# Patient Record
Sex: Male | Born: 1935 | Race: White | Hispanic: No | Marital: Married | Smoking: Former smoker
Health system: Southern US, Community
[De-identification: ages and names within clinical notes are randomized; demographics above are authoritative.]

## PROBLEM LIST (undated history)

## (undated) DIAGNOSIS — I2510 Atherosclerotic heart disease of native coronary artery without angina pectoris: Secondary | ICD-10-CM

## (undated) DIAGNOSIS — I10 Essential (primary) hypertension: Secondary | ICD-10-CM

## (undated) DIAGNOSIS — I350 Nonrheumatic aortic (valve) stenosis: Secondary | ICD-10-CM

## (undated) DIAGNOSIS — D469 Myelodysplastic syndrome, unspecified: Secondary | ICD-10-CM

## (undated) DIAGNOSIS — E119 Type 2 diabetes mellitus without complications: Secondary | ICD-10-CM

## (undated) DIAGNOSIS — I255 Ischemic cardiomyopathy: Secondary | ICD-10-CM

## (undated) HISTORY — DX: Ischemic cardiomyopathy: I25.5

## (undated) HISTORY — DX: Atherosclerotic heart disease of native coronary artery without angina pectoris: I25.10

---

## 2014-05-27 ENCOUNTER — Emergency Department: Payer: Self-pay | Admitting: Emergency Medicine

## 2014-05-27 LAB — CBC
HCT: 39.8 % — ABNORMAL LOW (ref 40.0–52.0)
HGB: 13 g/dL (ref 13.0–18.0)
MCH: 31.7 pg (ref 26.0–34.0)
MCHC: 32.6 g/dL (ref 32.0–36.0)
MCV: 97 fL (ref 80–100)
Platelet: 269 10*3/uL (ref 150–440)
RBC: 4.09 10*6/uL — ABNORMAL LOW (ref 4.40–5.90)
RDW: 14.1 % (ref 11.5–14.5)
WBC: 6.6 10*3/uL (ref 3.8–10.6)

## 2014-05-27 LAB — URINALYSIS, COMPLETE
BILIRUBIN, UR: NEGATIVE
BLOOD: NEGATIVE
Bacteria: NONE SEEN
GLUCOSE, UR: NEGATIVE mg/dL (ref 0–75)
KETONE: NEGATIVE
LEUKOCYTE ESTERASE: NEGATIVE
NITRITE: NEGATIVE
PH: 6 (ref 4.5–8.0)
PROTEIN: NEGATIVE
SPECIFIC GRAVITY: 1.009 (ref 1.003–1.030)
SQUAMOUS EPITHELIAL: NONE SEEN
WBC UR: <1 /HPF (ref 0–5)

## 2014-05-27 LAB — COMPREHENSIVE METABOLIC PANEL
ALBUMIN: 3.5 g/dL (ref 3.4–5.0)
ANION GAP: 6 — AB (ref 7–16)
Alkaline Phosphatase: 113 U/L
BUN: 18 mg/dL (ref 7–18)
Bilirubin,Total: 0.7 mg/dL (ref 0.2–1.0)
CALCIUM: 9.1 mg/dL (ref 8.5–10.1)
CHLORIDE: 106 mmol/L (ref 98–107)
CO2: 28 mmol/L (ref 21–32)
CREATININE: 1.24 mg/dL (ref 0.60–1.30)
EGFR (African American): >60
GFR CALC NON AF AMER: 59 — AB
GLUCOSE: 166 mg/dL — AB (ref 65–99)
Osmolality: 285 (ref 275–301)
Potassium: 3.8 mmol/L (ref 3.5–5.1)
SGOT(AST): 25 U/L (ref 15–37)
SGPT (ALT): 23 U/L
Sodium: 140 mmol/L (ref 136–145)
Total Protein: 7.2 g/dL (ref 6.4–8.2)

## 2014-05-27 LAB — PROTIME-INR
INR: 1.1
PROTHROMBIN TIME: 14.3 s (ref 11.5–14.7)

## 2014-05-27 LAB — APTT: Activated PTT: 29.5 secs (ref 23.6–35.9)

## 2014-05-27 LAB — TROPONIN I

## 2014-10-30 ENCOUNTER — Emergency Department: Payer: Self-pay | Admitting: Emergency Medicine

## 2017-08-13 ENCOUNTER — Emergency Department: Payer: Medicare Other

## 2017-08-13 ENCOUNTER — Other Ambulatory Visit: Payer: Self-pay

## 2017-08-13 ENCOUNTER — Encounter: Payer: Self-pay | Admitting: Emergency Medicine

## 2017-08-13 ENCOUNTER — Emergency Department
Admission: EM | Admit: 2017-08-13 | Discharge: 2017-08-13 | Disposition: A | Discharge status: Home / Self Care | Payer: Medicare Other | Source: Home / Self Care | Attending: Emergency Medicine | Admitting: Emergency Medicine

## 2017-08-13 DIAGNOSIS — Z79899 Other long term (current) drug therapy: Secondary | ICD-10-CM

## 2017-08-13 DIAGNOSIS — I10 Essential (primary) hypertension: Secondary | ICD-10-CM

## 2017-08-13 DIAGNOSIS — J189 Pneumonia, unspecified organism: Secondary | ICD-10-CM | POA: Insufficient documentation

## 2017-08-13 DIAGNOSIS — E119 Type 2 diabetes mellitus without complications: Secondary | ICD-10-CM

## 2017-08-13 DIAGNOSIS — Z791 Long term (current) use of non-steroidal anti-inflammatories (NSAID): Secondary | ICD-10-CM

## 2017-08-13 DIAGNOSIS — Z87891 Personal history of nicotine dependence: Secondary | ICD-10-CM | POA: Insufficient documentation

## 2017-08-13 HISTORY — DX: Myelodysplastic syndrome, unspecified: D46.9

## 2017-08-13 LAB — COMPREHENSIVE METABOLIC PANEL
ALT: 12 U/L — ABNORMAL LOW (ref 17–63)
ANION GAP: 7 (ref 5–15)
AST: 29 U/L (ref 15–41)
Albumin: 3.8 g/dL (ref 3.5–5.0)
Alkaline Phosphatase: 98 U/L (ref 38–126)
BUN: 20 mg/dL (ref 6–20)
CHLORIDE: 103 mmol/L (ref 101–111)
CO2: 26 mmol/L (ref 22–32)
Calcium: 9.3 mg/dL (ref 8.9–10.3)
Creatinine, Ser: 1 mg/dL (ref 0.61–1.24)
Glucose, Bld: 120 mg/dL — ABNORMAL HIGH (ref 65–99)
POTASSIUM: 4.8 mmol/L (ref 3.5–5.1)
Sodium: 136 mmol/L (ref 135–145)
TOTAL PROTEIN: 7.3 g/dL (ref 6.5–8.1)
Total Bilirubin: 1 mg/dL (ref 0.3–1.2)

## 2017-08-13 LAB — CBC
HCT: 30.1 % — ABNORMAL LOW (ref 40.0–52.0)
Hemoglobin: 10 g/dL — ABNORMAL LOW (ref 13.0–18.0)
MCH: 36.1 pg — AB (ref 26.0–34.0)
MCHC: 33.1 g/dL (ref 32.0–36.0)
MCV: 109.1 fL — AB (ref 80.0–100.0)
PLATELETS: 119 10*3/uL — AB (ref 150–440)
RBC: 2.76 MIL/uL — AB (ref 4.40–5.90)
RDW: 18 % — AB (ref 11.5–14.5)
WBC: 1.9 10*3/uL — ABNORMAL LOW (ref 3.8–10.6)

## 2017-08-13 LAB — TROPONIN I

## 2017-08-13 LAB — INFLUENZA PANEL BY PCR (TYPE A & B)
INFLAPCR: NEGATIVE
INFLBPCR: NEGATIVE

## 2017-08-13 MED ORDER — AZITHROMYCIN 250 MG PO TABS: ORAL_TABLET | ORAL | 0 refills | Status: DC

## 2017-08-13 MED ORDER — HALOPERIDOL LACTATE 5 MG/ML IJ SOLN
2.0000 mg | Freq: Once | INTRAMUSCULAR | Status: AC
  Administered 2017-08-13: 2 mg via INTRAVENOUS
  Filled 2017-08-13: qty 1

## 2017-08-13 MED ORDER — IBUPROFEN 600 MG PO TABS
600.0000 mg | ORAL_TABLET | Freq: Once | ORAL | Status: AC
  Administered 2017-08-13: 600 mg via ORAL
  Filled 2017-08-13: qty 1

## 2017-08-13 MED ORDER — ALBUTEROL SULFATE (2.5 MG/3ML) 0.083% IN NEBU
5.0000 mg | INHALATION_SOLUTION | Freq: Once | RESPIRATORY_TRACT | Status: AC
  Administered 2017-08-13: 5 mg via RESPIRATORY_TRACT

## 2017-08-13 MED ORDER — ACETAMINOPHEN 500 MG PO TABS
1000.0000 mg | ORAL_TABLET | Freq: Once | ORAL | Status: AC
Start: 2017-08-13 — End: 2017-08-13
  Administered 2017-08-13: 1000 mg via ORAL
  Filled 2017-08-13: qty 2

## 2017-08-13 MED ORDER — IOPAMIDOL (ISOVUE-370) INJECTION 76%
75.0000 mL | Freq: Once | INTRAVENOUS | Status: AC | PRN
  Administered 2017-08-13: 75 mL via INTRAVENOUS

## 2017-08-13 MED ORDER — ALBUTEROL SULFATE (2.5 MG/3ML) 0.083% IN NEBU
INHALATION_SOLUTION | RESPIRATORY_TRACT | Status: AC
  Administered 2017-08-13: 5 mg via RESPIRATORY_TRACT
  Filled 2017-08-13: qty 6

## 2017-08-13 NOTE — ED Triage Notes (Signed)
Patient noted in triage to try to cough up phlegm, weak cough effort and struggled for minute to be able to clear airway. Resolved without suction.

## 2017-08-13 NOTE — ED Notes (Signed)
ED Provider at bedside. 

## 2017-08-13 NOTE — ED Notes (Signed)
Patient transported to CT 

## 2017-08-13 NOTE — Discharge Instructions (Signed)
Please take all of your antibiotics as prescribed and follow-up with your primary care physician as needed.  Please make sure you remain well-hydrated and return to the emergency department for any concerns such as fevers, chills, worsening shortness of breath if you cannot eat or drink, or for any other issues whatsoever.  It was a pleasure to take care of you today, and thank you for coming to our emergency department.  If you have any questions or concerns before leaving please ask the nurse to grab me and I'm more than happy to go through your aftercare instructions again.  If you were prescribed any opioid pain medication today such as Norco, Vicodin, Percocet, morphine, hydrocodone, or oxycodone please make sure you do not drive when you are taking this medication as it can alter your ability to drive safely.  If you have any concerns once you are home that you are not improving or are in fact getting worse before you can make it to your follow-up appointment, please do not hesitate to call 911 and come back for further evaluation.  Darel Hong, MD  Results for orders placed or performed during the hospital encounter of 08/13/17  CBC  Result Value Ref Range   WBC 1.9 (L) 3.8 - 10.6 K/uL   RBC 2.76 (L) 4.40 - 5.90 MIL/uL   Hemoglobin 10.0 (L) 13.0 - 18.0 g/dL   HCT 30.1 (L) 40.0 - 52.0 %   MCV 109.1 (H) 80.0 - 100.0 fL   MCH 36.1 (H) 26.0 - 34.0 pg   MCHC 33.1 32.0 - 36.0 g/dL   RDW 18.0 (H) 11.5 - 14.5 %   Platelets 119 (L) 150 - 440 K/uL  Troponin I  Result Value Ref Range   Troponin I <0.03 <0.03 ng/mL  Comprehensive metabolic panel  Result Value Ref Range   Sodium 136 135 - 145 mmol/L   Potassium 4.8 3.5 - 5.1 mmol/L   Chloride 103 101 - 111 mmol/L   CO2 26 22 - 32 mmol/L   Glucose, Bld 120 (H) 65 - 99 mg/dL   BUN 20 6 - 20 mg/dL   Creatinine, Ser 1.00 0.61 - 1.24 mg/dL   Calcium 9.3 8.9 - 10.3 mg/dL   Total Protein 7.3 6.5 - 8.1 g/dL   Albumin 3.8 3.5 - 5.0 g/dL   AST 29 15 - 41 U/L   ALT 12 (L) 17 - 63 U/L   Alkaline Phosphatase 98 38 - 126 U/L   Total Bilirubin 1.0 0.3 - 1.2 mg/dL   GFR calc non Af Amer >60 >60 mL/min   GFR calc Af Amer >60 >60 mL/min   Anion gap 7 5 - 15  Influenza panel by PCR (type A & B)  Result Value Ref Range   Influenza A By PCR NEGATIVE NEGATIVE   Influenza B By PCR NEGATIVE NEGATIVE   Dg Chest 2 View  Result Date: 08/13/2017 CLINICAL DATA:  84 year old male with a history of shortness of breath EXAM: CHEST  2 VIEW COMPARISON:  10/30/2014, 05/27/2014 FINDINGS: Cardiomediastinal silhouette unchanged in size and contour. No evidence of central vascular congestion. Compare to the prior there is increased interstitial opacities, particularly at the right lung base. No pneumothorax. No confluent airspace disease. No pleural effusion. Coronary calcifications. IMPRESSION: Coarse interstitial markings may reflect either early edema, chronic scarring, or potentially atypical infection. No evidence of lobar pneumonia. Electronically Signed   By: Corrie Mckusick D.O.   On: 08/13/2017 14:42   Ct Angio Chest  Pe W/cm &/or Wo Cm  Result Date: 08/13/2017 CLINICAL DATA:  Flu like symptoms for the past 3 days with nonproductive an week cough. Worsening shortness of breath overnight. Evaluate pulmonary embolism. EXAM: CT ANGIOGRAPHY CHEST WITH CONTRAST TECHNIQUE: Multidetector CT imaging of the chest was performed using the standard protocol during bolus administration of intravenous contrast. Multiplanar CT image reconstructions and MIPs were obtained to evaluate the vascular anatomy. CONTRAST:  20mL ISOVUE-370 IOPAMIDOL (ISOVUE-370) INJECTION 76% COMPARISON:  Chest x-ray from same day. FINDINGS: Cardiovascular: Satisfactory opacification of the pulmonary arteries to the segmental level. No evidence of pulmonary embolism. Normal heart size. No pericardial effusion. Normal caliber thoracic aorta. Coronary, aortic arch, and branch vessel atherosclerotic  vascular disease. Mediastinum/Nodes: Multiple subcentimeter mediastinal lymph nodes are likely reactive. No enlarged hilar, or axillary lymph nodes. Mildly patulous esophagus. Thyroid gland and trachea demonstrate no significant findings. Lungs/Pleura: Patchy increased peribronchovascular nodularity and interlobular septal thickening in the right greater than left lower lobes. No focal consolidation, pleural effusion, or pneumothorax. Upper Abdomen: No acute abnormality. Musculoskeletal: No chest wall abnormality. No acute or significant osseous findings. Degenerative changes of the thoracic spine. Review of the MIP images confirms the above findings. IMPRESSION: 1. No evidence of pulmonary embolism. 2. Patchy increased peribronchovascular nodularity in the right greater than left lower lobes, likely related to atypical infection or aspiration, especially in the setting of a somewhat patulous esophagus. 3.  Aortic atherosclerosis (ICD10-I70.0). Electronically Signed   By: Titus Dubin M.D.   On: 08/13/2017 16:36

## 2017-08-13 NOTE — ED Provider Notes (Signed)
[facility] Emergency Department Provider Note  ____________________________________________   First MD Initiated Contact with Patient 08/13/17 1459     (approximate)  I have reviewed the triage vital signs and the nursing notes.   HISTORY  Chief Complaint Cough and Chest Pain   HPI Terry Gonzales is a 84 y.o. male who comes to the emergency department with 3 days of upper respiratory tract symptoms.  He has had nasal congestion and dry cough.  Patient was concerned he might have influenza so he has been taking amoxicillin that he had left around the house without improvement in his symptoms.  He has had subjective fever but no chills.  He also reports sore throat.  He has a past medical history of myelodysplastic syndrome.  He has mild diffuse upper nonradiating chest pain worse with cough improved when not coughing.  He has no pulmonary issues at baseline.  Past Medical History:  Diagnosis Date  . Diabetes mellitus without complication ([place])   . Hypertension   . Myelodysplastic syndrome [facility])     Patient Active Problem List   Diagnosis Date Noted  . Pneumonia 08/14/2017    History reviewed. No pertinent surgical history.  Prior to Admission medications   Medication Sig Start Date End Date Taking? Authorizing Provider  azithromycin (ZITHROMAX Z-PAK) 250 MG tablet Take 2 tablets (500 mg) on  Day 1,  followed by 1 tablet (250 mg) once daily on Days 2 through 5. 08/13/17   Darel Hong, MD  lisinopril (PRINIVIL,ZESTRIL) 20 MG tablet Take 20 mg by mouth daily. 05/06/17 05/06/18  [provider]  Multiple Vitamin (MULTI-VITAMINS) TABS Take 1 tablet by mouth daily. 02/24/03   [provider]  naproxen sodium (ALEVE) 220 MG tablet Take 220 mg by mouth daily. 02/24/03   [provider]  Omega-3 1000 MG CAPS Take 1,000 mg by mouth daily. 09/18/11   [provider]  simvastatin (ZOCOR) 10 MG tablet Take 10 mg by mouth daily.  05/06/17   [provider]    Allergies Penicillins  No family history on file.  Social History Social History   Tobacco Use  . Smoking status: Former Smoker  Substance Use Topics  . Alcohol use: No    Frequency: Never  . Drug use: Not on file    Review of Systems Constitutional: Positive for subjective fever Eyes: No visual changes. ENT: Positive for sore throat. Cardiovascular: Positive for chest pain. Respiratory: Positive for shortness of breath. Gastrointestinal: No abdominal pain.  No nausea, no vomiting.  No diarrhea.  No constipation. Genitourinary: Negative for dysuria. Musculoskeletal: Negative for back pain. Skin: Negative for rash. Neurological: Negative for headaches, focal weakness or numbness.   ____________________________________________   PHYSICAL EXAM:  VITAL SIGNS: ED Triage Vitals  Enc Vitals Group     BP 08/13/17 1354 (!) 143/72     Pulse Rate 08/13/17 1354 76     Resp --      Temp 08/13/17 1354 98.3 F (36.8 C)     Temp Source 08/13/17 1354 Oral     SpO2 08/13/17 1354 96 %     Weight 08/13/17 1355 160 lb (72.6 kg)     Height 08/13/17 1355 6' (1.829 m)     Head Circumference --      Peak Flow --      Pain Score 08/13/17 1354 10     Pain Loc --      Pain Edu? --      Excl.  in [place]? --     Constitutional: Appears to be having a panic attack hyperventilating tearful speaking in short sentences Eyes: PERRL EOMI. Head: Atraumatic. Nose: No congestion/rhinnorhea. Mouth/Throat: No trismus Neck: No stridor.   Cardiovascular: Tachycardic rate, regular rhythm. Grossly normal heart sounds.  Good peripheral circulation. Respiratory: Normal respiratory effort.  No retractions. Lungs CTAB and moving good air lungs are completely clear Gastrointestinal: Soft nontender Musculoskeletal: No lower extremity edema   Neurologic:  Normal speech and language. No gross focal neurologic deficits are appreciated. Skin:  Skin is warm, dry and  intact. No rash noted. Psychiatric: Anxious appearing    ____________________________________________   DIFFERENTIAL includes but not limited to  Pneumonia, pneumothorax, pulmonary embolism, influenza, influenza-like illness ____________________________________________   LABS (all labs ordered are listed, but only abnormal results are displayed)  Labs Reviewed  CBC - Abnormal; Notable for the following components:      Result Value   WBC 1.9 (*)    RBC 2.76 (*)    Hemoglobin 10.0 (*)    HCT 30.1 (*)    MCV 109.1 (*)    MCH 36.1 (*)    RDW 18.0 (*)    Platelets 119 (*)    All other components within normal limits  COMPREHENSIVE METABOLIC PANEL - Abnormal; Notable for the following components:   Glucose, Bld 120 (*)    ALT 12 (*)    All other components within normal limits  TROPONIN I  INFLUENZA PANEL BY PCR (TYPE A & B)    Lab work reviewed by me with slightly low white count which is consistent with his known myelodysplastic syndrome  EKG __________________________________________  ED ECG REPORT I, Darel Hong, the attending physician, personally viewed and interpreted this ECG.  Date: 08/13/2017 EKG Time:  Rate: 76 Rhythm: normal sinus rhythm QRS Axis: normal Intervals: normal ST/T Wave abnormalities: normal Narrative Interpretation: Significant amount of artifact makes interpretation of the EKG difficult with the patient appears to be in normal sinus rhythm with no acute ischemia  ____________________________________________  RADIOLOGY  Chest x-ray reviewed by me is nonspecific but could be consistent with infection ____________ CT pulmonary angiogram consistent with atypical infection ________________________________   PROCEDURES  Procedure(s) performed: no  Procedures  Critical Care performed: no  Observation: no ____________________________________________   INITIAL IMPRESSION / ASSESSMENT AND PLAN / ED COURSE  Pertinent labs &  imaging results that were available during my care of the patient were reviewed by me and considered in my medical decision making (see chart for details).  On arrival the patient appears to be having a panic attack.  Given 2 mg of haloperidol intravenously with improvement in his symptoms.  I think his anxiety is exacerbated by the albuterol he was given in triage.  Chest x-ray is nonspecific so CT pulmonary angiogram is pending.  CT is most concerning for atypical infection.  As the patient has a low-grade fever think is reasonable to treat him for possible pneumonia.  He does not have influenza.  I had a lengthy discussion with the patient and family at bedside regarding the diagnostic uncertainty and that it should he not improve with antibiotics he is to return for IV antibiotics and further care.  The patient is family verbalized understanding agreement the plan      ____________________________________________   FINAL CLINICAL IMPRESSION(S) / ED DIAGNOSES  Final diagnoses:  Community acquired pneumonia, unspecified laterality      NEW MEDICATIONS STARTED DURING THIS VISIT:  This SmartLink is deprecated. Use  AVSMEDLIST instead to display the medication list for a patient.   Note:  This document was prepared using Dragon voice recognition software and may include unintentional dictation errors.     Darel Hong, MD 08/14/17 2205

## 2017-08-13 NOTE — ED Triage Notes (Signed)
Sore throat cough and chest pain began 2 days ago.

## 2017-08-13 NOTE — ED Notes (Signed)
Pt daughter out to desk, states patient is crying and cannot breathe. Pt appears anxious when entering room. Reassured that oxygen is 100% on RA. Placed on 2L [place] for comfort. EDP notified.

## 2017-08-13 NOTE — ED Notes (Signed)
Report to Ashley, RN

## 2017-08-13 NOTE — ED Triage Notes (Signed)
First nurse:  Sick with cough for several days.

## 2017-08-13 NOTE — ED Notes (Signed)
Flu like sx x 3 days, nonproductive and weak cough. SOB worsening overnight. Pt reporting lower center chest pain. Pt alert and oriented X4, active, cooperative, pt in NAD. Color WNL.

## 2017-08-14 ENCOUNTER — Emergency Department: Payer: Medicare Other

## 2017-08-14 ENCOUNTER — Inpatient Hospital Stay
Admission: EM | Admit: 2017-08-14 | Discharge: 2017-08-19 | DRG: 871 | Disposition: A | Discharge status: Home Health | Payer: Medicare Other | Attending: Internal Medicine | Admitting: Internal Medicine

## 2017-08-14 ENCOUNTER — Other Ambulatory Visit: Payer: Self-pay

## 2017-08-14 DIAGNOSIS — J9601 Acute respiratory failure with hypoxia: Secondary | ICD-10-CM | POA: Diagnosis present

## 2017-08-14 DIAGNOSIS — I252 Old myocardial infarction: Secondary | ICD-10-CM

## 2017-08-14 DIAGNOSIS — N179 Acute kidney failure, unspecified: Secondary | ICD-10-CM

## 2017-08-14 DIAGNOSIS — D638 Anemia in other chronic diseases classified elsewhere: Secondary | ICD-10-CM | POA: Diagnosis present

## 2017-08-14 DIAGNOSIS — Z79899 Other long term (current) drug therapy: Secondary | ICD-10-CM | POA: Diagnosis not present

## 2017-08-14 DIAGNOSIS — J181 Lobar pneumonia, unspecified organism: Secondary | ICD-10-CM

## 2017-08-14 DIAGNOSIS — D6959 Other secondary thrombocytopenia: Secondary | ICD-10-CM | POA: Diagnosis present

## 2017-08-14 DIAGNOSIS — E785 Hyperlipidemia, unspecified: Secondary | ICD-10-CM | POA: Diagnosis present

## 2017-08-14 DIAGNOSIS — D649 Anemia, unspecified: Secondary | ICD-10-CM | POA: Diagnosis not present

## 2017-08-14 DIAGNOSIS — I5041 Acute combined systolic (congestive) and diastolic (congestive) heart failure: Secondary | ICD-10-CM | POA: Diagnosis present

## 2017-08-14 DIAGNOSIS — E860 Dehydration: Secondary | ICD-10-CM | POA: Diagnosis present

## 2017-08-14 DIAGNOSIS — I10 Essential (primary) hypertension: Secondary | ICD-10-CM | POA: Diagnosis not present

## 2017-08-14 DIAGNOSIS — E119 Type 2 diabetes mellitus without complications: Secondary | ICD-10-CM | POA: Diagnosis present

## 2017-08-14 DIAGNOSIS — A419 Sepsis, unspecified organism: Principal | ICD-10-CM | POA: Diagnosis present

## 2017-08-14 DIAGNOSIS — I361 Nonrheumatic tricuspid (valve) insufficiency: Secondary | ICD-10-CM | POA: Diagnosis not present

## 2017-08-14 DIAGNOSIS — I214 Non-ST elevation (NSTEMI) myocardial infarction: Secondary | ICD-10-CM | POA: Diagnosis present

## 2017-08-14 DIAGNOSIS — I350 Nonrheumatic aortic (valve) stenosis: Secondary | ICD-10-CM | POA: Diagnosis present

## 2017-08-14 DIAGNOSIS — Z880 Allergy status to penicillin: Secondary | ICD-10-CM

## 2017-08-14 DIAGNOSIS — D469 Myelodysplastic syndrome, unspecified: Secondary | ICD-10-CM | POA: Diagnosis present

## 2017-08-14 DIAGNOSIS — Z87891 Personal history of nicotine dependence: Secondary | ICD-10-CM | POA: Diagnosis not present

## 2017-08-14 DIAGNOSIS — Z7982 Long term (current) use of aspirin: Secondary | ICD-10-CM | POA: Diagnosis not present

## 2017-08-14 DIAGNOSIS — R0603 Acute respiratory distress: Secondary | ICD-10-CM | POA: Diagnosis not present

## 2017-08-14 DIAGNOSIS — J9600 Acute respiratory failure, unspecified whether with hypoxia or hypercapnia: Secondary | ICD-10-CM

## 2017-08-14 DIAGNOSIS — J189 Pneumonia, unspecified organism: Secondary | ICD-10-CM | POA: Diagnosis present

## 2017-08-14 DIAGNOSIS — I110 Hypertensive heart disease with heart failure: Secondary | ICD-10-CM | POA: Diagnosis present

## 2017-08-14 DIAGNOSIS — I2510 Atherosclerotic heart disease of native coronary artery without angina pectoris: Secondary | ICD-10-CM | POA: Diagnosis present

## 2017-08-14 DIAGNOSIS — I2109 ST elevation (STEMI) myocardial infarction involving other coronary artery of anterior wall: Secondary | ICD-10-CM | POA: Diagnosis not present

## 2017-08-14 DIAGNOSIS — E7800 Pure hypercholesterolemia, unspecified: Secondary | ICD-10-CM | POA: Diagnosis not present

## 2017-08-14 DIAGNOSIS — I255 Ischemic cardiomyopathy: Secondary | ICD-10-CM | POA: Diagnosis not present

## 2017-08-14 DIAGNOSIS — R079 Chest pain, unspecified: Secondary | ICD-10-CM | POA: Diagnosis present

## 2017-08-14 HISTORY — DX: Type 2 diabetes mellitus without complications: E11.9

## 2017-08-14 HISTORY — DX: Essential (primary) hypertension: I10

## 2017-08-14 LAB — CBC
HEMATOCRIT: 29.1 % — AB (ref 40.0–52.0)
Hemoglobin: 9.6 g/dL — ABNORMAL LOW (ref 13.0–18.0)
MCH: 36.3 pg — AB (ref 26.0–34.0)
MCHC: 33.1 g/dL (ref 32.0–36.0)
MCV: 109.7 fL — ABNORMAL HIGH (ref 80.0–100.0)
Platelets: 87 10*3/uL — ABNORMAL LOW (ref 150–440)
RBC: 2.65 MIL/uL — ABNORMAL LOW (ref 4.40–5.90)
RDW: 17.5 % — ABNORMAL HIGH (ref 11.5–14.5)
WBC: 5.6 10*3/uL (ref 3.8–10.6)

## 2017-08-14 LAB — BASIC METABOLIC PANEL
Anion gap: 9 (ref 5–15)
BUN: 30 mg/dL — AB (ref 6–20)
CALCIUM: 8.9 mg/dL (ref 8.9–10.3)
CO2: 22 mmol/L (ref 22–32)
Chloride: 102 mmol/L (ref 101–111)
Creatinine, Ser: 1.28 mg/dL — ABNORMAL HIGH (ref 0.61–1.24)
GFR calc Af Amer: 55 mL/min — ABNORMAL LOW (ref >60)
GFR calc non Af Amer: 48 mL/min — ABNORMAL LOW (ref >60)
GLUCOSE: 136 mg/dL — AB (ref 65–99)
Potassium: 4.4 mmol/L (ref 3.5–5.1)
Sodium: 133 mmol/L — ABNORMAL LOW (ref 135–145)

## 2017-08-14 LAB — LACTIC ACID, PLASMA: LACTIC ACID, VENOUS: 1.5 mmol/L (ref 0.5–1.9)

## 2017-08-14 LAB — TROPONIN I: TROPONIN I: 3.22 ng/mL — AB (ref <0.03)

## 2017-08-14 MED ORDER — ACETAMINOPHEN 650 MG RE SUPP: 650.0000 mg | Freq: Four times a day (QID) | RECTAL | Status: DC | PRN

## 2017-08-14 MED ORDER — HYDROCODONE-ACETAMINOPHEN 5-325 MG PO TABS: 1.0000 | ORAL_TABLET | ORAL | Status: DC | PRN

## 2017-08-14 MED ORDER — GUAIFENESIN 100 MG/5ML PO SOLN
10.0000 mL | ORAL | Status: DC | PRN
  Administered 2017-08-14 – 2017-08-17 (×6): 200 mg via ORAL
  Filled 2017-08-14 (×9): qty 10

## 2017-08-14 MED ORDER — SODIUM CHLORIDE 0.9 % IV BOLUS (SEPSIS)
1000.0000 mL | Freq: Once | INTRAVENOUS | Status: AC
  Administered 2017-08-14: 1000 mL via INTRAVENOUS

## 2017-08-14 MED ORDER — SODIUM CHLORIDE 0.9 % IV SOLN
Freq: Once | INTRAVENOUS | Status: AC
  Administered 2017-08-14: 23:00:00 via INTRAVENOUS

## 2017-08-14 MED ORDER — SIMVASTATIN 20 MG PO TABS
10.0000 mg | ORAL_TABLET | Freq: Every day | ORAL | Status: DC
  Administered 2017-08-15 – 2017-08-19 (×4): 10 mg via ORAL
  Filled 2017-08-14 (×4): qty 1

## 2017-08-14 MED ORDER — NITROGLYCERIN 2 % TD OINT
TOPICAL_OINTMENT | TRANSDERMAL | Status: AC
  Filled 2017-08-14: qty 1

## 2017-08-14 MED ORDER — NAPROXEN 250 MG PO TABS
250.0000 mg | ORAL_TABLET | Freq: Every day | ORAL | Status: DC
  Administered 2017-08-15 – 2017-08-19 (×4): 250 mg via ORAL
  Filled 2017-08-14 (×5): qty 1

## 2017-08-14 MED ORDER — DOCUSATE SODIUM 100 MG PO CAPS
100.0000 mg | ORAL_CAPSULE | Freq: Two times a day (BID) | ORAL | Status: DC
  Administered 2017-08-14 – 2017-08-19 (×9): 100 mg via ORAL
  Filled 2017-08-14 (×9): qty 1

## 2017-08-14 MED ORDER — HEPARIN BOLUS VIA INFUSION
4000.0000 [IU] | Freq: Once | INTRAVENOUS | Status: AC
  Administered 2017-08-14: 4000 [IU] via INTRAVENOUS
  Filled 2017-08-14: qty 4000

## 2017-08-14 MED ORDER — LEVOFLOXACIN IN D5W 250 MG/50ML IV SOLN
250.0000 mg | INTRAVENOUS | Status: DC
  Administered 2017-08-15 – 2017-08-17 (×3): 250 mg via INTRAVENOUS
  Filled 2017-08-14 (×3): qty 50

## 2017-08-14 MED ORDER — BISACODYL 5 MG PO TBEC: 5.0000 mg | DELAYED_RELEASE_TABLET | Freq: Every day | ORAL | Status: DC | PRN

## 2017-08-14 MED ORDER — TRAZODONE HCL 50 MG PO TABS
25.0000 mg | ORAL_TABLET | Freq: Every evening | ORAL | Status: DC | PRN
  Administered 2017-08-14 – 2017-08-16 (×2): 25 mg via ORAL
  Filled 2017-08-14 (×3): qty 1

## 2017-08-14 MED ORDER — NITROGLYCERIN 2 % TD OINT
1.0000 [in_us] | TOPICAL_OINTMENT | Freq: Once | TRANSDERMAL | Status: AC
  Administered 2017-08-14: 1 [in_us] via TOPICAL

## 2017-08-14 MED ORDER — ONDANSETRON HCL 4 MG/2ML IJ SOLN: 4.0000 mg | Freq: Four times a day (QID) | INTRAMUSCULAR | Status: DC | PRN

## 2017-08-14 MED ORDER — LISINOPRIL 20 MG PO TABS
20.0000 mg | ORAL_TABLET | Freq: Every day | ORAL | Status: DC
  Administered 2017-08-14: 20 mg via ORAL
  Filled 2017-08-14: qty 1

## 2017-08-14 MED ORDER — ASPIRIN 81 MG PO CHEW
324.0000 mg | CHEWABLE_TABLET | Freq: Once | ORAL | Status: AC
  Administered 2017-08-14: 324 mg via ORAL
  Filled 2017-08-14: qty 4

## 2017-08-14 MED ORDER — LEVOFLOXACIN IN D5W 750 MG/150ML IV SOLN
750.0000 mg | Freq: Once | INTRAVENOUS | Status: AC
  Administered 2017-08-14: 750 mg via INTRAVENOUS
  Filled 2017-08-14: qty 150

## 2017-08-14 MED ORDER — ADULT MULTIVITAMIN W/MINERALS CH
1.0000 | ORAL_TABLET | Freq: Every day | ORAL | Status: DC
  Administered 2017-08-14 – 2017-08-19 (×5): 1 via ORAL
  Filled 2017-08-14 (×5): qty 1

## 2017-08-14 MED ORDER — OMEGA-3-ACID ETHYL ESTERS 1 G PO CAPS
1000.0000 mg | ORAL_CAPSULE | Freq: Every day | ORAL | Status: DC
  Administered 2017-08-15 – 2017-08-19 (×4): 1000 mg via ORAL
  Filled 2017-08-14 (×4): qty 1

## 2017-08-14 MED ORDER — HEPARIN (PORCINE) IN NACL 100-0.45 UNIT/ML-% IJ SOLN
1250.0000 [IU]/h | INTRAMUSCULAR | Status: DC
  Administered 2017-08-14: 850 [IU]/h via INTRAVENOUS
  Administered 2017-08-15: 1000 [IU]/h via INTRAVENOUS
  Filled 2017-08-14 (×2): qty 250

## 2017-08-14 MED ORDER — ONDANSETRON HCL 4 MG PO TABS: 4.0000 mg | ORAL_TABLET | Freq: Four times a day (QID) | ORAL | Status: DC | PRN

## 2017-08-14 MED ORDER — ASPIRIN EC 81 MG PO TBEC
81.0000 mg | DELAYED_RELEASE_TABLET | Freq: Every day | ORAL | Status: DC
  Administered 2017-08-15 – 2017-08-19 (×5): 81 mg via ORAL
  Filled 2017-08-14 (×4): qty 1

## 2017-08-14 MED ORDER — ACETAMINOPHEN 325 MG PO TABS
650.0000 mg | ORAL_TABLET | Freq: Four times a day (QID) | ORAL | Status: DC | PRN
  Administered 2017-08-16 – 2017-08-17 (×2): 650 mg via ORAL
  Filled 2017-08-14 (×2): qty 2

## 2017-08-14 NOTE — H&P (Signed)
[facility] NAME: Terry Gonzales    MR#:  [id number]  DATE OF BIRTH:  October 31, 1924  DATE OF ADMISSION:  08/14/2017  PRIMARY CARE PHYSICIAN: [facility]   REQUESTING/REFERRING PHYSICIAN:   CHIEF COMPLAINT:  COUGH AND CP Chief Complaint  Patient presents with  . Pneumonia    HISTORY OF PRESENT ILLNESS: Terry Gonzales  is a 84 y.o. male with a known history of diabetes hypertension and myelodysplastic syndrome.  He was brought to the hospital for cough and chest pain.  Patient describes the chest pain As retrosternal brought on by cough 6 out of 10 in severity without any radiation.  Per patient the pain is not worsening with physical activity.  He has been coughing with yellow sputum for the past 3 days.  Patient denies any fever or chills at home no sick contacts.  While in the emergency room he was found with elevated troponin level.  Patient is admitted for further evaluation and treatment.  PAST MEDICAL HISTORY:   Past Medical History:  Diagnosis Date  . Diabetes mellitus without complication ([place])   . Hypertension   . Myelodysplastic syndrome ([place])     PAST SURGICAL HISTORY: History reviewed. No pertinent surgical history.  SOCIAL HISTORY:  Social History   Tobacco Use  . Smoking status: Former Smoker  Substance Use Topics  . Alcohol use: No    Frequency: Never    FAMILY HISTORY: History reviewed. No pertinent family history.  DRUG ALLERGIES:  Allergies  Allergen Reactions  . Penicillins Swelling    Has patient had a PCN reaction causing immediate rash, facial/tongue/throat swelling, SOB or lightheadedness with hypotension: Yes Has patient had a PCN reaction causing severe rash involving mucus membranes or skin necrosis: No Has patient had a PCN reaction that required hospitalization: No Has patient had a PCN reaction occurring within the last 10 years: No If all of the above answers are "NO",  then may proceed with Cephalosporin use.    REVIEW OF SYSTEMS:   CONSTITUTIONAL: No fever, fatigue or weakness.  EYES: No blurred or double vision.  EARS, NOSE, AND THROAT: No tinnitus or ear pain.  RESPIRATORY: No cough, shortness of breath, wheezing or hemoptysis.  CARDIOVASCULAR: No chest pain, orthopnea, edema.  GASTROINTESTINAL: No nausea, vomiting, diarrhea or abdominal pain.  GENITOURINARY: No dysuria, hematuria.  ENDOCRINE: No polyuria, nocturia,  HEMATOLOGY: No anemia, easy bruising or bleeding SKIN: No rash or lesion. MUSCULOSKELETAL: No joint pain or arthritis.   NEUROLOGIC: No tingling, numbness, weakness.  PSYCHIATRY: No anxiety or depression.   MEDICATIONS AT HOME:  Prior to Admission medications   Medication Sig Start Date End Date Taking? Authorizing Provider  azithromycin (ZITHROMAX Z-PAK) 250 MG tablet Take 2 tablets (500 mg) on  Day 1,  followed by 1 tablet (250 mg) once daily on Days 2 through 5. 08/13/17  Yes Rifenbark, Milta Deiters, MD  lisinopril (PRINIVIL,ZESTRIL) 20 MG tablet Take 20 mg by mouth daily. 05/06/17 05/06/18 Yes [provider]  Multiple Vitamin (MULTI-VITAMINS) TABS Take 1 tablet by mouth daily. 02/24/03  Yes [provider]  naproxen sodium (ALEVE) 220 MG tablet Take 220 mg by mouth daily. 02/24/03  Yes [provider]  Omega-3 1000 MG CAPS Take 1,000 mg by mouth daily. 09/18/11  Yes [provider]  simvastatin (ZOCOR) 10 MG tablet Take 10 mg by mouth daily. 05/06/17  Yes [provider]      PHYSICAL EXAMINATION:  VITAL SIGNS: Blood pressure (!) 125/49, pulse 82, temperature 98.4 F (36.9 C), temperature source Oral, resp. rate (!) 27, height 6' (1.829 m), weight 72.6 kg (160 lb), SpO2 94 %.  GENERAL:  84 y.o.-year-old patient lying in the bed with no acute distress.  EYES: Pupils equal, round, reactive to light and accommodation. No scleral icterus. Extraocular muscles intact.  HEENT: Head atraumatic,  normocephalic. Oropharynx and nasopharynx clear.  NECK:  Supple, no jugular venous distention. No thyroid enlargement, no tenderness.  LUNGS: Normal breath sounds bilaterally, no wheezing, rales,rhonchi or crepitation. No use of accessory muscles of respiration.  CARDIOVASCULAR: S1, S2 normal. No murmurs, rubs, or gallops.  ABDOMEN: Soft, nontender, nondistended. Bowel sounds present. No organomegaly or mass.  EXTREMITIES: No pedal edema, cyanosis, or clubbing.  NEUROLOGIC: Cranial nerves II through XII are intact. Muscle strength 5/5 in all extremities. Sensation intact. Gait not checked.  PSYCHIATRIC: The patient is alert and oriented x 3.  SKIN: No obvious rash, lesion, or ulcer.   LABORATORY PANEL:   CBC Recent Labs  Lab 08/13/17 1358 08/14/17 1833  WBC 1.9* 5.6  HGB 10.0* 9.6*  HCT 30.1* 29.1*  PLT 119* 87*  MCV 109.1* 109.7*  MCH 36.1* 36.3*  MCHC 33.1 33.1  RDW 18.0* 17.5*   ------------------------------------------------------------------------------------------------------------------  Chemistries  Recent Labs  Lab 08/13/17 1358 08/14/17 1833  NA 136 133*  K 4.8 4.4  CL 103 102  CO2 26 22  GLUCOSE 120* 136*  BUN 20 30*  CREATININE 1.00 1.28*  CALCIUM 9.3 8.9  AST 29  --   ALT 12*  --   ALKPHOS 98  --   BILITOT 1.0  --    ------------------------------------------------------------------------------------------------------------------ estimated creatinine clearance is 39.4 mL/min (A) (by C-G formula based on SCr of 1.28 mg/dL (H)). ------------------------------------------------------------------------------------------------------------------ No results for input(s): TSH, T4TOTAL, T3FREE, THYROIDAB in the last 72 hours.  Invalid input(s): FREET3   Coagulation profile No results for input(s): INR, PROTIME in the last 168 hours. ------------------------------------------------------------------------------------------------------------------- No  results for input(s): DDIMER in the last 72 hours. -------------------------------------------------------------------------------------------------------------------  Cardiac Enzymes Recent Labs  Lab 08/13/17 1358 08/14/17 1833  TROPONINI <0.03 3.22*   ------------------------------------------------------------------------------------------------------------------ Invalid input(s): POCBNP  ---------------------------------------------------------------------------------------------------------------  Urinalysis    Component Value Date/Time   COLORURINE Yellow 05/27/2014 1708   APPEARANCEUR Clear 05/27/2014 1708   LABSPEC 1.009 05/27/2014 1708   PHURINE 6.0 05/27/2014 1708   GLUCOSEU Negative 05/27/2014 1708   HGBUR Negative 05/27/2014 1708   BILIRUBINUR Negative 05/27/2014 1708   KETONESUR Negative 05/27/2014 1708   PROTEINUR Negative 05/27/2014 1708   NITRITE Negative 05/27/2014 1708   LEUKOCYTESUR Negative 05/27/2014 1708     RADIOLOGY: Dg Chest 2 View  Result Date: 08/14/2017 CLINICAL DATA:  Shortness of breath and cough EXAM: CHEST  2 VIEW COMPARISON:  Chest radiograph and chest CT August 13, 2017 FINDINGS: There is stable patchy opacity in the lung bases, somewhat more on the right than on the left. No new opacity evident elsewhere. Heart is upper normal in size with pulmonary vascularity within normal limits. There is aortic atherosclerosis. There is an old healed fracture of the right clavicle. There is calcification in the left anterior descending and left circumflex coronary artery. IMPRESSION: Patchy bibasilar opacity, at least in part due to atelectasis but based on CT appearance, likely with associated pneumonia and/ or aspiration. Lungs elsewhere clear. Stable cardiac silhouette. There is aortic atherosclerosis. Foci of coronary artery calcification also noted. Aortic Atherosclerosis (ICD10-I70.0). Electronically Signed   By: Gwyndolyn Saxon  Jasmine December III M.D.   On:  08/14/2017 18:58   Dg Chest 2 View  Result Date: 08/13/2017 CLINICAL DATA:  84 year old male with a history of shortness of breath EXAM: CHEST  2 VIEW COMPARISON:  10/30/2014, 05/27/2014 FINDINGS: Cardiomediastinal silhouette unchanged in size and contour. No evidence of central vascular congestion. Compare to the prior there is increased interstitial opacities, particularly at the right lung base. No pneumothorax. No confluent airspace disease. No pleural effusion. Coronary calcifications. IMPRESSION: Coarse interstitial markings may reflect either early edema, chronic scarring, or potentially atypical infection. No evidence of lobar pneumonia. Electronically Signed   By: Corrie Mckusick D.O.   On: 08/13/2017 14:42   Ct Angio Chest Pe W/cm &/or Wo Cm  Result Date: 08/13/2017 CLINICAL DATA:  Flu like symptoms for the past 3 days with nonproductive an week cough. Worsening shortness of breath overnight. Evaluate pulmonary embolism. EXAM: CT ANGIOGRAPHY CHEST WITH CONTRAST TECHNIQUE: Multidetector CT imaging of the chest was performed using the standard protocol during bolus administration of intravenous contrast. Multiplanar CT image reconstructions and MIPs were obtained to evaluate the vascular anatomy. CONTRAST:  21mL ISOVUE-370 IOPAMIDOL (ISOVUE-370) INJECTION 76% COMPARISON:  Chest x-ray from same day. FINDINGS: Cardiovascular: Satisfactory opacification of the pulmonary arteries to the segmental level. No evidence of pulmonary embolism. Normal heart size. No pericardial effusion. Normal caliber thoracic aorta. Coronary, aortic arch, and branch vessel atherosclerotic vascular disease. Mediastinum/Nodes: Multiple subcentimeter mediastinal lymph nodes are likely reactive. No enlarged hilar, or axillary lymph nodes. Mildly patulous esophagus. Thyroid gland and trachea demonstrate no significant findings. Lungs/Pleura: Patchy increased peribronchovascular nodularity and interlobular septal thickening in the  right greater than left lower lobes. No focal consolidation, pleural effusion, or pneumothorax. Upper Abdomen: No acute abnormality. Musculoskeletal: No chest wall abnormality. No acute or significant osseous findings. Degenerative changes of the thoracic spine. Review of the MIP images confirms the above findings. IMPRESSION: 1. No evidence of pulmonary embolism. 2. Patchy increased peribronchovascular nodularity in the right greater than left lower lobes, likely related to atypical infection or aspiration, especially in the setting of a somewhat patulous esophagus. 3.  Aortic atherosclerosis (ICD10-I70.0). Electronically Signed   By: Titus Dubin M.D.   On: 08/13/2017 16:36    EKG: Orders placed or performed during the hospital encounter of 08/14/17  . ED EKG within 10 minutes  . ED EKG within 10 minutes  . EKG 12-Lead  . EKG 12-Lead  . EKG 12-Lead  . EKG 12-Lead    IMPRESSION AND PLAN: 1.  Bilateral pneumonia we will start antibiotics per protocol. 2.  Non-ST elevation MI we will start heparin drip, cardiology consulted. 3.  Acute renal failure likely prerenal secondary to dehydration, will start IV fluids and monitor kidney function daily. 4.  Diabetes type 2 stable continue low-carb diet. 5.  Hypertension well controlled, continue home medication. 6.  MDS stable hemoglobin level 9.6, continue to monitor.  All the records are reviewed and case discussed with ED provider. Management plans discussed with the patient, family and they are in agreement.  CODE STATUS: Code Status History    This patient does not have a recorded code status. Please follow your organizational policy for patients in this situation.    Advance Directive Documentation     Most Recent Value  Type of Advance Directive  Healthcare Power of Attorney, Living will  Pre-existing out of facility DNR order (yellow form or pink MOST form)  No data  "MOST" Form in Place?  No data  TOTAL TIME TAKING CARE OF  THIS PATIENT: 40 minutes.    Amelia Jo M.D on 08/14/2017 at 8:35 PM  Between 7am to 6pm - Pager - [phone]  After 6pm go to [email or web address] - password [email or web address] [facility]  [facility]  Office  [phone]  CC: Primary care physician; [facility] [place]

## 2017-08-14 NOTE — ED Triage Notes (Signed)
Pt arrives to ED via [facility] from home. Pt was seen and DC yesterday with pnumonia and a zpack (started them today). Pt c/o feeling worse. States cough, weakness, SOB.EMS reports temp of 100.4, CBG 170, 95-96% RA. Arrives with 18 L AC. Pt has weak cough upon assessment.

## 2017-08-14 NOTE — ED Notes (Signed)
Assisted pt to toilet. Used urinal in case of need for urine sample. Unsteady on feet.

## 2017-08-14 NOTE — ED Notes (Signed)
Pt. And family Verbalizes understanding of admission. VS stable and pain controlled per pt.  Pt. In NAD and Stable at the time of departure from the unit, departing unit by the safest and most appropriate manner per that pt condition and limitations with all belongings accounted for.

## 2017-08-14 NOTE — ED Provider Notes (Signed)
[facility] Emergency Department Provider Note ____________________________________________   I have reviewed the triage vital signs and the triage nursing note.  HISTORY  Chief Complaint Pneumonia   Historian Patient and wife and daughter and grandson  HPI Terry Gonzales is a 84 y.o. male with myelodysplastic syndrome, baseline hemoglobin around 9, with hypertension and diabetes presented to the emergency department yesterday and was treated and sent home with azithromycin for likely community acquired pneumonia.  Patient gets around by cane at home.  He has had less activity today and increased cough.  He had a fever yesterday to 101, he was on Tylenol today without any additional documented fevers.  He has continued to cough, nonproductive.  He does take aspirin baby daily, he is not sure if he took his medications this morning.  Family is concerned that he has been having worsening fatigue, cough, and difficulty getting up walking around despite diagnosis and antibiotic treatment since yesterday.   Past Medical History:  Diagnosis Date  . Diabetes mellitus without complication ([place])   . Hypertension   . Myelodysplastic syndrome ([place])     There are no active problems to display for this patient.   History reviewed. No pertinent surgical history.  Prior to Admission medications   Medication Sig Start Date End Date Taking? Authorizing Provider  azithromycin (ZITHROMAX Z-PAK) 250 MG tablet Take 2 tablets (500 mg) on  Day 1,  followed by 1 tablet (250 mg) once daily on Days 2 through 5. 08/13/17  Yes Rifenbark, Milta Deiters, MD  lisinopril (PRINIVIL,ZESTRIL) 20 MG tablet Take 20 mg by mouth daily. 05/06/17 05/06/18 Yes [provider]  Multiple Vitamin (MULTI-VITAMINS) TABS Take 1 tablet by mouth daily. 02/24/03  Yes [provider]  naproxen sodium (ALEVE) 220 MG tablet Take 220 mg by mouth daily. 02/24/03  Yes [provider]  Omega-3  1000 MG CAPS Take 1,000 mg by mouth daily. 09/18/11  Yes [provider]  simvastatin (ZOCOR) 10 MG tablet Take 10 mg by mouth daily. 05/06/17  Yes [provider]    Allergies  Allergen Reactions  . Penicillins Swelling    Has patient had a PCN reaction causing immediate rash, facial/tongue/throat swelling, SOB or lightheadedness with hypotension: Yes Has patient had a PCN reaction causing severe rash involving mucus membranes or skin necrosis: No Has patient had a PCN reaction that required hospitalization: No Has patient had a PCN reaction occurring within the last 10 years: No If all of the above answers are "NO", then may proceed with Cephalosporin use.    History reviewed. No pertinent family history.  Social History Social History   Tobacco Use  . Smoking status: Former Smoker  Substance Use Topics  . Alcohol use: No    Frequency: Never  . Drug use: Not on file    Review of Systems  Constitutional: Positive for fever. Eyes: Negative for visual changes. ENT: Negative for sore throat. Cardiovascular: He has been having chest pain all day today, but it seems to be much worse when he is coughing. Respiratory: Positive for shortness of breath. Gastrointestinal: Negative for abdominal pain, vomiting and diarrhea. Genitourinary: Negative for dysuria. Musculoskeletal: Negative for back pain. Skin: Negative for rash. Neurological: Negative for headache.  ____________________________________________   PHYSICAL EXAM:  VITAL SIGNS: ED Triage Vitals  Enc Vitals Group     BP 08/14/17 1831 (!) 131/52     Pulse Rate 08/14/17 1831 83     Resp 08/14/17 1831 (!) 27  Temp 08/14/17 1831 100 F (37.8 C)     Temp Source 08/14/17 1922 Oral     SpO2 08/14/17 1831 94 %     Weight 08/14/17 1831 160 lb (72.6 kg)     Height 08/14/17 1831 6' (1.829 m)     Head Circumference --      Peak Flow --      Pain Score 08/14/17 1831 5     Pain Loc --      Pain Edu? --       Excl. in [place]? --      Constitutional: Alert and oriented. Well appearing and in no distress. HEENT   Head: Normocephalic and atraumatic.      Eyes: Conjunctivae are normal. Pupils equal and round.       Ears:         Nose: No congestion/rhinnorhea.   Mouth/Throat: Mucous membranes are moist.   Neck: No stridor. Cardiovascular/Chest: Normal rate, regular rhythm.  No murmurs, rubs, or gallops. Respiratory: Normal respiratory effort without tachypnea nor retractions.  Mild decreased breath sounds throughout with rhonchi especially posterior bases both sides.  Intermittent dry and hacking cough. Gastrointestinal: Soft. No distention, no guarding, no rebound. Nontender.    Genitourinary/rectal:Deferred Musculoskeletal: Nontender with normal range of motion in all extremities. No joint effusions.  No lower extremity tenderness.  No edema. Neurologic:  Normal speech and language. No gross or focal neurologic deficits are appreciated. Skin:  Skin is warm, dry and intact. No rash noted. Psychiatric: Mood and affect are normal. Speech and behavior are normal. Patient exhibits appropriate insight and judgment.   ____________________________________________  LABS (pertinent positives/negatives) I, Lisa Roca, MD the attending physician have reviewed the labs noted below.  Labs Reviewed  BASIC METABOLIC PANEL - Abnormal; Notable for the following components:      Result Value   Sodium 133 (*)    Glucose, Bld 136 (*)    BUN 30 (*)    Creatinine, Ser 1.28 (*)    GFR calc non Af Amer 48 (*)    GFR calc Af Amer 55 (*)    All other components within normal limits  CBC - Abnormal; Notable for the following components:   RBC 2.65 (*)    Hemoglobin 9.6 (*)    HCT 29.1 (*)    MCV 109.7 (*)    MCH 36.3 (*)    RDW 17.5 (*)    Platelets 87 (*)    All other components within normal limits  TROPONIN I - Abnormal; Notable for the following components:   Troponin I 3.22 (*)    All  other components within normal limits  CULTURE, BLOOD (ROUTINE X 2)  CULTURE, BLOOD (ROUTINE X 2)  LACTIC ACID, PLASMA  LACTIC ACID, PLASMA  APTT  PROTIME-INR  HEPARIN LEVEL (UNFRACTIONATED)    ____________________________________________    EKG I, Lisa Roca, MD, the attending physician have personally viewed and interpreted all ECGs.  83 bpm,  normal sinus rhythm.  Nonspecific intraventricular conduction delay.  Left axis deviation.  Nonspecific T wave with some ST segment depression laterally ____________________________________________  RADIOLOGY All Xrays were viewed by me.  Imaging interpreted by Radiologist, and I, Lisa Roca, MD the attending physician have reviewed the radiologist interpretation noted below.  Chest x-ray two-view:  IMPRESSION: Patchy bibasilar opacity, at least in part due to atelectasis but based on CT appearance, likely with associated pneumonia and/ or aspiration. Lungs elsewhere clear. Stable cardiac silhouette. There is aortic atherosclerosis. Foci of coronary  artery calcification also noted. __________________________________________  PROCEDURES  Procedure(s) performed: None  Critical Care performed: CRITICAL CARE Performed by: Lisa Roca   Total critical care time: 30 minutes  Critical care time was exclusive of separately billable procedures and treating other patients.  Critical care was necessary to treat or prevent imminent or life-threatening deterioration.  Critical care was time spent personally by me on the following activities: development of treatment plan with patient and/or surrogate as well as nursing, discussions with consultants, evaluation of patient's response to treatment, examination of patient, obtaining history from patient or surrogate, ordering and performing treatments and interventions, ordering and review of laboratory studies, ordering and review of radiographic studies, pulse oximetry and re-evaluation of  patient's condition.    ____________________________________________  ED COURSE / ASSESSMENT AND PLAN  Pertinent labs & imaging results that were available during my care of the patient were reviewed by me and considered in my medical decision making (see chart for details).   Family reports worsening generalized weakness and cough despite diagnosed with [place] acquired pneumonia started on azithromycin yesterday.  Patient has stable vital signs with no fever, no tachycardia, no hypotension, and O2 sat around 97% here.  They report that he is extremely fatigued and hardly able to get up right now.  He does have a cough here and he complains of pain when he coughs.  Troponin did come back significantly elevated consistent with an STEMI, EKG is nonspecific with some T wave inversions and ST segment depressions laterally.  Patient states that he actually has had some chest pain all day.  Patient given aspirin as well as heparin and drip for N STEMI.  Hemoglobin is consistent with prior anemia around 9.  DIFFERENTIAL DIAGNOSIS: Differential diagnosis includes, but is not limited to, ACS, aortic dissection, pulmonary embolism, cardiac tamponade, pneumothorax, pneumonia, pericarditis, myocarditis, GI-related causes including esophagitis/gastritis, and musculoskeletal chest wall pain.    CONSULTATIONS:   Hospitalist for admission   Patient / Family / Caregiver informed of clinical course, medical decision-making process, and agree with plan.     ___________________________________________   FINAL CLINICAL IMPRESSION(S) / ED DIAGNOSES   Final diagnoses:  Acute renal failure, unspecified acute renal failure type ([place])  NSTEMI (non-ST elevated myocardial infarction) ([place])  Pneumonia of both lower lobes due to infectious organism [facility])      ___________________________________________        Note: This dictation was prepared with Dragon dictation. Any transcriptional  errors that result from this process are unintentional    Lisa Roca, MD 08/14/17 2009

## 2017-08-14 NOTE — ED Notes (Signed)
Test: trop Critical Value: 3.22  Name of Provider Notified: DR Reita Cliche

## 2017-08-14 NOTE — Progress Notes (Signed)
ANTICOAGULATION CONSULT NOTE - Initial Consult  Pharmacy Consult for heparin gtt Indication: chest pain/ACS  Allergies  Allergen Reactions  . Penicillins Swelling    Patient Measurements: Height: 6' (182.9 cm) Weight: 160 lb (72.6 kg) IBW/kg (Calculated) : 77.6 Heparin Dosing Weight: 72.6kg  Vital Signs: Temp: 98.4 F (36.9 C) (01/02 1922) Temp Source: Oral (01/02 1922) BP: 125/49 (01/02 1900) Pulse Rate: 82 (01/02 1900)  Labs: Recent Labs    08/13/17 1358 08/14/17 1833  HGB 10.0* 9.6*  HCT 30.1* 29.1*  PLT 119* 87*  CREATININE 1.00 1.28*  TROPONINI <0.03 3.22*    Estimated Creatinine Clearance: 39.4 mL/min (A) (by C-G formula based on SCr of 1.28 mg/dL (H)).   Medical History: Past Medical History:  Diagnosis Date  . Diabetes mellitus without complication ([place])   . Hypertension   . Myelodysplastic syndrome ([place])     Medications:   (Not in a hospital admission) Scheduled:  . nitroGLYCERIN      . aspirin  324 mg Oral Once  . heparin  4,000 Units Intravenous Once  . nitroGLYCERIN  1 inch Topical Once   Infusions:  . heparin    . levofloxacin (LEVAQUIN) IV    . sodium chloride     PRN:  Anti-infectives (From admission, onward)   Start     Dose/Rate Route Frequency Ordered Stop   08/14/17 1945  levofloxacin (LEVAQUIN) IVPB 750 mg     750 mg 100 mL/hr over 90 Minutes Intravenous  Once 08/14/17 1944        Assessment: 84  Yo male with acs requiring heparin gtt per pharmacy. Spoke with Dr Reita Cliche regarding thrombocytopenia, with troponin of 3.22 and may be baseline platelets would like to proceed.    Goal of Therapy:  Heparin level 0.3-0.7 units/ml Monitor platelets by anticoagulation protocol: Yes   Plan:  Give 4000 units bolus x 1 Start heparin infusion at 850 units/hr Check anti-Xa level in 8 hours and daily while on heparin Continue to monitor H&H and platelets  Donna Christen Waldemar Siegel 08/14/2017,7:46 PM

## 2017-08-14 NOTE — Progress Notes (Signed)
PHARMACY NOTE:  ANTIMICROBIAL RENAL DOSAGE ADJUSTMENT  Current antimicrobial regimen includes a mismatch between antimicrobial dosage and estimated renal function.  As per policy approved by the Pharmacy & Therapeutics and Medical Executive Committees, the antimicrobial dosage will be adjusted accordingly.  Current antimicrobial dosage:  Levaquin 500 mg IV Q24H   Indication: PNA   Renal Function:  Estimated Creatinine Clearance: 39.4 mL/min (A) (by C-G formula based on SCr of 1.28 mg/dL (H)). []      On intermittent HD, scheduled: []      On CRRT    Antimicrobial dosage has been changed to:  Levaquin 750 mg IV X 1 given on 1/2 ,   Levaquin 250 mg IV Q24H to start on 1/3 @ 1000.   Additional comments:   Thank you for allowing pharmacy to be a part of this patient's care.  Jasiel Belisle D, [facility] 08/14/2017 10:19 PM

## 2017-08-14 NOTE — ED Notes (Signed)
ED Provider at bedside. 

## 2017-08-15 ENCOUNTER — Inpatient Hospital Stay (HOSPITAL_COMMUNITY)
Admit: 2017-08-15 | Discharge: 2017-08-15 | Disposition: A | Discharge status: Home / Self Care | Payer: Medicare Other | Attending: Physician Assistant | Admitting: Physician Assistant

## 2017-08-15 DIAGNOSIS — I214 Non-ST elevation (NSTEMI) myocardial infarction: Secondary | ICD-10-CM

## 2017-08-15 DIAGNOSIS — N179 Acute kidney failure, unspecified: Secondary | ICD-10-CM

## 2017-08-15 DIAGNOSIS — I255 Ischemic cardiomyopathy: Secondary | ICD-10-CM

## 2017-08-15 DIAGNOSIS — J189 Pneumonia, unspecified organism: Secondary | ICD-10-CM

## 2017-08-15 DIAGNOSIS — I350 Nonrheumatic aortic (valve) stenosis: Secondary | ICD-10-CM

## 2017-08-15 DIAGNOSIS — I2109 ST elevation (STEMI) myocardial infarction involving other coronary artery of anterior wall: Secondary | ICD-10-CM

## 2017-08-15 DIAGNOSIS — I361 Nonrheumatic tricuspid (valve) insufficiency: Secondary | ICD-10-CM

## 2017-08-15 DIAGNOSIS — R0603 Acute respiratory distress: Secondary | ICD-10-CM

## 2017-08-15 LAB — PROTIME-INR
INR: 1.77
Prothrombin Time: 20.5 seconds — ABNORMAL HIGH (ref 11.4–15.2)

## 2017-08-15 LAB — GLUCOSE, CAPILLARY
GLUCOSE-CAPILLARY: 130 mg/dL — AB (ref 65–99)
GLUCOSE-CAPILLARY: 165 mg/dL — AB (ref 65–99)

## 2017-08-15 LAB — BASIC METABOLIC PANEL
ANION GAP: 9 (ref 5–15)
BUN: 31 mg/dL — ABNORMAL HIGH (ref 6–20)
CHLORIDE: 103 mmol/L (ref 101–111)
CO2: 21 mmol/L — ABNORMAL LOW (ref 22–32)
Calcium: 8.5 mg/dL — ABNORMAL LOW (ref 8.9–10.3)
Creatinine, Ser: 1.33 mg/dL — ABNORMAL HIGH (ref 0.61–1.24)
GFR calc non Af Amer: 45 mL/min — ABNORMAL LOW (ref >60)
GFR, EST AFRICAN AMERICAN: 53 mL/min — AB (ref >60)
Glucose, Bld: 130 mg/dL — ABNORMAL HIGH (ref 65–99)
POTASSIUM: 3.9 mmol/L (ref 3.5–5.1)
Sodium: 133 mmol/L — ABNORMAL LOW (ref 135–145)

## 2017-08-15 LAB — CBC
HEMATOCRIT: 26.3 % — AB (ref 40.0–52.0)
HEMOGLOBIN: 8.6 g/dL — AB (ref 13.0–18.0)
MCH: 35.8 pg — AB (ref 26.0–34.0)
MCHC: 32.5 g/dL (ref 32.0–36.0)
MCV: 110.1 fL — AB (ref 80.0–100.0)
PLATELETS: 86 10*3/uL — AB (ref 150–440)
RBC: 2.39 MIL/uL — AB (ref 4.40–5.90)
RDW: 17.4 % — ABNORMAL HIGH (ref 11.5–14.5)
WBC: 9 10*3/uL (ref 3.8–10.6)

## 2017-08-15 LAB — TROPONIN I
TROPONIN I: 24.15 ng/mL — AB (ref <0.03)
Troponin I: 2.47 ng/mL (ref <0.03)
Troponin I: 25.82 ng/mL (ref <0.03)
Troponin I: 9.24 ng/mL (ref <0.03)

## 2017-08-15 LAB — LACTIC ACID, PLASMA
LACTIC ACID, VENOUS: 2 mmol/L — AB (ref 0.5–1.9)
Lactic Acid, Venous: 2.7 mmol/L (ref 0.5–1.9)

## 2017-08-15 LAB — HEPARIN LEVEL (UNFRACTIONATED)
HEPARIN UNFRACTIONATED: 0.27 [IU]/mL — AB (ref 0.30–0.70)
Heparin Unfractionated: 0.14 IU/mL — ABNORMAL LOW (ref 0.30–0.70)

## 2017-08-15 LAB — ECHOCARDIOGRAM COMPLETE
Height: 72 in
WEIGHTICAEL: 2470.4 [oz_av]

## 2017-08-15 LAB — CREATININE, SERUM
Creatinine, Ser: 1.38 mg/dL — ABNORMAL HIGH (ref 0.61–1.24)
GFR calc Af Amer: 50 mL/min — ABNORMAL LOW (ref >60)
GFR, EST NON AFRICAN AMERICAN: 43 mL/min — AB (ref >60)

## 2017-08-15 LAB — PREPARE RBC (CROSSMATCH)

## 2017-08-15 LAB — PROCALCITONIN: Procalcitonin: 6.45 ng/mL

## 2017-08-15 LAB — APTT: APTT: 132 s — AB (ref 24–36)

## 2017-08-15 LAB — ABO/RH: ABO/RH(D): A NEG

## 2017-08-15 LAB — MRSA PCR SCREENING: MRSA by PCR: POSITIVE — AB

## 2017-08-15 MED ORDER — SODIUM CHLORIDE 0.9 % IV SOLN
Freq: Once | INTRAVENOUS | Status: AC
  Administered 2017-08-15: 08:00:00 via INTRAVENOUS

## 2017-08-15 MED ORDER — BUDESONIDE 0.5 MG/2ML IN SUSP
0.5000 mg | Freq: Two times a day (BID) | RESPIRATORY_TRACT | Status: DC
  Administered 2017-08-15 – 2017-08-19 (×7): 0.5 mg via RESPIRATORY_TRACT
  Filled 2017-08-15 (×8): qty 2

## 2017-08-15 MED ORDER — HEPARIN BOLUS VIA INFUSION
1400.0000 [IU] | Freq: Once | INTRAVENOUS | Status: AC
  Administered 2017-08-15: 1400 [IU] via INTRAVENOUS
  Filled 2017-08-15: qty 1400

## 2017-08-15 MED ORDER — HEPARIN BOLUS VIA INFUSION
1000.0000 [IU] | Freq: Once | INTRAVENOUS | Status: AC
  Administered 2017-08-15: 1000 [IU] via INTRAVENOUS
  Filled 2017-08-15: qty 1000

## 2017-08-15 MED ORDER — IPRATROPIUM-ALBUTEROL 0.5-2.5 (3) MG/3ML IN SOLN
3.0000 mL | RESPIRATORY_TRACT | Status: DC
  Administered 2017-08-15 – 2017-08-18 (×16): 3 mL via RESPIRATORY_TRACT
  Filled 2017-08-15 (×17): qty 3

## 2017-08-15 MED ORDER — ORAL CARE MOUTH RINSE
15.0000 mL | Freq: Two times a day (BID) | OROMUCOSAL | Status: DC
  Administered 2017-08-15 – 2017-08-19 (×6): 15 mL via OROMUCOSAL

## 2017-08-15 MED ORDER — PREDNISONE 20 MG PO TABS
20.0000 mg | ORAL_TABLET | Freq: Every day | ORAL | Status: DC
  Administered 2017-08-17 – 2017-08-19 (×3): 20 mg via ORAL
  Filled 2017-08-15 (×3): qty 1

## 2017-08-15 MED ORDER — SODIUM CHLORIDE 0.9 % IV SOLN
Freq: Once | INTRAVENOUS | Status: AC
  Administered 2017-08-15: 13:00:00 via INTRAVENOUS

## 2017-08-15 MED ORDER — SODIUM CHLORIDE 0.9 % IV SOLN: Freq: Once | INTRAVENOUS | Status: DC

## 2017-08-15 NOTE — Care Management Note (Signed)
Case Management Note  Patient Details  Name: Terry Gonzales MRN: [id number] Date of Birth: [birth year]  Subjective/Objective:                 Patient presented to ED 1/1 and discharged with treatment for pneumonia.  Presented back 1/2 and found to have elevated troponin- nstemi- bilateral pneumonia and acute renal failure. Currently concern for septic/cardiogenic shock. At present is on room air with sat of 91%.   Action/Plan:   Expected Discharge Date:                  Expected Discharge Plan:     In-House Referral:     Discharge planning Services     Post Acute Care Choice:    Choice offered to:     DME Arranged:    DME Agency:     HH Arranged:    HH Agency:     Status of Service:     If discussed at [employer] of Stay Meetings, dates discussed:    Additional Comments:  Katrina Stack, RN 08/15/2017, 10:02 AM

## 2017-08-15 NOTE — Progress Notes (Signed)
Patient admitted for sepsis from pneumonia, NSTEMI Transferred to [place] for low BP  Assessment- BP improved, no resp distress  Plan- Transfuse 2 units PRBC's Follow up cardiology recs Continue IV abx Consider Starting Steroids and ABX BD therapy  Will follow along.    Corrin Parker, M.D.  [facility]  Medical Director [place] Director [facility] Cardio-Pulmonary Department

## 2017-08-15 NOTE — Consult Note (Signed)
Cardiology Consultation:   Patient ID: Terry Gonzales; [id number]; 02/26/1925   Admit date: 08/14/2017 Date of Consult: 08/15/2017  Primary Care Provider: [facility] [place] [facility] Primary Cardiologist: New to [facility] - consult by Rockey Situ   Patient Profile:   Terry Gonzales is a 84 y.o. male with a hx of MDS, DM, HTN, and HLD who is being seen today for the evaluation of elevated troponin at the request of Dr. Duane Boston.  History of Present Illness:   Terry Gonzales has no previously known cardiac history. He was seen in the ED on 08/13/17 with a several day history of cough, congestion, fever, chills, and myalgias. He had been taking amoxicillin at home without improvement in symptoms. In the ED troponin was checked x 1 and noted to be < 0.03, not trended. EKG was poor quality and showed anterior st elevation, not trended. CXR showed coarse interstitial markings. CTA chest was negative for PE and showed likely atypical infection. Influenza swab was negative, WBC 1.9, HGB 10.0, PLT 119, SCr 1.00, K+ 4.8. He was given a Z-pack and discharged home. He continued to mount a fever at home with a T-max of 101. This was followed by the development of worsening chest pain with associated diaphoresis, nausea, vomiting, and SOB prompting his return on 1/2. Upon his arrival to [facility] he was noted to have a troponin of 3.22-->2.47-->9.24, EKG showed nonspecific anterior, inferior, and lateral st/t changes as below. CXR with concern for PNA. Lactic acid 1.5-->2.7, blood culture without growth x 2 to date, SCr 1.28-->1.33, K+ 4.4-->3.9, WBC 5.6-->9.0, HGB 9.6-->8.6, PLT 87-->86. He was given full dose ASA, nitro paste was applied, and he was started on IV Levaquin. He was started on heparin gtt. Cardiology was asked to evaluate. This morning, he continues to note a 5/10 substernal chest pain with associated SOB and lethargy.   Past Medical History:  Diagnosis Date  . Diabetes mellitus without complication ([place])   .  Hypertension   . Myelodysplastic syndrome ([place])     History reviewed. No pertinent surgical history.   Home Meds: Prior to Admission medications   Medication Sig Start Date End Date Taking? Authorizing Provider  azithromycin (ZITHROMAX Z-PAK) 250 MG tablet Take 2 tablets (500 mg) on  Day 1,  followed by 1 tablet (250 mg) once daily on Days 2 through 5. 08/13/17  Yes Rifenbark, Milta Deiters, MD  lisinopril (PRINIVIL,ZESTRIL) 20 MG tablet Take 20 mg by mouth daily. 05/06/17 05/06/18 Yes [provider]  Multiple Vitamin (MULTI-VITAMINS) TABS Take 1 tablet by mouth daily. 02/24/03  Yes [provider]  naproxen sodium (ALEVE) 220 MG tablet Take 220 mg by mouth daily. 02/24/03  Yes [provider]  Omega-3 1000 MG CAPS Take 1,000 mg by mouth daily. 09/18/11  Yes [provider]  simvastatin (ZOCOR) 10 MG tablet Take 10 mg by mouth daily. 05/06/17  Yes [provider]    Inpatient Medications: Scheduled Meds: . aspirin EC  81 mg Oral Daily  . docusate sodium  100 mg Oral BID  . multivitamin with minerals  1 tablet Oral Daily  . naproxen  250 mg Oral Daily  . omega-3 acid ethyl esters  1,000 mg Oral Daily  . simvastatin  10 mg Oral Daily   Continuous Infusions: . heparin 1,000 Units/hr (08/15/17 2956)  . levofloxacin (LEVAQUIN) IV     PRN Meds: acetaminophen **OR** acetaminophen, bisacodyl, guaiFENesin, HYDROcodone-acetaminophen, ondansetron **OR** ondansetron (ZOFRAN) IV, traZODone  Allergies:   Allergies  Allergen Reactions  .  Penicillins Swelling    Has patient had a PCN reaction causing immediate rash, facial/tongue/throat swelling, SOB or lightheadedness with hypotension: Yes Has patient had a PCN reaction causing severe rash involving mucus membranes or skin necrosis: No Has patient had a PCN reaction that required hospitalization: No Has patient had a PCN reaction occurring within the last 10 years: No If all of the above answers are "NO", then  may proceed with Cephalosporin use.    Social History:   Social History   Socioeconomic History  . Marital status: Married    Spouse name: Not on file  . Number of children: Not on file  . Years of education: Not on file  . Highest education level: Not on file  Social Needs  . Financial resource strain: Not on file  . Food insecurity - worry: Not on file  . Food insecurity - inability: Not on file  . Transportation needs - medical: Not on file  . Transportation needs - non-medical: Not on file  Occupational History  . Not on file  Tobacco Use  . Smoking status: Former Smoker    Packs/day: 1.00    Types: Cigarettes    Last attempt to quit: 08/13/1968    Years since quitting: 49.0  . Smokeless tobacco: Never Used  Substance and Sexual Activity  . Alcohol use: No    Frequency: Never  . Drug use: Not on file  . Sexual activity: Not on file  Other Topics Concern  . Not on file  Social History Narrative  . Not on file     Family History:  Family History  Problem Relation Age of Onset  . Hypertension Father     ROS:  Review of Systems  Constitutional: Positive for chills, diaphoresis, fever and malaise/fatigue. Negative for weight loss.  HENT: Positive for congestion.   Eyes: Negative for discharge and redness.  Respiratory: Positive for cough, sputum production and shortness of breath. Negative for hemoptysis and wheezing.        Clear sputum  Cardiovascular: Positive for chest pain. Negative for palpitations, orthopnea, claudication, leg swelling and PND.  Gastrointestinal: Positive for nausea. Negative for abdominal pain, blood in stool, heartburn, melena and vomiting.  Genitourinary: Negative for hematuria.  Musculoskeletal: Positive for myalgias. Negative for falls.  Skin: Negative for rash.  Neurological: Positive for weakness. Negative for dizziness, tingling, tremors, sensory change, speech change, focal weakness and loss of consciousness.  Endo/Heme/Allergies:  Does not bruise/bleed easily.  Psychiatric/Behavioral: Negative for substance abuse. The patient is not nervous/anxious.   All other systems reviewed and are negative.     Physical Exam/Data:   Vitals:   08/14/17 2154 08/14/17 2200 08/15/17 0412 08/15/17 0650  BP: (!) 122/52  (!) 99/54   Pulse: 92  (!) 108   Resp: 18  18   Temp: 99.3 F (37.4 C)   99.4 F (37.4 C)  TempSrc: Oral   Oral  SpO2: 95%  90%   Weight:  156 lb 11.2 oz (71.1 kg) 154 lb 6.4 oz (70 kg)   Height:  6' (1.829 m)      Intake/Output Summary (Last 24 hours) at 08/15/2017 0803 Last data filed at 08/15/2017 0600 Gross per 24 hour  Intake 1084.15 ml  Output 250 ml  Net 834.15 ml   Filed Weights   08/14/17 1831 08/14/17 2200 08/15/17 0412  Weight: 160 lb (72.6 kg) 156 lb 11.2 oz (71.1 kg) 154 lb 6.4 oz (70 kg)   Body mass index is  20.94 kg/m.   Physical Exam: General: Frail appearing, in no acute distress. Head: Normocephalic, atraumatic, sclera non-icteric, no xanthomas, nares without discharge.  Neck: Negative for carotid bruits. JVD not elevated. Lungs: Diminished breath sounds with diffuse rhonchi and crackles. Breathing is unlabored. Heart: RRR with S1 S2. No murmurs, rubs, or gallops appreciated. Abdomen: Soft, non-tender, non-distended with normoactive bowel sounds. No hepatomegaly. No rebound/guarding. No obvious abdominal masses. Msk:  Strength and tone appear normal for age. Extremities: No clubbing or cyanosis. No edema. Distal pedal pulses are 2+ and equal bilaterally. Neuro: Alert and oriented X 3. No facial asymmetry. No focal deficit. Moves all extremities spontaneously. Psych:  Responds to questions appropriately with a normal affect.   EKG:  The EKG was personally reviewed and demonstrates: NSR, 83 bpm, left anterior fascicular block, nonspecific anterior st elevation not meeting stemi criteria with nonspecific inferior st depression and TWI, lateral TWI Telemetry:  Telemetry was personally  reviewed and demonstrates: sinus rhythm to sinus tachycardia   Weights: Filed Weights   08/14/17 1831 08/14/17 2200 08/15/17 0412  Weight: 160 lb (72.6 kg) 156 lb 11.2 oz (71.1 kg) 154 lb 6.4 oz (70 kg)    Relevant CV Studies: none  Laboratory Data:  Chemistry Recent Labs  Lab 08/13/17 1358 08/14/17 1833 08/15/17 0530  NA 136 133* 133*  K 4.8 4.4 3.9  CL 103 102 103  CO2 26 22 21*  GLUCOSE 120* 136* 130*  BUN 20 30* 31*  CREATININE 1.00 1.28* 1.33*  CALCIUM 9.3 8.9 8.5*  GFRNONAA >60 48* 45*  GFRAA >60 55* 53*  ANIONGAP 7 9 9     Recent Labs  Lab 08/13/17 1358  PROT 7.3  ALBUMIN 3.8  AST 29  ALT 12*  ALKPHOS 98  BILITOT 1.0   Hematology Recent Labs  Lab 08/13/17 1358 08/14/17 1833 08/15/17 0530  WBC 1.9* 5.6 9.0  RBC 2.76* 2.65* 2.39*  HGB 10.0* 9.6* 8.6*  HCT 30.1* 29.1* 26.3*  MCV 109.1* 109.7* 110.1*  MCH 36.1* 36.3* 35.8*  MCHC 33.1 33.1 32.5  RDW 18.0* 17.5* 17.4*  PLT 119* 87* 86*   Cardiac Enzymes Recent Labs  Lab 08/13/17 1358 08/14/17 1833 08/14/17 2347 08/15/17 0530  TROPONINI <0.03 3.22* 2.47* 9.24*   No results for input(s): TROPIPOC in the last 168 hours.  BNPNo results for input(s): BNP, PROBNP in the last 168 hours.  DDimer No results for input(s): DDIMER in the last 168 hours.  Radiology/Studies:  Dg Chest 2 View  Result Date: 08/14/2017 IMPRESSION: Patchy bibasilar opacity, at least in part due to atelectasis but based on CT appearance, likely with associated pneumonia and/ or aspiration. Lungs elsewhere clear. Stable cardiac silhouette. There is aortic atherosclerosis. Foci of coronary artery calcification also noted. Aortic Atherosclerosis (ICD10-I70.0). Electronically Signed   By: Lowella Grip III M.D.   On: 08/14/2017 18:58   Dg Chest 2 View  Result Date: 08/13/2017 IMPRESSION: Coarse interstitial markings may reflect either early edema, chronic scarring, or potentially atypical infection. No evidence of lobar  pneumonia. Electronically Signed   By: Corrie Mckusick D.O.   On: 08/13/2017 14:42   Ct Angio Chest Pe W/cm &/or Wo Cm  Result Date: 08/13/2017 IMPRESSION: 1. No evidence of pulmonary embolism. 2. Patchy increased peribronchovascular nodularity in the right greater than left lower lobes, likely related to atypical infection or aspiration, especially in the setting of a somewhat patulous esophagus. 3.  Aortic atherosclerosis (ICD10-I70.0). Electronically Signed   By: Orville Govern.D.  On: 08/13/2017 16:36    Assessment and Plan:   1. NSTEMI: -Uncertain as to the exact timing of his event given his initial EKG changes noted in the ED on 1/1 that were not trended with a negative troponin x 1 that was not trended -Troponin now trending up to 9.24 -Check stat EKG and echo -Currently on heparin gtt, will need to monitor closely given his MDS with pancytopenia  -Unlikely to be a good invasive candidate given his comorbid conditions, acute illness, advanced age, and frail state -Family would like to see echo prior to making decisions  -ASA -Not on beta blocker given hypotension -If indicated, check A1c and lipid panel for further risk stratification  2. Acute respiratory distress: -Likely multifactorial including his CAP, NSTEMI, and septic/cardiogenic shock -Supplemental oxygen per IM -Supportive care per IM  4. CAP with septic vs cardiogenic shock: -ABX per IM -Discontinue nitro paste -Stop lisinopril  -May need IV fluid bolus -Consider transfer to ICU for pressor support, defer to IM  5. MDS/pancytopenia: -Per IM -His low HGB and PLT count likely preclude any invasive cardiac testing at this time  6. AKI: -  7. Dispo: -Overall, poor prognosis  -Consider Palliative Care consult for goals of care discussion   For questions or updates, please contact [place] Please consult [email or web address] for contact info under Cardiology/STEMI.   Signed, Christell Faith, PA-C [place] Pager: [phone] 08/15/2017, 8:03 AM

## 2017-08-15 NOTE — Progress Notes (Signed)
   Troponin has trended to 25.82. Chest pain noted only with coughing. Currently resting comfortably on supplemental oxygen at 3 L via nasal cannula. Check stat EKG. Continue to cycle troponin until peak. Plan for diagnostic LHC with Dr. Rockey Situ on 1/4. Risks and benefits of cardiac catheterization have been discussed with the patient including risks of bleeding, bruising, infection, kidney damage, stroke, heart attack, and death. The patient understands these risks and is willing to proceed with the procedure. All questions have been answered and concerns listened to.

## 2017-08-15 NOTE — Progress Notes (Signed)
[facility] [place] NAME: Terry Gonzales    MR#:  [id number]  DATE OF BIRTH:  Nov 13, 1924  SUBJECTIVE:  CHIEF COMPLAINT:   Chief Complaint  Patient presents with  . Pneumonia     Came with worsening respiratory status with cough and chest pain. Found to have pneumonia and sepsis. Running hypotensive. Also have troponin trending upwards. Complaining of chest pain which is worse with coughing.  REVIEW OF SYSTEMS:  CONSTITUTIONAL: No fever, fatigue or weakness.  EYES: No blurred or double vision.  EARS, NOSE, AND THROAT: No tinnitus or ear pain.  RESPIRATORY: Positive for cough, shortness of breath, no wheezing or hemoptysis.  CARDIOVASCULAR: Have chest pain, no orthopnea, edema.  GASTROINTESTINAL: No nausea, vomiting, diarrhea or abdominal pain.  GENITOURINARY: No dysuria, hematuria.  ENDOCRINE: No polyuria, nocturia,  HEMATOLOGY: No anemia, easy bruising or bleeding SKIN: No rash or lesion. MUSCULOSKELETAL: No joint pain or arthritis.   NEUROLOGIC: No tingling, numbness, weakness.  PSYCHIATRY: No anxiety or depression.   ROS  DRUG ALLERGIES:   Allergies  Allergen Reactions  . Penicillins Swelling    Has patient had a PCN reaction causing immediate rash, facial/tongue/throat swelling, SOB or lightheadedness with hypotension: Yes Has patient had a PCN reaction causing severe rash involving mucus membranes or skin necrosis: No Has patient had a PCN reaction that required hospitalization: No Has patient had a PCN reaction occurring within the last 10 years: No If all of the above answers are "NO", then may proceed with Cephalosporin use.    VITALS:  Blood pressure (!) 88/49, pulse 67, temperature 97.8 F (36.6 C), temperature source Oral, resp. rate 12, height 6' (1.829 m), weight 70 kg (154 lb 6.4 oz), SpO2 92 %.  PHYSICAL EXAMINATION:  GENERAL:  84 y.o.-year-old patient lying in the bed with no acute distress.  EYES: Pupils  equal, round, reactive to light and accommodation. No scleral icterus. Extraocular muscles intact.  HEENT: Head atraumatic, normocephalic. Oropharynx and nasopharynx clear.  NECK:  Supple, no jugular venous distention. No thyroid enlargement, no tenderness.  LUNGS: Normal breath sounds bilaterally, no wheezing, have crepitation. No use of accessory muscles of respiration.  CARDIOVASCULAR: S1, S2 normal. No murmurs, rubs, or gallops.  ABDOMEN: Soft, nontender, nondistended. Bowel sounds present. No organomegaly or mass.  EXTREMITIES: No pedal edema, cyanosis, or clubbing.  NEUROLOGIC: Cranial nerves II through XII are intact. Muscle strength 4/5 in all extremities. Sensation intact. Gait not checked.  PSYCHIATRIC: The patient is alert and oriented x 3.  SKIN: No obvious rash, lesion, or ulcer.   Physical Exam LABORATORY PANEL:   CBC Recent Labs  Lab 08/15/17 0530  WBC 9.0  HGB 8.6*  HCT 26.3*  PLT 86*   ------------------------------------------------------------------------------------------------------------------  Chemistries  Recent Labs  Lab 08/13/17 1358  08/15/17 0530  NA 136   < > 133*  K 4.8   < > 3.9  CL 103   < > 103  CO2 26   < > 21*  GLUCOSE 120*   < > 130*  BUN 20   < > 31*  CREATININE 1.00   < > 1.33*  CALCIUM 9.3   < > 8.5*  AST 29  --   --   ALT 12*  --   --   ALKPHOS 98  --   --   BILITOT 1.0  --   --    < > = values in this interval not displayed.   ------------------------------------------------------------------------------------------------------------------  Cardiac Enzymes Recent Labs  Lab 08/15/17 0530 08/15/17 1146  TROPONINI 9.24* 25.82*   ------------------------------------------------------------------------------------------------------------------  RADIOLOGY:  Dg Chest 2 View  Result Date: 08/14/2017 CLINICAL DATA:  Shortness of breath and cough EXAM: CHEST  2 VIEW COMPARISON:  Chest radiograph and chest CT August 13, 2017  FINDINGS: There is stable patchy opacity in the lung bases, somewhat more on the right than on the left. No new opacity evident elsewhere. Heart is upper normal in size with pulmonary vascularity within normal limits. There is aortic atherosclerosis. There is an old healed fracture of the right clavicle. There is calcification in the left anterior descending and left circumflex coronary artery. IMPRESSION: Patchy bibasilar opacity, at least in part due to atelectasis but based on CT appearance, likely with associated pneumonia and/ or aspiration. Lungs elsewhere clear. Stable cardiac silhouette. There is aortic atherosclerosis. Foci of coronary artery calcification also noted. Aortic Atherosclerosis (ICD10-I70.0). Electronically Signed   By: Lowella Grip III M.D.   On: 08/14/2017 18:58   Dg Chest 2 View  Result Date: 08/13/2017 CLINICAL DATA:  84 year old male with a history of shortness of breath EXAM: CHEST  2 VIEW COMPARISON:  10/30/2014, 05/27/2014 FINDINGS: Cardiomediastinal silhouette unchanged in size and contour. No evidence of central vascular congestion. Compare to the prior there is increased interstitial opacities, particularly at the right lung base. No pneumothorax. No confluent airspace disease. No pleural effusion. Coronary calcifications. IMPRESSION: Coarse interstitial markings may reflect either early edema, chronic scarring, or potentially atypical infection. No evidence of lobar pneumonia. Electronically Signed   By: Corrie Mckusick D.O.   On: 08/13/2017 14:42   Ct Angio Chest Pe W/cm &/or Wo Cm  Result Date: 08/13/2017 CLINICAL DATA:  Flu like symptoms for the past 3 days with nonproductive an week cough. Worsening shortness of breath overnight. Evaluate pulmonary embolism. EXAM: CT ANGIOGRAPHY CHEST WITH CONTRAST TECHNIQUE: Multidetector CT imaging of the chest was performed using the standard protocol during bolus administration of intravenous contrast. Multiplanar CT image  reconstructions and MIPs were obtained to evaluate the vascular anatomy. CONTRAST:  33mL ISOVUE-370 IOPAMIDOL (ISOVUE-370) INJECTION 76% COMPARISON:  Chest x-ray from same day. FINDINGS: Cardiovascular: Satisfactory opacification of the pulmonary arteries to the segmental level. No evidence of pulmonary embolism. Normal heart size. No pericardial effusion. Normal caliber thoracic aorta. Coronary, aortic arch, and branch vessel atherosclerotic vascular disease. Mediastinum/Nodes: Multiple subcentimeter mediastinal lymph nodes are likely reactive. No enlarged hilar, or axillary lymph nodes. Mildly patulous esophagus. Thyroid gland and trachea demonstrate no significant findings. Lungs/Pleura: Patchy increased peribronchovascular nodularity and interlobular septal thickening in the right greater than left lower lobes. No focal consolidation, pleural effusion, or pneumothorax. Upper Abdomen: No acute abnormality. Musculoskeletal: No chest wall abnormality. No acute or significant osseous findings. Degenerative changes of the thoracic spine. Review of the MIP images confirms the above findings. IMPRESSION: 1. No evidence of pulmonary embolism. 2. Patchy increased peribronchovascular nodularity in the right greater than left lower lobes, likely related to atypical infection or aspiration, especially in the setting of a somewhat patulous esophagus. 3.  Aortic atherosclerosis (ICD10-I70.0). Electronically Signed   By: Titus Dubin M.D.   On: 08/13/2017 16:36    ASSESSMENT AND PLAN:   Active Problems:   Pneumonia   1.  sepsis,Bilateral pneumonia     Abx, IV fluids, still stays hypotensive, so transferred to stepdown. 2.  Non-ST elevation MI   heparin drip, cardiology consulted. Monitor for now and once stable , may do cath or if patient  continued to have worsening chest pain and then may proceed for cath at that time. 3.  Acute renal failure likely prerenal secondary to dehydration, will start IV fluids and  monitor kidney function daily. 4.  Diabetes type 2 stable continue low-carb diet. 5.  Hypertension history- but hypotensive here with sepsis, hold meds. 6.  MDS stable hemoglobin level 8.6, continue to monitor.   He follows at [facility] and this is some injections every 3 weeks as per family.   Because of acute CAD we will give 2 units of blood transfusion to decrease cardiac stress.  Patient is critical when I saw because of sepsis, hypotension, and acute MI- also discussed with cardiologist and the intensivist and transferred to ICU. His wife and daughter were present in the room during my visit and I discussed the plan and CODE STATUS with them.  All the records are reviewed and case discussed with Care Management/Social Workerr. Management plans discussed with the patient, family and they are in agreement.  CODE STATUS: Full.  TOTAL TIME TAKING CARE OF THIS PATIENT: 45 critical care  minutes.     POSSIBLE D/C IN 2-3 DAYS, DEPENDING ON CLINICAL CONDITION.   Vaughan Basta M.D on 08/15/2017   Between 7am to 6pm - Pager - [phone]  After 6pm go to [email or web address] - password [email or web address] [facility]  Office  [phone]  CC: Primary care physician; Physicians, [facility]  Note: This dictation was prepared with Dragon dictation along with smaller phrase technology. Any transcriptional errors that result from this process are unintentional.

## 2017-08-15 NOTE — Progress Notes (Signed)
md gollan at the bedside. md vachhani was notified of bp 80/42. md gollan talking to md vachhani. No further orders at this time.

## 2017-08-15 NOTE — Progress Notes (Signed)
ANTICOAGULATION CONSULT NOTE - Initial Consult  Pharmacy Consult for heparin gtt Indication: chest pain/ACS  Allergies  Allergen Reactions  . Penicillins Swelling    Has patient had a PCN reaction causing immediate rash, facial/tongue/throat swelling, SOB or lightheadedness with hypotension: Yes Has patient had a PCN reaction causing severe rash involving mucus membranes or skin necrosis: No Has patient had a PCN reaction that required hospitalization: No Has patient had a PCN reaction occurring within the last 10 years: No If all of the above answers are "NO", then may proceed with Cephalosporin use.    Patient Measurements: Height: 6' (182.9 cm) Weight: 154 lb 6.4 oz (70 kg) IBW/kg (Calculated) : 77.6 Heparin Dosing Weight: 72.6kg  Vital Signs: Temp: 99.3 F (37.4 C) (01/02 2154) Temp Source: Oral (01/02 2154) BP: 99/54 (01/03 0412) Pulse Rate: 108 (01/03 0412)  Labs: Recent Labs    08/13/17 1358 08/14/17 1833 08/14/17 2347 08/15/17 0530  HGB 10.0* 9.6*  --  8.6*  HCT 30.1* 29.1*  --  26.3*  PLT 119* 87*  --  86*  APTT  --   --  132*  --   LABPROT  --   --  20.5*  --   INR  --   --  1.77  --   HEPARINUNFRC  --   --   --  0.14*  CREATININE 1.00 1.28*  --  1.33*  TROPONINI <0.03 3.22* 2.47*  --     Estimated Creatinine Clearance: 36.5 mL/min (A) (by C-G formula based on SCr of 1.33 mg/dL (H)).   Medical History: Past Medical History:  Diagnosis Date  . Diabetes mellitus without complication ([place])   . Hypertension   . Myelodysplastic syndrome ([place])     Medications:  Medications Prior to Admission  Medication Sig Dispense Refill Last Dose  . azithromycin (ZITHROMAX Z-PAK) 250 MG tablet Take 2 tablets (500 mg) on  Day 1,  followed by 1 tablet (250 mg) once daily on Days 2 through 5. 6 each 0 08/14/2017 at Unknown time  . lisinopril (PRINIVIL,ZESTRIL) 20 MG tablet Take 20 mg by mouth daily.   08/13/2017 at Unknown time  . Multiple Vitamin (MULTI-VITAMINS) TABS  Take 1 tablet by mouth daily.   08/13/2017 at Unknown time  . naproxen sodium (ALEVE) 220 MG tablet Take 220 mg by mouth daily.   prn at prn  . Omega-3 1000 MG CAPS Take 1,000 mg by mouth daily.   prn at prn  . simvastatin (ZOCOR) 10 MG tablet Take 10 mg by mouth daily.   prn at prn   Scheduled:  . aspirin EC  81 mg Oral Daily  . docusate sodium  100 mg Oral BID  . heparin  1,400 Units Intravenous Once  . lisinopril  20 mg Oral Daily  . multivitamin with minerals  1 tablet Oral Daily  . naproxen  250 mg Oral Daily  . omega-3 acid ethyl esters  1,000 mg Oral Daily  . simvastatin  10 mg Oral Daily   Infusions:  . heparin 850 Units/hr (08/14/17 2006)  . levofloxacin (LEVAQUIN) IV     PRN:  Anti-infectives (From admission, onward)   Start     Dose/Rate Route Frequency Ordered Stop   08/15/17 1000  Levofloxacin (LEVAQUIN) IVPB 250 mg     250 mg 50 mL/hr over 60 Minutes Intravenous Every 24 hours 08/14/17 2212     08/14/17 1945  levofloxacin (LEVAQUIN) IVPB 750 mg     750 mg 100 mL/hr over  90 Minutes Intravenous  Once 08/14/17 1944 08/14/17 2211      Assessment: 84  Yo male with acs requiring heparin gtt per pharmacy. Spoke with Dr Reita Cliche regarding thrombocytopenia, with troponin of 3.22 and may be baseline platelets would like to proceed.    Goal of Therapy:  Heparin level 0.3-0.7 units/ml Monitor platelets by anticoagulation protocol: Yes   Plan:  Give 4000 units bolus x 1 Start heparin infusion at 850 units/hr Check anti-Xa level in 8 hours and daily while on heparin Continue to monitor H&H and platelets  01/03 @ 0530 HL 0.14 subtherapeutic. Will rebolus w/ heparin 1400 units IV x 1 and increase rate to 1000 units/hr and will recheck Hl @ 1500. hgb trending down.  Tobie Lords, PharmD, BCPS Clinical Pharmacist 08/15/2017

## 2017-08-15 NOTE — Progress Notes (Signed)
MD Pyreddy made aware of new troponin level, no new orders received. PT denies chest pain, will continue to monitor.

## 2017-08-15 NOTE — Progress Notes (Signed)
md vachhani was made aware of bp 88/44. Lactic acid 2.7. md order to increase ivf to 100. md will come assess pt. Pt has no further concerns at this time.

## 2017-08-15 NOTE — Progress Notes (Signed)
*  PRELIMINARY RESULTS* Echocardiogram 2D Echocardiogram has been performed.  Sherrie Sport 08/15/2017, 10:50 AM

## 2017-08-15 NOTE — Progress Notes (Signed)
MD Duane Boston made aware of new lactic acid level of 2.7, I will add a lactic acid lab. For 0800 as instructed by MD.

## 2017-08-15 NOTE — Progress Notes (Signed)
ANTICOAGULATION CONSULT NOTE - Initial Consult  Pharmacy Consult for heparin drip management  Indication: chest pain/ACS  Allergies  Allergen Reactions  . Penicillins Swelling    Has patient had a PCN reaction causing immediate rash, facial/tongue/throat swelling, SOB or lightheadedness with hypotension: Yes Has patient had a PCN reaction causing severe rash involving mucus membranes or skin necrosis: No Has patient had a PCN reaction that required hospitalization: No Has patient had a PCN reaction occurring within the last 10 years: No If all of the above answers are "NO", then may proceed with Cephalosporin use.    Patient Measurements: Height: 6' (182.9 cm) Weight: 154 lb 6.4 oz (70 kg) IBW/kg (Calculated) : 77.6 Heparin Dosing Weight: 72.6kg  Vital Signs: Temp: 98.1 F (36.7 C) (01/03 1625) Temp Source: Oral (01/03 1625) BP: 102/57 (01/03 1625) Pulse Rate: 67 (01/03 1345)  Labs: Recent Labs    08/13/17 1358 08/14/17 1833 08/14/17 2347 08/15/17 0530 08/15/17 1146 08/15/17 1448  HGB 10.0* 9.6*  --  8.6*  --   --   HCT 30.1* 29.1*  --  26.3*  --   --   PLT 119* 87*  --  86*  --   --   APTT  --   --  132*  --   --   --   LABPROT  --   --  20.5*  --   --   --   INR  --   --  1.77  --   --   --   HEPARINUNFRC  --   --   --  0.14*  --  0.27*  CREATININE 1.00 1.28*  --  1.33*  --   --   TROPONINI <0.03 3.22* 2.47* 9.24* 25.82*  --     Estimated Creatinine Clearance: 36.5 mL/min (A) (by C-G formula based on SCr of 1.33 mg/dL (H)).   Medical History: Past Medical History:  Diagnosis Date  . Diabetes mellitus without complication ([place])   . Hypertension   . Myelodysplastic syndrome ([place])     Medications:  Medications Prior to Admission  Medication Sig Dispense Refill Last Dose  . azithromycin (ZITHROMAX Z-PAK) 250 MG tablet Take 2 tablets (500 mg) on  Day 1,  followed by 1 tablet (250 mg) once daily on Days 2 through 5. 6 each 0 08/14/2017 at Unknown time  .  lisinopril (PRINIVIL,ZESTRIL) 20 MG tablet Take 20 mg by mouth daily.   08/13/2017 at Unknown time  . Multiple Vitamin (MULTI-VITAMINS) TABS Take 1 tablet by mouth daily.   08/13/2017 at Unknown time  . naproxen sodium (ALEVE) 220 MG tablet Take 220 mg by mouth daily.   prn at prn  . Omega-3 1000 MG CAPS Take 1,000 mg by mouth daily.   prn at prn  . simvastatin (ZOCOR) 10 MG tablet Take 10 mg by mouth daily.   prn at prn   Scheduled:  . aspirin EC  81 mg Oral Daily  . budesonide (PULMICORT) nebulizer solution  0.5 mg Nebulization BID  . docusate sodium  100 mg Oral BID  . heparin  1,000 Units Intravenous Once  . ipratropium-albuterol  3 mL Nebulization Q4H  . mouth rinse  15 mL Mouth Rinse BID  . multivitamin with minerals  1 tablet Oral Daily  . naproxen  250 mg Oral Daily  . omega-3 acid ethyl esters  1,000 mg Oral Daily  . [START ON 08/16/2017] predniSONE  20 mg Oral Q breakfast  . simvastatin  10 mg  Oral Daily   Infusions:  . sodium chloride    . heparin 1,000 Units/hr (08/15/17 1434)  . levofloxacin (LEVAQUIN) IV Stopped (08/15/17 1102)   PRN:  Anti-infectives (From admission, onward)   Start     Dose/Rate Route Frequency Ordered Stop   08/15/17 1000  Levofloxacin (LEVAQUIN) IVPB 250 mg     250 mg 50 mL/hr over 60 Minutes Intravenous Every 24 hours 08/14/17 2212     08/14/17 1945  levofloxacin (LEVAQUIN) IVPB 750 mg     750 mg 100 mL/hr over 90 Minutes Intravenous  Once 08/14/17 1944 08/14/17 2211      Assessment: Pharmacy consulted for heparin drip management for 84 yo male admitted with chest pain/ACS. Patient's last troponin 25.82. Patient currently receiving heparin at 1000 units/hr   Goal of Therapy:  Heparin level 0.3-0.7 units/ml Monitor platelets by anticoagulation protocol: Yes   Plan:  Will bolus heparin 1000 units and increase rate 1,150 units/hr. Will recheck anti-xa level at 0100.   Pharmacy will continue to monitor and adjust per consult.    MLS 08/15/2017

## 2017-08-15 NOTE — Progress Notes (Signed)
Dr. Rockey Situ and Dr. Mortimer Fries notified of critical troponin level. Dr. Mortimer Fries to bedside to speak with patient and family regarding blood transfusion. Patient and family agreeable to plan to transfuse 1 unit of PRBCs, consent signed by daughter (HPOA).

## 2017-08-15 NOTE — Progress Notes (Signed)
Report called to sarah rn in icu 10. Family made aware of transfer. No further orders at this time.

## 2017-08-16 ENCOUNTER — Encounter: Payer: Self-pay | Admitting: *Deleted

## 2017-08-16 ENCOUNTER — Inpatient Hospital Stay: Payer: Medicare Other

## 2017-08-16 ENCOUNTER — Encounter
Admission: EM | Disposition: A | Discharge status: Home Health | Payer: Self-pay | Source: Home / Self Care | Attending: Internal Medicine

## 2017-08-16 DIAGNOSIS — I2510 Atherosclerotic heart disease of native coronary artery without angina pectoris: Secondary | ICD-10-CM

## 2017-08-16 DIAGNOSIS — J181 Lobar pneumonia, unspecified organism: Secondary | ICD-10-CM

## 2017-08-16 DIAGNOSIS — I350 Nonrheumatic aortic (valve) stenosis: Secondary | ICD-10-CM

## 2017-08-16 HISTORY — PX: LEFT HEART CATH AND CORONARY ANGIOGRAPHY: CATH118249

## 2017-08-16 LAB — BASIC METABOLIC PANEL
ANION GAP: 10 (ref 5–15)
BUN: 36 mg/dL — ABNORMAL HIGH (ref 6–20)
CHLORIDE: 103 mmol/L (ref 101–111)
CO2: 21 mmol/L — AB (ref 22–32)
Calcium: 8.4 mg/dL — ABNORMAL LOW (ref 8.9–10.3)
Creatinine, Ser: 1.1 mg/dL (ref 0.61–1.24)
GFR calc non Af Amer: 57 mL/min — ABNORMAL LOW (ref >60)
GLUCOSE: 137 mg/dL — AB (ref 65–99)
Potassium: 3.8 mmol/L (ref 3.5–5.1)
Sodium: 134 mmol/L — ABNORMAL LOW (ref 135–145)

## 2017-08-16 LAB — CBC
HEMATOCRIT: 27.8 % — AB (ref 40.0–52.0)
HEMOGLOBIN: 8.9 g/dL — AB (ref 13.0–18.0)
MCH: 34 pg (ref 26.0–34.0)
MCHC: 32.1 g/dL (ref 32.0–36.0)
MCV: 105.9 fL — AB (ref 80.0–100.0)
Platelets: 76 10*3/uL — ABNORMAL LOW (ref 150–440)
RBC: 2.63 MIL/uL — ABNORMAL LOW (ref 4.40–5.90)
RDW: 20.6 % — ABNORMAL HIGH (ref 11.5–14.5)
WBC: 16.4 10*3/uL — AB (ref 3.8–10.6)

## 2017-08-16 LAB — TYPE AND SCREEN
ABO/RH(D): A NEG
Antibody Screen: NEGATIVE
UNIT DIVISION: 0

## 2017-08-16 LAB — BPAM RBC
Blood Product Expiration Date: 201901122359
ISSUE DATE / TIME: 201901031312
Unit Type and Rh: 600

## 2017-08-16 LAB — GLUCOSE, CAPILLARY
GLUCOSE-CAPILLARY: 126 mg/dL — AB (ref 65–99)
Glucose-Capillary: 134 mg/dL — ABNORMAL HIGH (ref 65–99)

## 2017-08-16 LAB — PROCALCITONIN: Procalcitonin: 5.34 ng/mL

## 2017-08-16 LAB — HEPARIN LEVEL (UNFRACTIONATED)
HEPARIN UNFRACTIONATED: 0.33 [IU]/mL (ref 0.30–0.70)
Heparin Unfractionated: 0.28 IU/mL — ABNORMAL LOW (ref 0.30–0.70)

## 2017-08-16 LAB — TROPONIN I: TROPONIN I: 20.26 ng/mL — AB (ref <0.03)

## 2017-08-16 SURGERY — LEFT HEART CATH AND CORONARY ANGIOGRAPHY
Anesthesia: Moderate Sedation

## 2017-08-16 MED ORDER — SODIUM CHLORIDE 0.9% FLUSH: 3.0000 mL | INTRAVENOUS | Status: DC | PRN

## 2017-08-16 MED ORDER — SODIUM CHLORIDE 0.9 % IV SOLN: INTRAVENOUS | Status: DC

## 2017-08-16 MED ORDER — FENTANYL CITRATE (PF) 100 MCG/2ML IJ SOLN
INTRAMUSCULAR | Status: AC
  Filled 2017-08-16: qty 2

## 2017-08-16 MED ORDER — HEPARIN (PORCINE) IN NACL 100-0.45 UNIT/ML-% IJ SOLN
1400.0000 [IU]/h | INTRAMUSCULAR | Status: DC
  Administered 2017-08-16: 1250 [IU]/h via INTRAVENOUS
  Filled 2017-08-16 (×3): qty 250

## 2017-08-16 MED ORDER — MIDAZOLAM HCL 2 MG/2ML IJ SOLN
INTRAMUSCULAR | Status: AC
  Filled 2017-08-16: qty 2

## 2017-08-16 MED ORDER — SODIUM CHLORIDE 0.9% FLUSH: 3.0000 mL | Freq: Two times a day (BID) | INTRAVENOUS | Status: DC

## 2017-08-16 MED ORDER — MUPIROCIN 2 % EX OINT
1.0000 "application " | TOPICAL_OINTMENT | Freq: Two times a day (BID) | CUTANEOUS | Status: DC
  Administered 2017-08-16 – 2017-08-19 (×7): 1 via NASAL
  Filled 2017-08-16: qty 22

## 2017-08-16 MED ORDER — IOPAMIDOL (ISOVUE-300) INJECTION 61%
INTRAVENOUS | Status: DC | PRN
  Administered 2017-08-16: 80 mL via INTRA_ARTERIAL

## 2017-08-16 MED ORDER — SODIUM CHLORIDE 0.9 % IV SOLN: 250.0000 mL | INTRAVENOUS | Status: DC | PRN

## 2017-08-16 MED ORDER — FENTANYL CITRATE (PF) 100 MCG/2ML IJ SOLN
INTRAMUSCULAR | Status: DC | PRN
  Administered 2017-08-16: 12.5 ug via INTRAVENOUS

## 2017-08-16 MED ORDER — MIDAZOLAM HCL 2 MG/2ML IJ SOLN
INTRAMUSCULAR | Status: DC | PRN
  Administered 2017-08-16: 0.5 mg via INTRAVENOUS

## 2017-08-16 MED ORDER — HEPARIN BOLUS VIA INFUSION
1050.0000 [IU] | Freq: Once | INTRAVENOUS | Status: AC
  Administered 2017-08-16: 1050 [IU] via INTRAVENOUS
  Filled 2017-08-16: qty 1050

## 2017-08-16 MED ORDER — CHLORHEXIDINE GLUCONATE CLOTH 2 % EX PADS
6.0000 | MEDICATED_PAD | Freq: Every day | CUTANEOUS | Status: DC
  Administered 2017-08-17 – 2017-08-18 (×2): 6 via TOPICAL

## 2017-08-16 SURGICAL SUPPLY — 11 items
CATH 5FR PIGTAIL DIAGNOSTIC (CATHETERS) ×3 IMPLANT
CATH INFINITI 5FR ANG PIGTAIL (CATHETERS) ×3 IMPLANT
CATH INFINITI 5FR JL4 (CATHETERS) ×3 IMPLANT
CATH INFINITI JR4 5F (CATHETERS) ×3 IMPLANT
DEVICE CLOSURE MYNXGRIP 5F (Vascular Products) ×3 IMPLANT
KIT MANI 3VAL PERCEP (MISCELLANEOUS) ×3 IMPLANT
NEEDLE PERC 18GX7CM (NEEDLE) ×3 IMPLANT
PACK CARDIAC CATH (CUSTOM PROCEDURE TRAY) ×3 IMPLANT
SHEATH AVANTI 5FR X 11CM (SHEATH) ×6 IMPLANT
WIRE EMERALD 3MM-J .035X150CM (WIRE) ×3 IMPLANT
WIRE EMERALD ST .035X150CM (WIRE) ×3 IMPLANT

## 2017-08-16 NOTE — Progress Notes (Signed)
Family Meeting Note  Advance Directive:yes  Today a meeting took place with the Patient and spouse and daughter.  The following clinical team members were present during this meeting:MD  The following were discussed:Patient's diagnosis: sepsis, respi failure, pneumonia, NSTEMI , Patient's progosis: Unable to determine and Goals for treatment: Full Code  Additional follow-up to be provided: Cardio consult and ICU care.  Time spent during discussion:20 minutes  Vaughan Basta, MD

## 2017-08-16 NOTE — Progress Notes (Signed)
[place] for heparin drip management  Indication: chest pain/ACS  Allergies  Allergen Reactions  . Penicillins Swelling    Has patient had a PCN reaction causing immediate rash, facial/tongue/throat swelling, SOB or lightheadedness with hypotension: Yes Has patient had a PCN reaction causing severe rash involving mucus membranes or skin necrosis: No Has patient had a PCN reaction that required hospitalization: No Has patient had a PCN reaction occurring within the last 10 years: No If all of the above answers are "NO", then may proceed with Cephalosporin use.    Patient Measurements: Height: 6' (182.9 cm) Weight: 157 lb (71.2 kg) IBW/kg (Calculated) : 77.6 Heparin Dosing Weight: 72.6kg  Vital Signs: Temp: 97.8 F (36.6 C) (01/04 1002) Temp Source: Oral (01/04 1002) BP: 109/68 (01/04 1230) Pulse Rate: 84 (01/04 1230)  Labs: Recent Labs    08/14/17 1833 08/14/17 2347  08/15/17 0530 08/15/17 1146 08/15/17 1448 08/15/17 1746 08/16/17 0058 08/16/17 0513 08/16/17 0841  HGB 9.6*  --   --  8.6*  --   --   --   --  8.9*  --   HCT 29.1*  --   --  26.3*  --   --   --   --  27.8*  --   PLT 87*  --   --  86*  --   --   --   --  76*  --   APTT  --  132*  --   --   --   --   --   --   --   --   LABPROT  --  20.5*  --   --   --   --   --   --   --   --   INR  --  1.77  --   --   --   --   --   --   --   --   HEPARINUNFRC  --   --    < > 0.14*  --  0.27*  --  0.28*  --  0.33  CREATININE 1.28*  --   --  1.33*  --   --  1.38*  --  1.10  --   TROPONINI 3.22* 2.47*  --  9.24* 25.82*  --  24.15*  --  20.26*  --    < > = values in this interval not displayed.    Estimated Creatinine Clearance: 44.9 mL/min (by C-G formula based on SCr of 1.1 mg/dL).   Medical History: Past Medical History:  Diagnosis Date  . Diabetes mellitus without complication ([place])   . Hypertension   . Myelodysplastic syndrome ([place])     Medications:  Medications Prior  to Admission  Medication Sig Dispense Refill Last Dose  . azithromycin (ZITHROMAX Z-PAK) 250 MG tablet Take 2 tablets (500 mg) on  Day 1,  followed by 1 tablet (250 mg) once daily on Days 2 through 5. 6 each 0 08/14/2017 at Unknown time  . lisinopril (PRINIVIL,ZESTRIL) 20 MG tablet Take 20 mg by mouth daily.   08/13/2017 at Unknown time  . Multiple Vitamin (MULTI-VITAMINS) TABS Take 1 tablet by mouth daily.   08/13/2017 at Unknown time  . naproxen sodium (ALEVE) 220 MG tablet Take 220 mg by mouth daily.   prn at prn  . Omega-3 1000 MG CAPS Take 1,000 mg by mouth daily.   prn at prn  . simvastatin (ZOCOR) 10 MG tablet Take 10 mg  by mouth daily.   prn at prn   Scheduled:  . aspirin EC  81 mg Oral Daily  . budesonide (PULMICORT) nebulizer solution  0.5 mg Nebulization BID  . docusate sodium  100 mg Oral BID  . ipratropium-albuterol  3 mL Nebulization Q4H  . mouth rinse  15 mL Mouth Rinse BID  . multivitamin with minerals  1 tablet Oral Daily  . naproxen  250 mg Oral Daily  . omega-3 acid ethyl esters  1,000 mg Oral Daily  . predniSONE  20 mg Oral Q breakfast  . simvastatin  10 mg Oral Daily   Infusions:  . sodium chloride    . heparin    . levofloxacin (LEVAQUIN) IV Stopped (08/16/17 1025)   PRN:  Anti-infectives (From admission, onward)   Start     Dose/Rate Route Frequency Ordered Stop   08/15/17 1000  Levofloxacin (LEVAQUIN) IVPB 250 mg     250 mg 50 mL/hr over 60 Minutes Intravenous Every 24 hours 08/14/17 2212     08/14/17 1945  levofloxacin (LEVAQUIN) IVPB 750 mg     750 mg 100 mL/hr over 90 Minutes Intravenous  Once 08/14/17 1944 08/14/17 2211      Assessment: Pharmacy consulted for heparin drip management for 84 yo male admitted with chest pain/ACS. Patient's last troponin 25.82. Patient receiving heparin at 1150 units/hr prior to cath.   Goal of Therapy:  Heparin level 0.3-0.7 units/ml Monitor platelets by anticoagulation protocol: Yes   Plan:  Will resume drip at 2000  on 1/4 at 1150 units/hr. Per conversation with cardiology, will not bolus over next 24 hours but can increase infusion rate. Will obtain anti-Xa level at 0400 on 1/5.   Pharmacy will continue to monitor and adjust per consult.   MLS 08/16/2017

## 2017-08-16 NOTE — Progress Notes (Signed)
[facility] NAME: Terry Gonzales    MR#:  [id number]  DATE OF BIRTH:  1925-05-12  SUBJECTIVE:  CHIEF COMPLAINT:   Chief Complaint  Patient presents with  . Pneumonia     Came with worsening respiratory status with cough and chest pain. Found to have pneumonia and sepsis. Running hypotensive. Also have troponin trending upwards.  Seen after cardiac cath in recovery area.  REVIEW OF SYSTEMS:  CONSTITUTIONAL: No fever, fatigue or weakness.  EYES: No blurred or double vision.  EARS, NOSE, AND THROAT: No tinnitus or ear pain.  RESPIRATORY: Positive for cough, shortness of breath, no wheezing or hemoptysis.  CARDIOVASCULAR: Have chest pain, no orthopnea, edema.  GASTROINTESTINAL: No nausea, vomiting, diarrhea or abdominal pain.  GENITOURINARY: No dysuria, hematuria.  ENDOCRINE: No polyuria, nocturia,  HEMATOLOGY: No anemia, easy bruising or bleeding SKIN: No rash or lesion. MUSCULOSKELETAL: No joint pain or arthritis.   NEUROLOGIC: No tingling, numbness, weakness.  PSYCHIATRY: No anxiety or depression.   ROS  DRUG ALLERGIES:   Allergies  Allergen Reactions  . Penicillins Swelling    Has patient had a PCN reaction causing immediate rash, facial/tongue/throat swelling, SOB or lightheadedness with hypotension: Yes Has patient had a PCN reaction causing severe rash involving mucus membranes or skin necrosis: No Has patient had a PCN reaction that required hospitalization: No Has patient had a PCN reaction occurring within the last 10 years: No If all of the above answers are "NO", then may proceed with Cephalosporin use.    VITALS:  Blood pressure 109/68, pulse 84, temperature 97.8 F (36.6 C), temperature source Oral, resp. rate 16, height 6' (1.829 m), weight 71.2 kg (157 lb), SpO2 96 %.  PHYSICAL EXAMINATION:  GENERAL:  84 y.o.-year-old patient lying in the bed with no acute distress.  EYES: Pupils equal, round, reactive to  light and accommodation. No scleral icterus. Extraocular muscles intact.  HEENT: Head atraumatic, normocephalic. Oropharynx and nasopharynx clear.  NECK:  Supple, no jugular venous distention. No thyroid enlargement, no tenderness.  LUNGS: Normal breath sounds bilaterally, no wheezing, have crepitation. No use of accessory muscles of respiration.  CARDIOVASCULAR: S1, S2 normal. No murmurs, rubs, or gallops.  ABDOMEN: Soft, nontender, nondistended. Bowel sounds present. No organomegaly or mass.  EXTREMITIES: No pedal edema, cyanosis, or clubbing.  NEUROLOGIC: Cranial nerves II through XII are intact. Muscle strength 4/5 in all extremities. Sensation intact. Gait not checked.  PSYCHIATRIC: The patient is alert and oriented x 3.  SKIN: No obvious rash, lesion, or ulcer.   Physical Exam LABORATORY PANEL:   CBC Recent Labs  Lab 08/16/17 0513  WBC 16.4*  HGB 8.9*  HCT 27.8*  PLT 76*   ------------------------------------------------------------------------------------------------------------------  Chemistries  Recent Labs  Lab 08/13/17 1358  08/16/17 0513  NA 136   < > 134*  K 4.8   < > 3.8  CL 103   < > 103  CO2 26   < > 21*  GLUCOSE 120*   < > 137*  BUN 20   < > 36*  CREATININE 1.00   < > 1.10  CALCIUM 9.3   < > 8.4*  AST 29  --   --   ALT 12*  --   --   ALKPHOS 98  --   --   BILITOT 1.0  --   --    < > = values in this interval not displayed.   ------------------------------------------------------------------------------------------------------------------  Cardiac Enzymes Recent Labs  Lab 08/15/17 1746 08/16/17 0513  TROPONINI 24.15* 20.26*   ------------------------------------------------------------------------------------------------------------------  RADIOLOGY:  Dg Chest 2 View  Result Date: 08/14/2017 CLINICAL DATA:  Shortness of breath and cough EXAM: CHEST  2 VIEW COMPARISON:  Chest radiograph and chest CT August 13, 2017 FINDINGS: There is stable  patchy opacity in the lung bases, somewhat more on the right than on the left. No new opacity evident elsewhere. Heart is upper normal in size with pulmonary vascularity within normal limits. There is aortic atherosclerosis. There is an old healed fracture of the right clavicle. There is calcification in the left anterior descending and left circumflex coronary artery. IMPRESSION: Patchy bibasilar opacity, at least in part due to atelectasis but based on CT appearance, likely with associated pneumonia and/ or aspiration. Lungs elsewhere clear. Stable cardiac silhouette. There is aortic atherosclerosis. Foci of coronary artery calcification also noted. Aortic Atherosclerosis (ICD10-I70.0). Electronically Signed   By: Lowella Grip III M.D.   On: 08/14/2017 18:58   Dg Chest Port 1 View  Result Date: 08/16/2017 CLINICAL DATA:  Acute respiratory failure EXAM: PORTABLE CHEST 1 VIEW COMPARISON:  08/14/2017 FINDINGS: Shallow inspiration. Cardiac enlargement. Developing pulmonary vascular congestion and perihilar edema since previous study. Asymmetric increased opacity on the right may indicate superimposed pneumonia or asymmetrical edema. No pneumothorax. No pleural effusions. Calcification of the aorta. IMPRESSION: Cardiac enlargement with developing pulmonary vascular congestion and perihilar edema since previous study. Increased opacity on the right lung may indicate superimposed pneumonia or asymmetrical edema. Electronically Signed   By: Lucienne Capers M.D.   On: 08/16/2017 03:17    ASSESSMENT AND PLAN:   Active Problems:   Pneumonia   1.  sepsis,Bilateral pneumonia     Abx, IV fluids, still stays hypotensive, so transferred to stepdown.    BP stable now. 2.  Non-ST elevation MI   heparin drip, cardiology consulted. Monitor for now and once stable ,   Cath done- Have 3 vessel severe disease- and severe Aortic stenosis.   Dr. Rockey Situ spoke to family- plan is for conservative medical  management. 3.  Acute renal failure likely prerenal secondary to dehydration, will start IV fluids and monitor kidney function daily. Better now, 4.  Diabetes type 2 stable continue low-carb diet. 5.  Hypertension history- but hypotensive here with sepsis, hold meds. 6.  MDS stable hemoglobin level 8.6, continue to monitor.   He follows at [facility] and this is some injections every 3 weeks as per family.   Blood transfusion to keep Hb > 9  Patient is critical when I saw because of sepsis, hypotension, and acute MI- also discussed with cardiologist and the intensivist and transferred to ICU. His wife and daughter were present in the room during my visit and I discussed the plan and CODE STATUS with them.  All the records are reviewed and case discussed with Care Management/Social Workerr. Management plans discussed with the patient, family and they are in agreement.  CODE STATUS: Full.  TOTAL TIME TAKING CARE OF THIS PATIENT: 45   minutes.   POSSIBLE D/C IN 2-3 DAYS, DEPENDING ON CLINICAL CONDITION.   Vaughan Basta M.D on 08/16/2017   Between 7am to 6pm - Pager - [phone]  After 6pm go to [email or web address] - password [email or web address] [facility]  Office  [phone]  CC: Primary care physician; Physicians, [facility]  Note: This dictation was prepared with Dragon dictation along with smaller phrase technology. Any transcriptional errors that result from this process are unintentional.

## 2017-08-16 NOTE — Progress Notes (Signed)
Patient admitted for sepsis from pneumonia, NSTEMI Transferred to [place] for low BP-VS stable now   Assessment- BP improved, no resp distress  Plan- Transfuse 1 units PRBC's Follow up cardiology recs-plan for cath today Continue IV abx Consider Starting Steroids and ABX BD therapy  Will follow along.    Corrin Parker, M.D.  [facility]  Medical Director [place] Director [facility] Cardio-Pulmonary Department

## 2017-08-16 NOTE — Progress Notes (Signed)
Progress Note  Patient Name: Terry Gonzales Date of Encounter: 08/16/2017  Primary Cardiologist: new to [facility]  Subjective   Cath this Am Three vessel disease Aortic valve stenosis Still coughing, Hypoxia even on several liters nasal canula, into the 80s  Inpatient Medications    Scheduled Meds: . [MAR Hold] aspirin EC  81 mg Oral Daily  . [MAR Hold] budesonide (PULMICORT) nebulizer solution  0.5 mg Nebulization BID  . [MAR Hold] docusate sodium  100 mg Oral BID  . [MAR Hold] ipratropium-albuterol  3 mL Nebulization Q4H  . [MAR Hold] mouth rinse  15 mL Mouth Rinse BID  . [MAR Hold] multivitamin with minerals  1 tablet Oral Daily  . [MAR Hold] naproxen  250 mg Oral Daily  . [MAR Hold] omega-3 acid ethyl esters  1,000 mg Oral Daily  . [MAR Hold] predniSONE  20 mg Oral Q breakfast  . [MAR Hold] simvastatin  10 mg Oral Daily  . sodium chloride flush  3 mL Intravenous Q12H   Continuous Infusions: . [MAR Hold] sodium chloride    . sodium chloride    . [START ON 08/17/2017] sodium chloride    . heparin 1,250 Units/hr (08/16/17 0246)  . [MAR Hold] levofloxacin (LEVAQUIN) IV 250 mg (08/16/17 0923)   PRN Meds: sodium chloride, [MAR Hold] acetaminophen **OR** [MAR Hold] acetaminophen, [MAR Hold] bisacodyl, [MAR Hold] guaiFENesin, [MAR Hold] HYDROcodone-acetaminophen, [MAR Hold] ondansetron **OR** [MAR Hold] ondansetron (ZOFRAN) IV, sodium chloride flush, [MAR Hold] traZODone   Vital Signs    Vitals:   08/16/17 1002 08/16/17 1200 08/16/17 1205 08/16/17 1215  BP: (!) 110/59 104/65 104/65 107/63  Pulse: 85 75 76 86  Resp: 17 19 18 15   Temp: 97.8 F (36.6 C)     TempSrc: Oral     SpO2: 94% 95% 95% 96%  Weight: 157 lb (71.2 kg)     Height: 6' (1.829 m)       Intake/Output Summary (Last 24 hours) at 08/16/2017 1221 Last data filed at 08/16/2017 0600 Gross per 24 hour  Intake 630.41 ml  Output 325 ml  Net 305.41 ml   Filed Weights   08/15/17 0412 08/16/17 0249 08/16/17 1002    Weight: 154 lb 6.4 oz (70 kg) 157 lb 13.6 oz (71.6 kg) 157 lb (71.2 kg)    Telemetry    NSR - Personally Reviewed  ECG      Physical Exam   GEN: No acute distress. Thin, weak  Neck: No JVD Cardiac: RRR, no murmurs, rubs, or gallops.  Respiratory: Rales , coarse bs GI: Soft, nontender, non-distended , thin MS: No edema; No deformity. Neuro:  Nonfocal  Psych: Normal affect   Labs    Chemistry Recent Labs  Lab 08/13/17 1358 08/14/17 1833 08/15/17 0530 08/15/17 1746 08/16/17 0513  NA 136 133* 133*  --  134*  K 4.8 4.4 3.9  --  3.8  CL 103 102 103  --  103  CO2 26 22 21*  --  21*  GLUCOSE 120* 136* 130*  --  137*  BUN 20 30* 31*  --  36*  CREATININE 1.00 1.28* 1.33* 1.38* 1.10  CALCIUM 9.3 8.9 8.5*  --  8.4*  PROT 7.3  --   --   --   --   ALBUMIN 3.8  --   --   --   --   AST 29  --   --   --   --   ALT 12*  --   --   --   --  ALKPHOS 98  --   --   --   --   BILITOT 1.0  --   --   --   --   GFRNONAA >60 48* 45* 43* 57*  GFRAA >60 55* 53* 50* >60  ANIONGAP 7 9 9   --  10     Hematology Recent Labs  Lab 08/14/17 1833 08/15/17 0530 08/16/17 0513  WBC 5.6 9.0 16.4*  RBC 2.65* 2.39* 2.63*  HGB 9.6* 8.6* 8.9*  HCT 29.1* 26.3* 27.8*  MCV 109.7* 110.1* 105.9*  MCH 36.3* 35.8* 34.0  MCHC 33.1 32.5 32.1  RDW 17.5* 17.4* 20.6*  PLT 87* 86* 76*    Cardiac Enzymes Recent Labs  Lab 08/15/17 0530 08/15/17 1146 08/15/17 1746 08/16/17 0513  TROPONINI 9.24* 25.82* 24.15* 20.26*   No results for input(s): TROPIPOC in the last 168 hours.   BNPNo results for input(s): BNP, PROBNP in the last 168 hours.   DDimer No results for input(s): DDIMER in the last 168 hours.   Radiology    Dg Chest 2 View  Result Date: 08/14/2017 CLINICAL DATA:  Shortness of breath and cough EXAM: CHEST  2 VIEW COMPARISON:  Chest radiograph and chest CT August 13, 2017 FINDINGS: There is stable patchy opacity in the lung bases, somewhat more on the right than on the left. No new  opacity evident elsewhere. Heart is upper normal in size with pulmonary vascularity within normal limits. There is aortic atherosclerosis. There is an old healed fracture of the right clavicle. There is calcification in the left anterior descending and left circumflex coronary artery. IMPRESSION: Patchy bibasilar opacity, at least in part due to atelectasis but based on CT appearance, likely with associated pneumonia and/ or aspiration. Lungs elsewhere clear. Stable cardiac silhouette. There is aortic atherosclerosis. Foci of coronary artery calcification also noted. Aortic Atherosclerosis (ICD10-I70.0). Electronically Signed   By: Lowella Grip III M.D.   On: 08/14/2017 18:58   Dg Chest Port 1 View  Result Date: 08/16/2017 CLINICAL DATA:  Acute respiratory failure EXAM: PORTABLE CHEST 1 VIEW COMPARISON:  08/14/2017 FINDINGS: Shallow inspiration. Cardiac enlargement. Developing pulmonary vascular congestion and perihilar edema since previous study. Asymmetric increased opacity on the right may indicate superimposed pneumonia or asymmetrical edema. No pneumothorax. No pleural effusions. Calcification of the aorta. IMPRESSION: Cardiac enlargement with developing pulmonary vascular congestion and perihilar edema since previous study. Increased opacity on the right lung may indicate superimposed pneumonia or asymmetrical edema. Electronically Signed   By: Lucienne Capers M.D.   On: 08/16/2017 03:17    Cardiac Studies   Echocardiogram with ejection fraction 30-35% anterior wall hypokinesis  Patient Profile     84 y.o. male with MDS chronic anemia/thrombocytopenia, no prior cardiac disease presenting with coughing shortness of breath upper respiratory tract infection concerning for pneumonia, non-STEMI Echocardiogram concerning for anterior wall MI, possibly old given mild scarring  Assessment & Plan    NSTEMI Cardiac catheterization today Severe three-vessel coronary disease Significant aortic  valve stenosis on echo Long discussion with family, do not think he would be a CABG candidate given 84 years old and underlying mds Recommend we continue with supportive care for underlying pneumonia or respiratory distress We will restart heparin infusion tonight Further discussions with family this weekend We will not transfer patient to tertiary care facility at this time  2) MDS Anemia, thrombocytopenia Managed at [facility] interested in hematology follow-up at [facility].  As outpatient  3) hypoxia/respiratory distress Secondary to pneumonia, sepsis Would continue antibiotics,  aggressive nebulizer respiratory support Saturations into the 80s even on nasal cannula oxygen  4) Sepsis On antibiotics, stable  Not on pressors  5) acute renal failure Improved renal function With monitor after cardiac catheterization  6) aortic valve stenosis At least moderate if not severe Unable to cross the aortic valve on catheterization If intervention is elected by family, would need TEE for further estimation of valve stenosis  Long discussion with family concerning goals of care for his coronary disease and aortic valve stenosis They understand he would be high risk for sudden death if he underwent CABG They are leaning towards medical management  Total encounter time more than 35 minutes  Greater than 50% was spent in counseling and coordination of care with the patient   For questions or updates, please contact [facility] Please consult [email or web address] for contact info under Cardiology/STEMI.      Signed, Ida Rogue, MD  08/16/2017, 12:21 PM

## 2017-08-16 NOTE — Progress Notes (Signed)
Patient returned to ICU from specials recovery. Pt has no complaints of pain at this time. Vital signs stable and WNL. Pt asking to sit up, explained to pt that he will be able to sit up at 1300, pt verbalized understanding. Right groin access site has no signs of bruising or bleeding, bilateral pedal pulses equal and +1. Will continue to monitor.

## 2017-08-16 NOTE — Progress Notes (Signed)
Patient taken to cath lab

## 2017-08-16 NOTE — Progress Notes (Signed)
Progress Note  Patient Name: Terry Gonzales Date of Encounter: 08/16/2017  Primary Cardiologist: new to [place]   Slept well overnight Scant chest pain, nothing significant No complaints this morning On 3-4 L nasal cannula, saturations in the 90s Coughing moderately improved Blood pressure stable 902 systolic on no pressors Family at the bedside  Inpatient Medications    Scheduled Meds: . aspirin EC  81 mg Oral Daily  . budesonide (PULMICORT) nebulizer solution  0.5 mg Nebulization BID  . docusate sodium  100 mg Oral BID  . ipratropium-albuterol  3 mL Nebulization Q4H  . mouth rinse  15 mL Mouth Rinse BID  . multivitamin with minerals  1 tablet Oral Daily  . naproxen  250 mg Oral Daily  . omega-3 acid ethyl esters  1,000 mg Oral Daily  . predniSONE  20 mg Oral Q breakfast  . simvastatin  10 mg Oral Daily   Continuous Infusions: . sodium chloride    . heparin 1,250 Units/hr (08/16/17 0246)  . levofloxacin (LEVAQUIN) IV Stopped (08/15/17 1102)   PRN Meds: acetaminophen **OR** acetaminophen, bisacodyl, guaiFENesin, HYDROcodone-acetaminophen, ondansetron **OR** ondansetron (ZOFRAN) IV, traZODone   Vital Signs    Vitals:   08/16/17 0432 08/16/17 0500 08/16/17 0600 08/16/17 0700  BP:  104/60 (!) 109/57 (!) 98/55  Pulse:  68 68 61  Resp:  19 18 17   Temp:      TempSrc:      SpO2: 96% 95% 95% 92%  Weight:      Height:        Intake/Output Summary (Last 24 hours) at 08/16/2017 0753 Last data filed at 08/16/2017 0600 Gross per 24 hour  Intake 630.41 ml  Output 325 ml  Net 305.41 ml   Filed Weights   08/14/17 2200 08/15/17 0412 08/16/17 0249  Weight: 156 lb 11.2 oz (71.1 kg) 154 lb 6.4 oz (70 kg) 157 lb 13.6 oz (71.6 kg)    Telemetry    NSR - Personally Reviewed  ECG      Physical Exam   GEN: No acute distress. Thin, weak  Neck: No JVD Cardiac: RRR, no murmurs, rubs, or gallops.  Respiratory: Rales at the bases, otherwise clear GI: Soft,  nontender, non-distended , thin MS: No edema; No deformity. Neuro:  Nonfocal  Psych: Normal affect   Labs    Chemistry Recent Labs  Lab 08/13/17 1358 08/14/17 1833 08/15/17 0530 08/15/17 1746 08/16/17 0513  NA 136 133* 133*  --  134*  K 4.8 4.4 3.9  --  3.8  CL 103 102 103  --  103  CO2 26 22 21*  --  21*  GLUCOSE 120* 136* 130*  --  137*  BUN 20 30* 31*  --  36*  CREATININE 1.00 1.28* 1.33* 1.38* 1.10  CALCIUM 9.3 8.9 8.5*  --  8.4*  PROT 7.3  --   --   --   --   ALBUMIN 3.8  --   --   --   --   AST 29  --   --   --   --   ALT 12*  --   --   --   --   ALKPHOS 98  --   --   --   --   BILITOT 1.0  --   --   --   --   GFRNONAA >60 48* 45* 43* 57*  GFRAA >60 55* 53* 50* >60  ANIONGAP 7 9 9   --  10     Hematology Recent Labs  Lab 08/14/17 1833 08/15/17 0530 08/16/17 0513  WBC 5.6 9.0 16.4*  RBC 2.65* 2.39* 2.63*  HGB 9.6* 8.6* 8.9*  HCT 29.1* 26.3* 27.8*  MCV 109.7* 110.1* 105.9*  MCH 36.3* 35.8* 34.0  MCHC 33.1 32.5 32.1  RDW 17.5* 17.4* 20.6*  PLT 87* 86* 76*    Cardiac Enzymes Recent Labs  Lab 08/14/17 2347 08/15/17 0530 08/15/17 1146 08/15/17 1746  TROPONINI 2.47* 9.24* 25.82* 24.15*   No results for input(s): TROPIPOC in the last 168 hours.   BNPNo results for input(s): BNP, PROBNP in the last 168 hours.   DDimer No results for input(s): DDIMER in the last 168 hours.   Radiology    Dg Chest 2 View  Result Date: 08/14/2017 CLINICAL DATA:  Shortness of breath and cough EXAM: CHEST  2 VIEW COMPARISON:  Chest radiograph and chest CT August 13, 2017 FINDINGS: There is stable patchy opacity in the lung bases, somewhat more on the right than on the left. No new opacity evident elsewhere. Heart is upper normal in size with pulmonary vascularity within normal limits. There is aortic atherosclerosis. There is an old healed fracture of the right clavicle. There is calcification in the left anterior descending and left circumflex coronary artery. IMPRESSION:  Patchy bibasilar opacity, at least in part due to atelectasis but based on CT appearance, likely with associated pneumonia and/ or aspiration. Lungs elsewhere clear. Stable cardiac silhouette. There is aortic atherosclerosis. Foci of coronary artery calcification also noted. Aortic Atherosclerosis (ICD10-I70.0). Electronically Signed   By: Lowella Grip III M.D.   On: 08/14/2017 18:58   Dg Chest Port 1 View  Result Date: 08/16/2017 CLINICAL DATA:  Acute respiratory failure EXAM: PORTABLE CHEST 1 VIEW COMPARISON:  08/14/2017 FINDINGS: Shallow inspiration. Cardiac enlargement. Developing pulmonary vascular congestion and perihilar edema since previous study. Asymmetric increased opacity on the right may indicate superimposed pneumonia or asymmetrical edema. No pneumothorax. No pleural effusions. Calcification of the aorta. IMPRESSION: Cardiac enlargement with developing pulmonary vascular congestion and perihilar edema since previous study. Increased opacity on the right lung may indicate superimposed pneumonia or asymmetrical edema. Electronically Signed   By: Lucienne Capers M.D.   On: 08/16/2017 03:17    Cardiac Studies   Echocardiogram with ejection fraction 30-35% anterior wall hypokinesis  Patient Profile     84 y.o. male with MDS chronic anemia/thrombocytopenia, no prior cardiac disease presenting with coughing shortness of breath upper respiratory tract infection concerning for pneumonia, non-STEMI Echocardiogram concerning for anterior wall MI, possibly old given mild scarring  Assessment & Plan    NSTEMI Troponin peaked around 25 He is on heparin infusion  Based on echocardiogram pictures, suspect he has LAD disease, unable to exclude old anterior MI Currently with no chest pain symptoms Plan is for cardiac catheterization this morning I have reviewed the risks, indications, and alternatives to cardiac catheterization, possible angioplasty, and stenting with the patient. Risks  include but are not limited to bleeding, infection, vascular injury, stroke, myocardial infection, arrhythmia, kidney injury, radiation-related injury in the case of prolonged fluoroscopy use, emergency cardiac surgery, and death. The patient understands the risks of serious complication is 1-2 in 8527 with diagnostic cardiac cath and 1-2% or less with angioplasty/stenting.   2) MDS Anemia, thrombocytopenia Managed at [facility]  3) hypoxia/respiratory distress Secondary to pneumonia, sepsis 70% on room air by report yesterday , up to low 90s% on several liters nasal cannula He may benefit from nebulizer treatment,  antibiotics  4) Sepsis On antibiotics, stable overnight  5) acute renal failure Creatinine mildly elevated 1.33 withBUN 46 Yesterday Down to baseline today   Total encounter time more than 35 minutes  Greater than 50% was spent in counseling and coordination of care with the patient   For questions or updates, please contact [place] Please consult [email or web address] for contact info under Cardiology/STEMI.      Signed, Ida Rogue, MD  08/16/2017, 7:53 AM

## 2017-08-16 NOTE — Progress Notes (Signed)
ANTICOAGULATION CONSULT NOTE - Initial Consult  Pharmacy Consult for heparin drip management  Indication: chest pain/ACS  Allergies  Allergen Reactions  . Penicillins Swelling    Has patient had a PCN reaction causing immediate rash, facial/tongue/throat swelling, SOB or lightheadedness with hypotension: Yes Has patient had a PCN reaction causing severe rash involving mucus membranes or skin necrosis: No Has patient had a PCN reaction that required hospitalization: No Has patient had a PCN reaction occurring within the last 10 years: No If all of the above answers are "NO", then may proceed with Cephalosporin use.    Patient Measurements: Height: 6' (182.9 cm) Weight: 154 lb 6.4 oz (70 kg) IBW/kg (Calculated) : 77.6 Heparin Dosing Weight: 72.6kg  Vital Signs: Temp: 98.7 F (37.1 C) (01/04 0154) Temp Source: Axillary (01/04 0154) BP: 103/57 (01/04 0100) Pulse Rate: 69 (01/04 0154)  Labs: Recent Labs    08/13/17 1358 08/14/17 1833 08/14/17 2347 08/15/17 0530 08/15/17 1146 08/15/17 1448 08/15/17 1746 08/16/17 0058  HGB 10.0* 9.6*  --  8.6*  --   --   --   --   HCT 30.1* 29.1*  --  26.3*  --   --   --   --   PLT 119* 87*  --  86*  --   --   --   --   APTT  --   --  132*  --   --   --   --   --   LABPROT  --   --  20.5*  --   --   --   --   --   INR  --   --  1.77  --   --   --   --   --   HEPARINUNFRC  --   --   --  0.14*  --  0.27*  --  0.28*  CREATININE 1.00 1.28*  --  1.33*  --   --  1.38*  --   TROPONINI <0.03 3.22* 2.47* 9.24* 25.82*  --  24.15*  --     Estimated Creatinine Clearance: 35.2 mL/min (A) (by C-G formula based on SCr of 1.38 mg/dL (H)).   Medical History: Past Medical History:  Diagnosis Date  . Diabetes mellitus without complication ([place])   . Hypertension   . Myelodysplastic syndrome ([place])     Medications:  Medications Prior to Admission  Medication Sig Dispense Refill Last Dose  . azithromycin (ZITHROMAX Z-PAK) 250 MG tablet Take 2  tablets (500 mg) on  Day 1,  followed by 1 tablet (250 mg) once daily on Days 2 through 5. 6 each 0 08/14/2017 at Unknown time  . lisinopril (PRINIVIL,ZESTRIL) 20 MG tablet Take 20 mg by mouth daily.   08/13/2017 at Unknown time  . Multiple Vitamin (MULTI-VITAMINS) TABS Take 1 tablet by mouth daily.   08/13/2017 at Unknown time  . naproxen sodium (ALEVE) 220 MG tablet Take 220 mg by mouth daily.   prn at prn  . Omega-3 1000 MG CAPS Take 1,000 mg by mouth daily.   prn at prn  . simvastatin (ZOCOR) 10 MG tablet Take 10 mg by mouth daily.   prn at prn   Scheduled:  . aspirin EC  81 mg Oral Daily  . budesonide (PULMICORT) nebulizer solution  0.5 mg Nebulization BID  . docusate sodium  100 mg Oral BID  . heparin  1,050 Units Intravenous Once  . ipratropium-albuterol  3 mL Nebulization Q4H  . mouth rinse  15 mL Mouth Rinse BID  . multivitamin with minerals  1 tablet Oral Daily  . naproxen  250 mg Oral Daily  . omega-3 acid ethyl esters  1,000 mg Oral Daily  . predniSONE  20 mg Oral Q breakfast  . simvastatin  10 mg Oral Daily   Infusions:  . sodium chloride    . heparin 1,150 Units/hr (08/15/17 2000)  . levofloxacin (LEVAQUIN) IV Stopped (08/15/17 1102)   PRN:  Anti-infectives (From admission, onward)   Start     Dose/Rate Route Frequency Ordered Stop   08/15/17 1000  Levofloxacin (LEVAQUIN) IVPB 250 mg     250 mg 50 mL/hr over 60 Minutes Intravenous Every 24 hours 08/14/17 2212     08/14/17 1945  levofloxacin (LEVAQUIN) IVPB 750 mg     750 mg 100 mL/hr over 90 Minutes Intravenous  Once 08/14/17 1944 08/14/17 2211      Assessment: Pharmacy consulted for heparin drip management for 84 yo male admitted with chest pain/ACS. Patient's last troponin 25.82. Patient currently receiving heparin at 1000 units/hr   Goal of Therapy:  Heparin level 0.3-0.7 units/ml Monitor platelets by anticoagulation protocol: Yes   Plan:  Will bolus heparin 1000 units and increase rate 1,150 units/hr. Will  recheck anti-xa level at 0100.   01/04 @ 0100 HL 0.28 subtherapeutic. Will rebolus w/ heparin 1050 units IV x 1 and will increase rate to 1250 units/hr and will recheck HL @ 0900 will monitor CBC w/ am labs, hgb dropped one unit  Tobie Lords, PharmD, BCPS Clinical Pharmacist 08/16/2017

## 2017-08-17 DIAGNOSIS — I350 Nonrheumatic aortic (valve) stenosis: Secondary | ICD-10-CM

## 2017-08-17 DIAGNOSIS — I5041 Acute combined systolic (congestive) and diastolic (congestive) heart failure: Secondary | ICD-10-CM

## 2017-08-17 DIAGNOSIS — I214 Non-ST elevation (NSTEMI) myocardial infarction: Secondary | ICD-10-CM

## 2017-08-17 LAB — BASIC METABOLIC PANEL
Anion gap: 7 (ref 5–15)
BUN: 31 mg/dL — ABNORMAL HIGH (ref 6–20)
CALCIUM: 8.2 mg/dL — AB (ref 8.9–10.3)
CO2: 24 mmol/L (ref 22–32)
CREATININE: 0.98 mg/dL (ref 0.61–1.24)
Chloride: 104 mmol/L (ref 101–111)
GFR calc Af Amer: >60 mL/min (ref >60)
GLUCOSE: 107 mg/dL — AB (ref 65–99)
Potassium: 3.4 mmol/L — ABNORMAL LOW (ref 3.5–5.1)
SODIUM: 135 mmol/L (ref 135–145)

## 2017-08-17 LAB — HEPARIN LEVEL (UNFRACTIONATED): Heparin Unfractionated: 0.18 IU/mL — ABNORMAL LOW (ref 0.30–0.70)

## 2017-08-17 LAB — CBC
HCT: 26.9 % — ABNORMAL LOW (ref 40.0–52.0)
Hemoglobin: 9 g/dL — ABNORMAL LOW (ref 13.0–18.0)
MCH: 35.6 pg — AB (ref 26.0–34.0)
MCHC: 33.5 g/dL (ref 32.0–36.0)
MCV: 106.2 fL — ABNORMAL HIGH (ref 80.0–100.0)
PLATELETS: 84 10*3/uL — AB (ref 150–440)
RBC: 2.53 MIL/uL — AB (ref 4.40–5.90)
RDW: 20 % — AB (ref 11.5–14.5)
WBC: 9.7 10*3/uL (ref 3.8–10.6)

## 2017-08-17 LAB — PROCALCITONIN: PROCALCITONIN: 3.5 ng/mL

## 2017-08-17 LAB — GLUCOSE, CAPILLARY
Glucose-Capillary: 123 mg/dL — ABNORMAL HIGH (ref 65–99)
Glucose-Capillary: 88 mg/dL (ref 65–99)

## 2017-08-17 LAB — PHOSPHORUS: Phosphorus: 2.3 mg/dL — ABNORMAL LOW (ref 2.5–4.6)

## 2017-08-17 LAB — MAGNESIUM: MAGNESIUM: 2.1 mg/dL (ref 1.7–2.4)

## 2017-08-17 MED ORDER — POTASSIUM CHLORIDE 20 MEQ PO PACK
60.0000 meq | PACK | Freq: Once | ORAL | Status: AC
  Administered 2017-08-17: 60 meq via ORAL
  Filled 2017-08-17: qty 3

## 2017-08-17 MED ORDER — PROMETHAZINE-CODEINE 6.25-10 MG/5ML PO SYRP
5.0000 mL | ORAL_SOLUTION | Freq: Every evening | ORAL | Status: DC | PRN
  Filled 2017-08-17: qty 5

## 2017-08-17 MED ORDER — LEVOFLOXACIN IN D5W 250 MG/50ML IV SOLN
250.0000 mg | Freq: Once | INTRAVENOUS | Status: AC
  Administered 2017-08-17: 250 mg via INTRAVENOUS
  Filled 2017-08-17: qty 50

## 2017-08-17 MED ORDER — METOPROLOL SUCCINATE ER 25 MG PO TB24
12.5000 mg | ORAL_TABLET | Freq: Every day | ORAL | Status: DC
  Administered 2017-08-17 – 2017-08-19 (×3): 12.5 mg via ORAL
  Filled 2017-08-17: qty 1
  Filled 2017-08-17 (×2): qty 0.5

## 2017-08-17 MED ORDER — K PHOS MONO-SOD PHOS DI & MONO 155-852-130 MG PO TABS
500.0000 mg | ORAL_TABLET | Freq: Once | ORAL | Status: AC
  Administered 2017-08-17: 500 mg via ORAL
  Filled 2017-08-17: qty 2

## 2017-08-17 MED ORDER — LEVOFLOXACIN IN D5W 500 MG/100ML IV SOLN
500.0000 mg | INTRAVENOUS | Status: DC
  Administered 2017-08-18 – 2017-08-19 (×2): 500 mg via INTRAVENOUS
  Filled 2017-08-17 (×3): qty 100

## 2017-08-17 NOTE — Progress Notes (Signed)
[facility] for heparin drip management  Indication: chest pain/ACS  Allergies  Allergen Reactions  . Penicillins Swelling    Has patient had a PCN reaction causing immediate rash, facial/tongue/throat swelling, SOB or lightheadedness with hypotension: Yes Has patient had a PCN reaction causing severe rash involving mucus membranes or skin necrosis: No Has patient had a PCN reaction that required hospitalization: No Has patient had a PCN reaction occurring within the last 10 years: No If all of the above answers are "NO", then may proceed with Cephalosporin use.    Patient Measurements: Height: 6' (182.9 cm) Weight: 157 lb (71.2 kg) IBW/kg (Calculated) : 77.6 Heparin Dosing Weight: 71.2 kg  Vital Signs: Temp: 98.2 F (36.8 C) (01/05 1400) Temp Source: Oral (01/05 1400) BP: 108/55 (01/05 1506) Pulse Rate: 87 (01/05 1506)  Labs: Recent Labs    08/14/17 2347 08/15/17 0530 08/15/17 1146  08/15/17 1746  08/16/17 0513 08/16/17 0841 08/17/17 0438 08/17/17 1440  HGB  --  8.6*  --   --   --   --  8.9*  --  9.0*  --   HCT  --  26.3*  --   --   --   --  27.8*  --  26.9*  --   PLT  --  86*  --   --   --   --  76*  --  84*  --   APTT 132*  --   --   --   --   --   --   --   --   --   LABPROT 20.5*  --   --   --   --   --   --   --   --   --   INR 1.77  --   --   --   --   --   --   --   --   --   HEPARINUNFRC  --  0.14*  --    < >  --    < >  --  0.33 <0.10* 0.18*  CREATININE  --  1.33*  --   --  1.38*  --  1.10  --  0.98  --   TROPONINI 2.47* 9.24* 25.82*  --  24.15*  --  20.26*  --   --   --    < > = values in this interval not displayed.    Estimated Creatinine Clearance: 50.5 mL/min (by C-G formula based on SCr of 0.98 mg/dL).   Assessment: Pharmacy consulted for heparin drip management for 84 yo male admitted with chest pain/ACS. Patient's last troponin 25.82. Patient receiving heparin at 1150 units/hr prior to cath.   Goal of  Therapy:  Heparin level 0.3-0.7 units/ml Monitor platelets by anticoagulation protocol: Yes   Plan:  Will resume drip at 2000 on 1/4 at 1150 units/hr. Per conversation with cardiology, will not bolus over next 24 hours but can increase infusion rate. Will obtain anti-Xa level at 0400 on 1/5.   1/5 at 1440 HL 0.18, subtherapeutic. Hgb and plt count stable. Cardiology has stopped heparin drip as it has been >48 hours    Rayna Sexton, PharmD, BCPS Clinical Pharmacist 08/17/2017 3:39 PM

## 2017-08-17 NOTE — Plan of Care (Signed)
Patient alert and able to make needs known.  Continues on heparin drip at this time. PRN pain medication given for headache x 2 with good relief. PRN med given for cough x 1.  Patient awake most of shift. Urine output 360 this shift.   No acute distress noted.  Will continue to monitor.

## 2017-08-17 NOTE — Progress Notes (Signed)
PHARMACY NOTE:  ANTIMICROBIAL RENAL DOSAGE ADJUSTMENT  Current antimicrobial regimen includes a mismatch between antimicrobial dosage and estimated renal function.  As per policy approved by the Pharmacy & Therapeutics and Medical Executive Committees, the antimicrobial dosage will be adjusted accordingly.  Current antimicrobial dosage:  Levofloxacin 250 mg IV q24h  Indication: PNA  Renal Function:  Estimated Creatinine Clearance: 50.5 mL/min (by C-G formula based on SCr of 0.98 mg/dL).     Antimicrobial dosage has been changed to:  Levaquin 250 mg IV x1 today to complete 500 mg dose today, then 500 mg IV q24h starting tomorrow    Thank you for allowing pharmacy to be a part of this patient's care.  Rocky Morel, [facility] 08/17/2017 2:22 PM

## 2017-08-17 NOTE — Progress Notes (Signed)
Patient admitted for sepsis from pneumonia, NSTEMI S/p cath-3VD-medical management Transferred to SD for low BP-VS stable now   Assessment- BP improved, no resp distress  Plan- Follow up cardiology recs- Continue IV abx Oral Steroids and ABX BD therapy  Transfer to gen med floor.    Corrin Parker, M.D.  [facility]  Medical Director [place] Director [facility] Cardio-Pulmonary Department

## 2017-08-17 NOTE — Progress Notes (Signed)
[place] [facility] [place] NAME: Terry Gonzales    MR#:  [id number]  DATE OF BIRTH:  [birth year]  SUBJECTIVE:  CHIEF COMPLAINT:   Chief Complaint  Patient presents with  . Pneumonia     Came with worsening respiratory status with cough and chest pain. Found to have pneumonia and sepsis. Running hypotensive. Also have troponin trending upwards.  Status post cardiac catheterization, no much pain but have generalized weakness complaints today. Hemoglobin, blood pressure, oxygenation are all stable.   REVIEW OF SYSTEMS:  CONSTITUTIONAL: No fever, fatigue or weakness.  EYES: No blurred or double vision.  EARS, NOSE, AND THROAT: No tinnitus or ear pain.  RESPIRATORY: Positive for cough, shortness of breath, no wheezing or hemoptysis.  CARDIOVASCULAR: Have chest pain, no orthopnea, edema.  GASTROINTESTINAL: No nausea, vomiting, diarrhea or abdominal pain.  GENITOURINARY: No dysuria, hematuria.  ENDOCRINE: No polyuria, nocturia,  HEMATOLOGY: No anemia, easy bruising or bleeding SKIN: No rash or lesion. MUSCULOSKELETAL: No joint pain or arthritis.   NEUROLOGIC: No tingling, numbness, weakness.  PSYCHIATRY: No anxiety or depression.   ROS  DRUG ALLERGIES:   Allergies  Allergen Reactions  . Penicillins Swelling    Has patient had a PCN reaction causing immediate rash, facial/tongue/throat swelling, SOB or lightheadedness with hypotension: Yes Has patient had a PCN reaction causing severe rash involving mucus membranes or skin necrosis: No Has patient had a PCN reaction that required hospitalization: No Has patient had a PCN reaction occurring within the last 10 years: No If all of the above answers are "NO", then may proceed with Cephalosporin use.    VITALS:  Blood pressure (!) 108/55, pulse 87, temperature 98.2 F (36.8 C), temperature source Oral, resp. rate (!) 21, height 6' (1.829 m), weight 71.2 kg (157 lb), SpO2 96 %.  PHYSICAL  EXAMINATION:  GENERAL:  84 y.o.-year-old patient lying in the bed with no acute distress.  EYES: Pupils equal, round, reactive to light and accommodation. No scleral icterus. Extraocular muscles intact.  HEENT: Head atraumatic, normocephalic. Oropharynx and nasopharynx clear.  NECK:  Supple, no jugular venous distention. No thyroid enlargement, no tenderness.  LUNGS: Normal breath sounds bilaterally, no wheezing, have crepitation. No use of accessory muscles of respiration.  CARDIOVASCULAR: S1, S2 normal. No murmurs, rubs, or gallops.  ABDOMEN: Soft, nontender, nondistended. Bowel sounds present. No organomegaly or mass.  EXTREMITIES: No pedal edema, cyanosis, or clubbing.  NEUROLOGIC: Cranial nerves II through XII are intact. Muscle strength 4/5 in all extremities. Sensation intact. Gait not checked.  PSYCHIATRIC: The patient is alert and oriented x 3.  SKIN: No obvious rash, lesion, or ulcer.   Physical Exam LABORATORY PANEL:   CBC Recent Labs  Lab 08/17/17 0438  WBC 9.7  HGB 9.0*  HCT 26.9*  PLT 84*   ------------------------------------------------------------------------------------------------------------------  Chemistries  Recent Labs  Lab 08/13/17 1358  08/17/17 0438  NA 136   < > 135  K 4.8   < > 3.4*  CL 103   < > 104  CO2 26   < > 24  GLUCOSE 120*   < > 107*  BUN 20   < > 31*  CREATININE 1.00   < > 0.98  CALCIUM 9.3   < > 8.2*  MG  --   --  2.1  AST 29  --   --   ALT 12*  --   --   ALKPHOS 98  --   --   BILITOT 1.0  --   --    < > =  values in this interval not displayed.   ------------------------------------------------------------------------------------------------------------------  Cardiac Enzymes Recent Labs  Lab 08/15/17 1746 08/16/17 0513  TROPONINI 24.15* 20.26*   ------------------------------------------------------------------------------------------------------------------  RADIOLOGY:  Dg Chest Port 1 View  Result Date:  08/16/2017 CLINICAL DATA:  Acute respiratory failure EXAM: PORTABLE CHEST 1 VIEW COMPARISON:  08/14/2017 FINDINGS: Shallow inspiration. Cardiac enlargement. Developing pulmonary vascular congestion and perihilar edema since previous study. Asymmetric increased opacity on the right may indicate superimposed pneumonia or asymmetrical edema. No pneumothorax. No pleural effusions. Calcification of the aorta. IMPRESSION: Cardiac enlargement with developing pulmonary vascular congestion and perihilar edema since previous study. Increased opacity on the right lung may indicate superimposed pneumonia or asymmetrical edema. Electronically Signed   By: Lucienne Capers M.D.   On: 08/16/2017 03:17    ASSESSMENT AND PLAN:   Active Problems:   Pneumonia   NSTEMI (non-ST elevated myocardial infarction) ([place])   Acute combined systolic and diastolic heart failure (HCC)   Severe aortic stenosis   1.  sepsis,Bilateral pneumonia     Abx, IV fluids, hypotensive, so transferred to stepdown.    BP stable now. Improved, move back to medical floor 08/17/17. 2.  Non-ST elevation MI   heparin drip, cardiology consulted.   Cath done- Have 3 vessel severe disease- and severe Aortic stenosis.   Dr. Rockey Situ spoke to family- plan is for conservative medical management. 3.  Acute renal failure likely prerenal secondary to dehydration, will start IV fluids and monitor kidney function daily. Better now, 4.  Diabetes type 2 stable continue low-carb diet. 5.  Hypertension history- but hypotensive here with sepsis, hold meds. 6.  MDS stable hemoglobin level 8.6, continue to monitor.   He follows at [facility] and this is some injections every 3 weeks as per family.   Blood transfusion to keep Hb > 9  Improving now, Cardio planning to stop heparin drip today and move to floor. Due to generalized weakness, need PT eval and likely rehab on d/c.  All the records are reviewed and case discussed with Care Management/Social  Workerr. Management plans discussed with the patient, family and they are in agreement.  CODE STATUS: Full.  TOTAL TIME TAKING CARE OF THIS PATIENT: 45   minutes.   POSSIBLE D/C IN 2-3 DAYS, DEPENDING ON CLINICAL CONDITION.   Vaughan Basta M.D on 08/17/2017   Between 7am to 6pm - Pager - [phone]  After 6pm go to [email or web address] - password [email or web address] [facility]  Office  [phone]  CC: Primary care physician; Physicians, [facility]  Note: This dictation was prepared with Dragon dictation along with smaller phrase technology. Any transcriptional errors that result from this process are unintentional.

## 2017-08-17 NOTE — Progress Notes (Signed)
.   Progress Note  Patient Name: Terry Gonzales Date of Encounter: 08/17/2017  Primary Cardiologist: No primary care provider on file.   Subjective   Continues to have shortness of breath and cough.  No chest pain but he has abdominal pain with cough.   Inpatient Medications    Scheduled Meds: . aspirin EC  81 mg Oral Daily  . budesonide (PULMICORT) nebulizer solution  0.5 mg Nebulization BID  . Chlorhexidine Gluconate Cloth  6 each Topical Q0600  . docusate sodium  100 mg Oral BID  . ipratropium-albuterol  3 mL Nebulization Q4H  . mouth rinse  15 mL Mouth Rinse BID  . multivitamin with minerals  1 tablet Oral Daily  . mupirocin ointment  1 application Nasal BID  . naproxen  250 mg Oral Daily  . omega-3 acid ethyl esters  1,000 mg Oral Daily  . phosphorus  500 mg Oral Once  . predniSONE  20 mg Oral Q breakfast  . simvastatin  10 mg Oral Daily   Continuous Infusions: . sodium chloride    . heparin 1,400 Units/hr (08/17/17 0700)  . levofloxacin (LEVAQUIN) IV Stopped (08/17/17 1132)   PRN Meds: acetaminophen **OR** acetaminophen, bisacodyl, guaiFENesin, HYDROcodone-acetaminophen, ondansetron **OR** ondansetron (ZOFRAN) IV, traZODone   Vital Signs    Vitals:   08/17/17 0600 08/17/17 0700 08/17/17 0800 08/17/17 0900  BP: (!) 110/58 (!) 107/54 110/70 (!) 110/55  Pulse: 71 64 74 97  Resp: 18 15 (!) 21 17  Temp:   98 F (36.7 C)   TempSrc:   Axillary   SpO2: 95% 95% 99% 96%  Weight:      Height:        Intake/Output Summary (Last 24 hours) at 08/17/2017 1204 Last data filed at 08/17/2017 0920 Gross per 24 hour  Intake 557.31 ml  Output 905 ml  Net -347.69 ml   Filed Weights   08/15/17 0412 08/16/17 0249 08/16/17 1002  Weight: 154 lb 6.4 oz (70 kg) 157 lb 13.6 oz (71.6 kg) 157 lb (71.2 kg)    Telemetry    Sinus rhythm.  3 beat run of NSVT - Personally Reviewed  ECG    n/a - Personally Reviewed  Physical Exam   VS:  BP 122/62   Pulse 90   Temp 98 F (36.7  C) (Axillary)   Resp 18   Ht 6' (1.829 m)   Wt 157 lb (71.2 kg)   SpO2 96%   BMI 21.29 kg/m  , BMI Body mass index is 21.29 kg/m. GENERAL:  Well appearing elderly man in no acute distress.  Coughing regularly.  HEENT: Pupils equal round and reactive, fundi not visualized, oral mucosa unremarkable NECK:  No jugular venous distention, waveform within normal limits, carotid upstroke brisk and symmetric, no bruits, no thyromegaly LUNGS:  Diffuse rhonchi.  No crackles or wheezes HEART:  RRR.  PMI not displaced or sustained,S1 and S2 within normal limits, no S3, no S4, no clicks, no rubs, II/VI systolic murmur at LUSB ABD:  Flat, positive bowel sounds normal in frequency in pitch, no bruits, no rebound, no guarding, no midline pulsatile mass, no hepatomegaly, no splenomegaly EXT:  2 plus pulses throughout, no edema, no cyanosis no clubbing SKIN:  No rashes no nodules NEURO:  Cranial nerves II through XII grossly intact, motor grossly intact throughout [facility]:  Cognitively intact, oriented to person place and time  Labs    Chemistry Recent Labs  Lab 08/13/17 1358  08/15/17 0530 08/15/17 1746 08/16/17  7858 08/17/17 0438  NA 136   < > 133*  --  134* 135  K 4.8   < > 3.9  --  3.8 3.4*  CL 103   < > 103  --  103 104  CO2 26   < > 21*  --  21* 24  GLUCOSE 120*   < > 130*  --  137* 107*  BUN 20   < > 31*  --  36* 31*  CREATININE 1.00   < > 1.33* 1.38* 1.10 0.98  CALCIUM 9.3   < > 8.5*  --  8.4* 8.2*  PROT 7.3  --   --   --   --   --   ALBUMIN 3.8  --   --   --   --   --   AST 29  --   --   --   --   --   ALT 12*  --   --   --   --   --   ALKPHOS 98  --   --   --   --   --   BILITOT 1.0  --   --   --   --   --   GFRNONAA >60   < > 45* 43* 57* >60  GFRAA >60   < > 53* 50* >60 >60  ANIONGAP 7   < > 9  --  10 7   < > = values in this interval not displayed.     Hematology Recent Labs  Lab 08/15/17 0530 08/16/17 0513 08/17/17 0438  WBC 9.0 16.4* 9.7  RBC 2.39* 2.63* 2.53*  HGB  8.6* 8.9* 9.0*  HCT 26.3* 27.8* 26.9*  MCV 110.1* 105.9* 106.2*  MCH 35.8* 34.0 35.6*  MCHC 32.5 32.1 33.5  RDW 17.4* 20.6* 20.0*  PLT 86* 76* 84*    Cardiac Enzymes Recent Labs  Lab 08/15/17 0530 08/15/17 1146 08/15/17 1746 08/16/17 0513  TROPONINI 9.24* 25.82* 24.15* 20.26*   No results for input(s): TROPIPOC in the last 168 hours.   BNPNo results for input(s): BNP, PROBNP in the last 168 hours.   DDimer No results for input(s): DDIMER in the last 168 hours.   Radiology    Dg Chest Port 1 View  Result Date: 08/16/2017 CLINICAL DATA:  Acute respiratory failure EXAM: PORTABLE CHEST 1 VIEW COMPARISON:  08/14/2017 FINDINGS: Shallow inspiration. Cardiac enlargement. Developing pulmonary vascular congestion and perihilar edema since previous study. Asymmetric increased opacity on the right may indicate superimposed pneumonia or asymmetrical edema. No pneumothorax. No pleural effusions. Calcification of the aorta. IMPRESSION: Cardiac enlargement with developing pulmonary vascular congestion and perihilar edema since previous study. Increased opacity on the right lung may indicate superimposed pneumonia or asymmetrical edema. Electronically Signed   By: Lucienne Capers M.D.   On: 08/16/2017 03:17    Cardiac Studies   Echo 08/15/17: Study Conclusions  - Left ventricle: The cavity size was mildly dilated. Systolic   function was moderately to severely reduced. The estimated   ejection fraction was in the range of 30% to 35%. Hypokinesis of   the anteroseptal myocardium. Hypokinesis of the anterior   myocardium. Hypokinesis of the apical myocardium. Doppler   parameters are consistent with abnormal left ventricular   relaxation (grade 1 diastolic dysfunction). - Aortic valve: There was moderate stenosis. Stenosis may be   underestimated secondary to low EF. Valve area (VTI): 1.12 cm^2. - Left atrium: The atrium was normal in size. -  Right ventricle: Systolic function was normal. -  Pulmonary arteries: Systolic pressure could not be accurately   estimated. - Inferior vena cava: The vessel was dilated. The respirophasic   diameter changes were blunted (< 50%), consistent with elevated   central venous pressure.  LHC 08/16/17:  Ost 2nd Mrg to 2nd Mrg lesion is 80% stenosed.  Mid Cx lesion is 75% stenosed.  Ost Cx lesion is 75% stenosed.  Prox LAD lesion is 80% stenosed.  Ost LAD lesion is 75% stenosed.  Ost RCA lesion is 99% stenosed.  Mid RCA lesion is 95% stenosed.  Dist RCA lesion is 99% stenosed.  Patient Profile     84 y.o. male with MDS and otherwise in excellent health and condition admitted 08/2017 with CAP, sepsis and hypoxic respiratory failure.  He was subsequently found to have NSTEMI and 3 vessel CAD on cath, acute systolic and diastolic heart failure, and moderate to severe aortic stenosis.  Assessment & Plan    # NSTEMI: # 3 vessel CAD: Troponin peaked at 68.  Planning for medical management. He is not a candidate for CABG given his acute illness and age.  We will stop heparin today, as it has been >48 hours and he is chest pain free.  Continue aspirin.  Would add clopidogrel 75mg  daily if OK per hematology.  Continue simvastatin and check lipids in AM.  Will add low dose metoprolol.  # Acute systolic and diastolic heart failure: Likely ischemic.  3v CAD on cath.  He appears euvolemic on exam.  Adding metoprolol as above.  Will add ACE-I/ARB/Entresto as BP tolerates.    # Moderate to severe aortic stenosis:  Likely severe aortic stenosis.  Unable to cross valve on LHC.  LVEF is reduced, raising the likelihood of low flow-low gradient aortic stenosis.  It would be very reasonable to involve palliative care.    # Pneumonia: # Sepsis: # Hypoxic respiratory failure:  # MDS:  Followed at [facility] for anemia and thrombocytopenia.    For questions or updates, please contact [place] Please consult [email or web address] for contact info under  Cardiology/STEMI.      Signed, Skeet Latch, MD  08/17/2017, 12:04 PM

## 2017-08-18 DIAGNOSIS — E7800 Pure hypercholesterolemia, unspecified: Secondary | ICD-10-CM

## 2017-08-18 LAB — CBC
HEMATOCRIT: 27.1 % — AB (ref 40.0–52.0)
HEMOGLOBIN: 8.9 g/dL — AB (ref 13.0–18.0)
MCH: 35 pg — AB (ref 26.0–34.0)
MCHC: 33 g/dL (ref 32.0–36.0)
MCV: 106 fL — AB (ref 80.0–100.0)
Platelets: 88 10*3/uL — ABNORMAL LOW (ref 150–440)
RBC: 2.56 MIL/uL — AB (ref 4.40–5.90)
RDW: 19.5 % — ABNORMAL HIGH (ref 11.5–14.5)
WBC: 10.4 10*3/uL (ref 3.8–10.6)

## 2017-08-18 LAB — BASIC METABOLIC PANEL
Anion gap: 7 (ref 5–15)
BUN: 31 mg/dL — AB (ref 6–20)
CO2: 25 mmol/L (ref 22–32)
CREATININE: 0.98 mg/dL (ref 0.61–1.24)
Calcium: 8.6 mg/dL — ABNORMAL LOW (ref 8.9–10.3)
Chloride: 107 mmol/L (ref 101–111)
GFR calc Af Amer: >60 mL/min (ref >60)
GFR calc non Af Amer: >60 mL/min (ref >60)
GLUCOSE: 139 mg/dL — AB (ref 65–99)
Potassium: 4.1 mmol/L (ref 3.5–5.1)
Sodium: 139 mmol/L (ref 135–145)

## 2017-08-18 LAB — LIPID PANEL
CHOL/HDL RATIO: 4.7 ratio
CHOLESTEROL: 85 mg/dL (ref 0–200)
HDL: 18 mg/dL — AB (ref >40)
LDL Cholesterol: 51 mg/dL (ref 0–99)
TRIGLYCERIDES: 79 mg/dL (ref <150)
VLDL: 16 mg/dL (ref 0–40)

## 2017-08-18 LAB — PROCALCITONIN: Procalcitonin: 1.78 ng/mL

## 2017-08-18 LAB — GLUCOSE, CAPILLARY: Glucose-Capillary: 111 mg/dL — ABNORMAL HIGH (ref 65–99)

## 2017-08-18 MED ORDER — LOSARTAN POTASSIUM 25 MG PO TABS
12.5000 mg | ORAL_TABLET | Freq: Every day | ORAL | Status: DC
  Administered 2017-08-18 – 2017-08-19 (×2): 12.5 mg via ORAL
  Filled 2017-08-18 (×2): qty 1

## 2017-08-18 MED ORDER — IPRATROPIUM-ALBUTEROL 0.5-2.5 (3) MG/3ML IN SOLN
3.0000 mL | Freq: Four times a day (QID) | RESPIRATORY_TRACT | Status: DC
  Administered 2017-08-18 – 2017-08-19 (×4): 3 mL via RESPIRATORY_TRACT
  Filled 2017-08-18 (×5): qty 3

## 2017-08-18 MED ORDER — TRAZODONE HCL 50 MG PO TABS
50.0000 mg | ORAL_TABLET | Freq: Every evening | ORAL | Status: DC | PRN
  Administered 2017-08-18: 50 mg via ORAL

## 2017-08-18 MED ORDER — GUAIFENESIN 100 MG/5ML PO SOLN
10.0000 mL | ORAL | Status: DC | PRN
  Administered 2017-08-18: 200 mg via ORAL
  Filled 2017-08-18 (×2): qty 10

## 2017-08-18 NOTE — Progress Notes (Signed)
Patient is alert and oriented.  Able to make needs known. Taking PO meds as prescribed without difficulty. Continues to have cough.  No distress noted.  Report given to oncoming nurse.

## 2017-08-18 NOTE — Progress Notes (Signed)
Pt being transferred to room 241. Report called to [employer], Therapist, sports. Pt and belongings transferred to room 241 without incident.

## 2017-08-18 NOTE — Progress Notes (Signed)
[place] [facility] [place] NAME: Terry Gonzales    MR#:  [id number]  DATE OF BIRTH:  08-15-1924  SUBJECTIVE:  CHIEF COMPLAINT:   Chief Complaint  Patient presents with  . Pneumonia     Came with worsening respiratory status with cough and chest pain. Found to have pneumonia and sepsis. Running hypotensive. Also have troponin trending upwards.  Status post cardiac catheterization, no much pain but have generalized weakness complaints today. Hemoglobin, blood pressure, oxygenation are all stable.   REVIEW OF SYSTEMS:  CONSTITUTIONAL: No fever, fatigue or weakness.  EYES: No blurred or double vision.  EARS, NOSE, AND THROAT: No tinnitus or ear pain.  RESPIRATORY: Positive for cough, shortness of breath, no wheezing or hemoptysis.  CARDIOVASCULAR: Have chest pain, no orthopnea, edema.  GASTROINTESTINAL: No nausea, vomiting, diarrhea or abdominal pain.  GENITOURINARY: No dysuria, hematuria.  ENDOCRINE: No polyuria, nocturia,  HEMATOLOGY: No anemia, easy bruising or bleeding SKIN: No rash or lesion. MUSCULOSKELETAL: No joint pain or arthritis.   NEUROLOGIC: No tingling, numbness, weakness.  PSYCHIATRY: No anxiety or depression.   ROS  DRUG ALLERGIES:   Allergies  Allergen Reactions  . Penicillins Swelling    Has patient had a PCN reaction causing immediate rash, facial/tongue/throat swelling, SOB or lightheadedness with hypotension: Yes Has patient had a PCN reaction causing severe rash involving mucus membranes or skin necrosis: No Has patient had a PCN reaction that required hospitalization: No Has patient had a PCN reaction occurring within the last 10 years: No If all of the above answers are "NO", then may proceed with Cephalosporin use.    VITALS:  Blood pressure 124/67, pulse 86, temperature (!) 97.5 F (36.4 C), temperature source Axillary, resp. rate 19, height 6' (1.829 m), weight 73.5 kg (162 lb 0.6 oz), SpO2 96 %.  PHYSICAL  EXAMINATION:  GENERAL:  84 y.o.-year-old patient lying in the bed with no acute distress.  EYES: Pupils equal, round, reactive to light and accommodation. No scleral icterus. Extraocular muscles intact.  HEENT: Head atraumatic, normocephalic. Oropharynx and nasopharynx clear.  NECK:  Supple, no jugular venous distention. No thyroid enlargement, no tenderness.  LUNGS: Normal breath sounds bilaterally, no wheezing, have crepitation. No use of accessory muscles of respiration.  CARDIOVASCULAR: S1, S2 normal. No murmurs, rubs, or gallops.  ABDOMEN: Soft, nontender, nondistended. Bowel sounds present. No organomegaly or mass.  EXTREMITIES: No pedal edema, cyanosis, or clubbing.  NEUROLOGIC: Cranial nerves II through XII are intact. Muscle strength 4/5 in all extremities. Sensation intact. Gait not checked.  PSYCHIATRIC: The patient is alert and oriented x 3.  SKIN: No obvious rash, lesion, or ulcer.   Physical Exam LABORATORY PANEL:   CBC Recent Labs  Lab 08/18/17 0416  WBC 10.4  HGB 8.9*  HCT 27.1*  PLT 88*   ------------------------------------------------------------------------------------------------------------------  Chemistries  Recent Labs  Lab 08/13/17 1358  08/17/17 0438 08/18/17 0416  NA 136   < > 135 139  K 4.8   < > 3.4* 4.1  CL 103   < > 104 107  CO2 26   < > 24 25  GLUCOSE 120*   < > 107* 139*  BUN 20   < > 31* 31*  CREATININE 1.00   < > 0.98 0.98  CALCIUM 9.3   < > 8.2* 8.6*  MG  --   --  2.1  --   AST 29  --   --   --   ALT 12*  --   --   --  ALKPHOS 98  --   --   --   BILITOT 1.0  --   --   --    < > = values in this interval not displayed.   ------------------------------------------------------------------------------------------------------------------  Cardiac Enzymes Recent Labs  Lab 08/15/17 1746 08/16/17 0513  TROPONINI 24.15* 20.26*    ------------------------------------------------------------------------------------------------------------------  RADIOLOGY:  No results found.  ASSESSMENT AND PLAN:   Active Problems:   Pneumonia   NSTEMI (non-ST elevated myocardial infarction) ([place])   Acute combined systolic and diastolic heart failure (HCC)   Severe aortic stenosis   1.  sepsis,Bilateral pneumonia     Abx, IV fluids, hypotensive, so transferred to stepdown.    BP stable now. Improved, move back to medical floor 08/17/17. No beds available. 2.  Non-ST elevation MI   heparin drip, cardiology consulted.   Cath done- Have 3 vessel severe disease- and severe Aortic stenosis.   Dr. Rockey Situ spoke to family- plan is for conservative medical management.   On ASA, metoprolol, Statin.    Cardio also suggesting plavix, if OK by hematology.   Stopped heparine drip after 48 hrs. 3.  Acute renal failure likely prerenal secondary to dehydration, will start IV fluids and monitor kidney function daily. Better now, 4.  Diabetes type 2 stable continue low-carb diet. 5.  Hypertension history- but hypotensive here with sepsis, hold meds.   Now stable on low dose metoprolol. 6.  MDS stable hemoglobin level 8.6, continue to monitor.   He follows at [facility] and this is some injections every 3 weeks as per family.   Blood transfusion to keep Hb > 9   Called heme consult to discuss starting plavix.  Improving now, Cardio planning to stop heparin drip today and move to floor. Due to generalized weakness, need PT eval and likely rehab on d/c.  All the records are reviewed and case discussed with Care Management/Social Workerr. Management plans discussed with the patient, family and they are in agreement.  CODE STATUS: Full.  TOTAL TIME TAKING CARE OF THIS PATIENT: 45   minutes.   POSSIBLE D/C IN 2-3 DAYS, DEPENDING ON CLINICAL CONDITION.   Vaughan Basta M.D on 08/18/2017   Between 7am to 6pm - Pager -  [phone]  After 6pm go to [email or web address] - password [email or web address] [facility]  Office  [phone]  CC: Primary care physician; Physicians, [facility]  Note: This dictation was prepared with Dragon dictation along with smaller phrase technology. Any transcriptional errors that result from this process are unintentional.

## 2017-08-18 NOTE — Progress Notes (Signed)
Pt is alert and oriented, no complaints of pain.  NSR/SB on telemetry.  Lungs clear, requiring 2L [place] to maintain O2 sats >92%.  Vital signs stable, afebrile.  Adequate urine output from condom catheter.  See I&O's for output.  Pt is awaiting transfer to 2A Telemetry unit once bed is available.

## 2017-08-18 NOTE — Progress Notes (Signed)
.   Progress Note  Patient Name: Terry Gonzales Date of Encounter: 08/18/2017  Primary Cardiologist: No primary care provider on file.   Subjective   Feeling much better today.  He has chest pain only with cough.   Inpatient Medications    Scheduled Meds: . aspirin EC  81 mg Oral Daily  . budesonide (PULMICORT) nebulizer solution  0.5 mg Nebulization BID  . Chlorhexidine Gluconate Cloth  6 each Topical Q0600  . docusate sodium  100 mg Oral BID  . ipratropium-albuterol  3 mL Nebulization Q6H  . mouth rinse  15 mL Mouth Rinse BID  . metoprolol succinate  12.5 mg Oral Daily  . multivitamin with minerals  1 tablet Oral Daily  . mupirocin ointment  1 application Nasal BID  . naproxen  250 mg Oral Daily  . omega-3 acid ethyl esters  1,000 mg Oral Daily  . predniSONE  20 mg Oral Q breakfast  . simvastatin  10 mg Oral Daily   Continuous Infusions: . sodium chloride    . levofloxacin (LEVAQUIN) IV 500 mg (08/18/17 1051)   PRN Meds: acetaminophen **OR** acetaminophen, bisacodyl, guaiFENesin, HYDROcodone-acetaminophen, ondansetron **OR** ondansetron (ZOFRAN) IV, traZODone   Vital Signs    Vitals:   08/18/17 0456 08/18/17 0500 08/18/17 0600 08/18/17 0800  BP:  (!) 109/54 103/60 124/67  Pulse:  (!) 54 (!) 58 86  Resp:  16 15 19   Temp:    (!) 97.5 F (36.4 C)  TempSrc:    Axillary  SpO2:  95% 92% 96%  Weight: 162 lb 0.6 oz (73.5 kg)     Height:        Intake/Output Summary (Last 24 hours) at 08/18/2017 1127 Last data filed at 08/18/2017 4098 Gross per 24 hour  Intake 660 ml  Output 1650 ml  Net -990 ml   Filed Weights   08/16/17 0249 08/16/17 1002 08/18/17 0456  Weight: 157 lb 13.6 oz (71.6 kg) 157 lb (71.2 kg) 162 lb 0.6 oz (73.5 kg)    Telemetry    Sinus rhythm.  No events.   - Personally Reviewed  ECG    n/a - Personally Reviewed  Physical Exam   VS:  BP 124/67 (BP Location: Right Arm)   Pulse 86   Temp (!) 97.5 F (36.4 C) (Axillary)   Resp 19   Ht 6'  (1.829 m)   Wt 162 lb 0.6 oz (73.5 kg)   SpO2 96%   BMI 21.98 kg/m  , BMI Body mass index is 21.98 kg/m. GENERAL:  Well appearing elderly man in no acute distress.  HEENT: Pupils equal round and reactive, fundi not visualized, oral mucosa unremarkable NECK:  No jugular venous distention, waveform within normal limits, carotid upstroke brisk and symmetric, no bruits, no thyromegaly LUNGS:  Diffuse rhonchi.  No crackles or wheezes.  Slightly improved from prior. HEART:  RRR.  PMI not displaced or sustained,S1 and S2 within normal limits, no S3, no S4, no clicks, no rubs, II/VI systolic murmur at LUSB ABD:  Flat, positive bowel sounds normal in frequency in pitch, no bruits, no rebound, no guarding, no midline pulsatile mass, no hepatomegaly, no splenomegaly EXT:  2 plus pulses throughout, no edema, no cyanosis no clubbing SKIN:  No rashes no nodules NEURO:  Cranial nerves II through XII grossly intact, motor grossly intact throughout [facility]:  Cognitively intact, oriented to person place and time  Labs    Chemistry Recent Labs  Lab 08/13/17 1358  08/16/17 1191 08/17/17 4782  08/18/17 0416  NA 136   < > 134* 135 139  K 4.8   < > 3.8 3.4* 4.1  CL 103   < > 103 104 107  CO2 26   < > 21* 24 25  GLUCOSE 120*   < > 137* 107* 139*  BUN 20   < > 36* 31* 31*  CREATININE 1.00   < > 1.10 0.98 0.98  CALCIUM 9.3   < > 8.4* 8.2* 8.6*  PROT 7.3  --   --   --   --   ALBUMIN 3.8  --   --   --   --   AST 29  --   --   --   --   ALT 12*  --   --   --   --   ALKPHOS 98  --   --   --   --   BILITOT 1.0  --   --   --   --   GFRNONAA >60   < > 57* >60 >60  GFRAA >60   < > >60 >60 >60  ANIONGAP 7   < > 10 7 7    < > = values in this interval not displayed.     Hematology Recent Labs  Lab 08/16/17 0513 08/17/17 0438 08/18/17 0416  WBC 16.4* 9.7 10.4  RBC 2.63* 2.53* 2.56*  HGB 8.9* 9.0* 8.9*  HCT 27.8* 26.9* 27.1*  MCV 105.9* 106.2* 106.0*  MCH 34.0 35.6* 35.0*  MCHC 32.1 33.5 33.0  RDW  20.6* 20.0* 19.5*  PLT 76* 84* 88*    Cardiac Enzymes Recent Labs  Lab 08/15/17 0530 08/15/17 1146 08/15/17 1746 08/16/17 0513  TROPONINI 9.24* 25.82* 24.15* 20.26*   No results for input(s): TROPIPOC in the last 168 hours.   BNPNo results for input(s): BNP, PROBNP in the last 168 hours.   DDimer No results for input(s): DDIMER in the last 168 hours.   Radiology    No results found.  Cardiac Studies   Echo 08/15/17: Study Conclusions  - Left ventricle: The cavity size was mildly dilated. Systolic   function was moderately to severely reduced. The estimated   ejection fraction was in the range of 30% to 35%. Hypokinesis of   the anteroseptal myocardium. Hypokinesis of the anterior   myocardium. Hypokinesis of the apical myocardium. Doppler   parameters are consistent with abnormal left ventricular   relaxation (grade 1 diastolic dysfunction). - Aortic valve: There was moderate stenosis. Stenosis may be   underestimated secondary to low EF. Valve area (VTI): 1.12 cm^2. - Left atrium: The atrium was normal in size. - Right ventricle: Systolic function was normal. - Pulmonary arteries: Systolic pressure could not be accurately   estimated. - Inferior vena cava: The vessel was dilated. The respirophasic   diameter changes were blunted (< 50%), consistent with elevated   central venous pressure.  LHC 08/16/17:  Ost 2nd Mrg to 2nd Mrg lesion is 80% stenosed.  Mid Cx lesion is 75% stenosed.  Ost Cx lesion is 75% stenosed.  Prox LAD lesion is 80% stenosed.  Ost LAD lesion is 75% stenosed.  Ost RCA lesion is 99% stenosed.  Mid RCA lesion is 95% stenosed.  Dist RCA lesion is 99% stenosed.  Patient Profile     85 y.o. male with MDS and otherwise in excellent health and condition admitted 08/2017 with CAP, sepsis and hypoxic respiratory failure.  He was subsequently found to have NSTEMI and 3 vessel  CAD on cath, acute systolic and diastolic heart failure, and moderate  to severe aortic stenosis.  Assessment & Plan    # NSTEMI: # 3 vessel CAD: Troponin peaked at 7.  Planning for medical management. He is not a candidate for CABG given his acute illness and age.  Heparin was stopped 1/5 and he is feeling well. Continue aspirin and metoprolol.  Would add clopidogrel 75mg  daily if OK per hematology.  Continue simvastatin.  LDLD 51 this admission.   # Acute systolic and diastolic heart failure: Likely ischemic.  3v CAD on cath.  He appears euvolemic on exam.  Metoprolol was added 1/5.  Will start losartan 12.5mg  daily today.    # Moderate to severe aortic stenosis:  Likely severe aortic stenosis.  Unable to cross valve on LHC.  LVEF is reduced, raising the likelihood of low flow-low gradient aortic stenosis.  It would be very reasonable to involve palliative care.    # Pneumonia: # Sepsis: # Hypoxic respiratory failure: Improving.  Respiratory status much better today.  Per IM.  # MDS:  Followed at [facility] for anemia and thrombocytopenia.    For questions or updates, please contact [place] Please consult [email or web address] for contact info under Cardiology/STEMI.      Signed, Skeet Latch, MD  08/18/2017, 11:27 AM

## 2017-08-19 ENCOUNTER — Telehealth: Payer: Self-pay | Admitting: Cardiovascular Disease

## 2017-08-19 DIAGNOSIS — D469 Myelodysplastic syndrome, unspecified: Secondary | ICD-10-CM

## 2017-08-19 DIAGNOSIS — Z7982 Long term (current) use of aspirin: Secondary | ICD-10-CM

## 2017-08-19 DIAGNOSIS — E119 Type 2 diabetes mellitus without complications: Secondary | ICD-10-CM

## 2017-08-19 DIAGNOSIS — Z79899 Other long term (current) drug therapy: Secondary | ICD-10-CM

## 2017-08-19 DIAGNOSIS — Z87891 Personal history of nicotine dependence: Secondary | ICD-10-CM

## 2017-08-19 DIAGNOSIS — I10 Essential (primary) hypertension: Secondary | ICD-10-CM

## 2017-08-19 DIAGNOSIS — D649 Anemia, unspecified: Secondary | ICD-10-CM

## 2017-08-19 LAB — CULTURE, BLOOD (ROUTINE X 2)
CULTURE: NO GROWTH
Culture: NO GROWTH
SPECIAL REQUESTS: ADEQUATE
Special Requests: ADEQUATE

## 2017-08-19 LAB — GLUCOSE, CAPILLARY
GLUCOSE-CAPILLARY: 167 mg/dL — AB (ref 65–99)
Glucose-Capillary: 98 mg/dL (ref 65–99)

## 2017-08-19 MED ORDER — LOSARTAN POTASSIUM 25 MG PO TABS: 12.5000 mg | ORAL_TABLET | Freq: Every day | ORAL | 0 refills | Status: DC

## 2017-08-19 MED ORDER — ALBUTEROL SULFATE HFA 108 (90 BASE) MCG/ACT IN AERS: 2.0000 | INHALATION_SPRAY | Freq: Four times a day (QID) | RESPIRATORY_TRACT | 2 refills | Status: AC | PRN

## 2017-08-19 MED ORDER — POLYETHYLENE GLYCOL 3350 17 G PO PACK
17.0000 g | PACK | Freq: Once | ORAL | Status: AC
  Administered 2017-08-19: 17 g via ORAL
  Filled 2017-08-19: qty 1

## 2017-08-19 MED ORDER — SODIUM CHLORIDE 0.9% FLUSH
3.0000 mL | Freq: Two times a day (BID) | INTRAVENOUS | Status: DC
  Administered 2017-08-19: 3 mL via INTRAVENOUS

## 2017-08-19 MED ORDER — ASPIRIN 81 MG PO TBEC: 81.0000 mg | DELAYED_RELEASE_TABLET | Freq: Every day | ORAL | 0 refills | Status: AC

## 2017-08-19 MED ORDER — SODIUM CHLORIDE 0.9% FLUSH: 3.0000 mL | Freq: Two times a day (BID) | INTRAVENOUS | Status: DC

## 2017-08-19 MED ORDER — CLOPIDOGREL BISULFATE 75 MG PO TABS
75.0000 mg | ORAL_TABLET | Freq: Every day | ORAL | Status: DC
  Administered 2017-08-19: 75 mg via ORAL
  Filled 2017-08-19: qty 1

## 2017-08-19 MED ORDER — LEVOFLOXACIN 500 MG PO TABS: 500.0000 mg | ORAL_TABLET | Freq: Every day | ORAL | 0 refills | Status: DC

## 2017-08-19 MED ORDER — BISACODYL 5 MG PO TBEC: 5.0000 mg | DELAYED_RELEASE_TABLET | Freq: Every day | ORAL | 0 refills | Status: DC | PRN

## 2017-08-19 MED ORDER — CLOPIDOGREL BISULFATE 75 MG PO TABS: 75.0000 mg | ORAL_TABLET | Freq: Every day | ORAL | 0 refills | Status: DC

## 2017-08-19 MED ORDER — METOPROLOL SUCCINATE ER 25 MG PO TB24: 12.5000 mg | ORAL_TABLET | Freq: Every day | ORAL | 0 refills | Status: DC

## 2017-08-19 NOTE — Care Management (Signed)
Patient is for discharge home today with home health.  Patient and wife in agreement.  Verbalize they has a walker at home.  No agency preference for home health.  [facility] is in network with patient's medicare [facility] group. Agency can see patient within 24 hours of discharge. Informed that patient has not qualified for home oxygen. Confirmed contact information.  No issues obtaining meds

## 2017-08-19 NOTE — Care Management Note (Signed)
Case Management Note  Patient Details  Name: Ervine Witucki MRN: [id number] Date of Birth: June 12, 1925  Subjective/Objective:                Transferred out of icu 1/6 pm.  PT consult is pending.  Currently on room air  Action/Plan: Await physical therapy recommendations  Expected Discharge Date:                  Expected Discharge Plan:     In-House Referral:     Discharge planning Services     Post Acute Care Choice:    Choice offered to:     DME Arranged:    DME Agency:     HH Arranged:    HH Agency:     Status of Service:     If discussed at [employer], dates discussed:    Additional Comments:  Katrina Stack, RN 08/19/2017, 10:20 AM

## 2017-08-19 NOTE — Care Management Important Message (Signed)
Important Message  Patient Details  Name: Terry Gonzales MRN: [id number] Date of Birth: Dec 02, 1924   Medicare Important Message Given:  Yes Signed IM notice given    Katrina Stack, RN 08/19/2017, 12:11 PM

## 2017-08-19 NOTE — Telephone Encounter (Signed)
TCM....  Patient is being discharged   They saw Dr Rockey Situ   They are scheduled to see Dr Lissa Hoard on 08/29/17  They were seen for   They need to be seen within 2 weeks   Pt is not wait list   Please call

## 2017-08-19 NOTE — Plan of Care (Signed)
Up walking with little difficulty.  Moving independently in room.  Education regarding discharge meds complete

## 2017-08-19 NOTE — Consult Note (Signed)
[facility] CONSULT NOTE  Patient Care Team: Physicians, [place] as PCP - General  CHIEF COMPLAINTS/PURPOSE OF CONSULTATION: MDS/low platelets need for antiplatelet therapy  HISTORY OF PRESENTING ILLNESS:  Terry Gonzales 84 y.o.  male history of MDS predominantly anemia currently admitted the hospital for non-STEM/possible pneumonia.  Patient also has severe aortic stenosis for which he is recommended conservative management.  Patient has been on heparin; plan is to start Plavix.  Hematology has been consulted with regards to his history of MDS/thrombocytopenia.  Patient noted to have MDS-possibly low-grade predominant anemia; moderate thrombocytopenia [platelets 80s-90s] approximately a year ago for which he follows up with Dr.Ma at [facility].  Patient is currently being treated with Darbopoetin every 3-4 weeks.  He does not need any blood transfusion so far.  Denies any gum bleeding or nosebleeds.   Patient goes around with a walker; denies any falls.  His appetite is fairly good.  ROS: A complete 10 point review of system is done which is negative except mentioned above in history of present illness  MEDICAL HISTORY:  Past Medical History:  Diagnosis Date  . Diabetes mellitus without complication ([place])   . Hypertension   . Myelodysplastic syndrome ([place])     SURGICAL HISTORY: Past Surgical History:  Procedure Laterality Date  . LEFT HEART CATH AND CORONARY ANGIOGRAPHY N/A 08/16/2017   Procedure: LEFT HEART CATH AND CORONARY ANGIOGRAPHY;  Surgeon: Minna Merritts, MD;  Location: [facility];  Service: Cardiovascular;  Laterality: N/A;    SOCIAL HISTORY: Social History   Socioeconomic History  . Marital status: Married    Spouse name: Not on file  . Number of children: Not on file  . Years of education: Not on file  . Highest education level: Not on file  Social Needs  . Financial resource strain: Not on file  . Food insecurity - worry: Not on file  .  Food insecurity - inability: Not on file  . Transportation needs - medical: Not on file  . Transportation needs - non-medical: Not on file  Occupational History  . Not on file  Tobacco Use  . Smoking status: Former Smoker    Packs/day: 1.00    Types: Cigarettes    Last attempt to quit: 08/13/1968    Years since quitting: 49.0  . Smokeless tobacco: Never Used  Substance and Sexual Activity  . Alcohol use: No    Frequency: Never  . Drug use: Not on file  . Sexual activity: Not on file  Other Topics Concern  . Not on file  Social History Narrative  . Not on file    FAMILY HISTORY: Family History  Problem Relation Age of Onset  . Hypertension Father     ALLERGIES:  is allergic to penicillins.  MEDICATIONS:  Current Facility-Administered Medications  Medication Dose Route Frequency Provider Last Rate Last Dose  . 0.9 %  sodium chloride infusion   Intravenous Once Vaughan Basta, MD      . acetaminophen (TYLENOL) tablet 650 mg  650 mg Oral Q6H PRN Lisa Roca, MD   650 mg at 08/17/17 0141   Or  . acetaminophen (TYLENOL) suppository 650 mg  650 mg Rectal Q6H PRN Lisa Roca, MD      . aspirin EC tablet 81 mg  81 mg Oral Daily Lisa Roca, MD   81 mg at 08/19/17 0857  . bisacodyl (DULCOLAX) EC tablet 5 mg  5 mg Oral Daily PRN Lisa Roca, MD      .  budesonide (PULMICORT) nebulizer solution 0.5 mg  0.5 mg Nebulization BID Flora Lipps, MD   0.5 mg at 08/19/17 0805  . Chlorhexidine Gluconate Cloth 2 % PADS 6 each  6 each Topical Q0600 Anette Riedel, RPH   6 each at 08/18/17 3762  . clopidogrel (PLAVIX) tablet 75 mg  75 mg Oral Daily Vaughan Basta, MD   75 mg at 08/19/17 1213  . docusate sodium (COLACE) capsule 100 mg  100 mg Oral BID Lisa Roca, MD   100 mg at 08/19/17 0857  . guaiFENesin (ROBITUSSIN) 100 MG/5ML solution 200 mg  10 mL Oral Q4H PRN Awilda Bill, NP   200 mg at 08/18/17 0032  . HYDROcodone-acetaminophen (NORCO/VICODIN) 5-325 MG per  tablet 1-2 tablet  1-2 tablet Oral Q4H PRN Lisa Roca, MD      . ipratropium-albuterol (DUONEB) 0.5-2.5 (3) MG/3ML nebulizer solution 3 mL  3 mL Nebulization Q6H Vaughan Basta, MD   3 mL at 08/19/17 1336  . levofloxacin (LEVAQUIN) IVPB 500 mg  500 mg Intravenous Q24H [facility]   Stopped at 08/19/17 1129  . losartan (COZAAR) tablet 12.5 mg  12.5 mg Oral Daily Skeet Latch, MD   12.5 mg at 08/19/17 0854  . MEDLINE mouth rinse  15 mL Mouth Rinse BID Flora Lipps, MD   15 mL at 08/19/17 0858  . metoprolol succinate (TOPROL-XL) 24 hr tablet 12.5 mg  12.5 mg Oral Daily Skeet Latch, MD   12.5 mg at 08/19/17 0856  . multivitamin with minerals tablet 1 tablet  1 tablet Oral Daily Lisa Roca, MD   1 tablet at 08/19/17 [phone]  . mupirocin ointment (BACTROBAN) 2 % 1 application  1 application Nasal BID Anette Riedel, RPH   1 application at 17/61/60 0859  . naproxen (NAPROSYN) tablet 250 mg  250 mg Oral Daily Lisa Roca, MD   250 mg at 08/19/17 7371  . omega-3 acid ethyl esters (LOVAZA) capsule 1,000 mg  1,000 mg Oral Daily Lisa Roca, MD   1,000 mg at 08/19/17 0856  . ondansetron (ZOFRAN) tablet 4 mg  4 mg Oral Q6H PRN Lisa Roca, MD       Or  . ondansetron [facility]) injection 4 mg  4 mg Intravenous Q6H PRN Lisa Roca, MD      . predniSONE (DELTASONE) tablet 20 mg  20 mg Oral Q breakfast Flora Lipps, MD   20 mg at 08/19/17 0852  . simvastatin (ZOCOR) tablet 10 mg  10 mg Oral Daily Lisa Roca, MD   10 mg at 08/19/17 0857  . sodium chloride flush (NS) 0.9 % injection 3 mL  3 mL Intravenous Q12H Vaughan Basta, MD   3 mL at 08/19/17 0901  . sodium chloride flush (NS) 0.9 % injection 3 mL  3 mL Intravenous Q12H Vaughan Basta, MD      . traZODone (DESYREL) tablet 50 mg  50 mg Oral QHS PRN Awilda Bill, NP   50 mg at 08/18/17 0032      .  PHYSICAL EXAMINATION:  Vitals:   08/19/17 0757 08/19/17 0805  BP: 120/64   Pulse: 63   Resp: 18    Temp: 97.7 F (36.5 C)   SpO2: 93% 93%   Filed Weights   08/16/17 0249 08/16/17 1002 08/18/17 0456  Weight: 157 lb 13.6 oz (71.6 kg) 157 lb (71.2 kg) 162 lb 0.6 oz (73.5 kg)    GENERAL: Frail appearing Caucasian male patient who looks younger than his age.  Alert, no distress and comfortable.   Accompanied by his wife. EYES: no pallor or icterus OROPHARYNX: no thrush or ulceration. NECK: supple, no masses felt LYMPH:  no palpable lymphadenopathy in the cervical, axillary or inguinal regions LUNGS: decreased breath sounds to auscultation at bases and  No wheeze or crackles HEART/CVS: regular rate & rhythm and no murmurs; No lower extremity edema ABDOMEN: abdomen soft, non-tender and normal bowel sounds Musculoskeletal:no cyanosis of digits and no clubbing  PSYCH: alert & oriented x 3 with fluent speech NEURO: no focal motor/sensory deficits SKIN:  no rashes or significant lesions  LABORATORY DATA:  I have reviewed the data as listed Lab Results  Component Value Date   WBC 10.4 08/18/2017   HGB 8.9 (L) 08/18/2017   HCT 27.1 (L) 08/18/2017   MCV 106.0 (H) 08/18/2017   PLT 88 (L) 08/18/2017   Recent Labs    08/13/17 1358  08/16/17 0513 08/17/17 0438 08/18/17 0416  NA 136   < > 134* 135 139  K 4.8   < > 3.8 3.4* 4.1  CL 103   < > 103 104 107  CO2 26   < > 21* 24 25  GLUCOSE 120*   < > 137* 107* 139*  BUN 20   < > 36* 31* 31*  CREATININE 1.00   < > 1.10 0.98 0.98  CALCIUM 9.3   < > 8.4* 8.2* 8.6*  GFRNONAA >60   < > 57* >60 >60  GFRAA >60   < > >60 >60 >60  PROT 7.3  --   --   --   --   ALBUMIN 3.8  --   --   --   --   AST 29  --   --   --   --   ALT 12*  --   --   --   --   ALKPHOS 98  --   --   --   --   BILITOT 1.0  --   --   --   --    < > = values in this interval not displayed.    RADIOGRAPHIC STUDIES: I have personally reviewed the radiological images as listed and agreed with the findings in the report. Dg Chest 2 View  Result Date: 08/14/2017 CLINICAL  DATA:  Shortness of breath and cough EXAM: CHEST  2 VIEW COMPARISON:  Chest radiograph and chest CT August 13, 2017 FINDINGS: There is stable patchy opacity in the lung bases, somewhat more on the right than on the left. No new opacity evident elsewhere. Heart is upper normal in size with pulmonary vascularity within normal limits. There is aortic atherosclerosis. There is an old healed fracture of the right clavicle. There is calcification in the left anterior descending and left circumflex coronary artery. IMPRESSION: Patchy bibasilar opacity, at least in part due to atelectasis but based on CT appearance, likely with associated pneumonia and/ or aspiration. Lungs elsewhere clear. Stable cardiac silhouette. There is aortic atherosclerosis. Foci of coronary artery calcification also noted. Aortic Atherosclerosis (ICD10-I70.0). Electronically Signed   By: Lowella Grip III M.D.   On: 08/14/2017 18:58   Dg Chest 2 View  Result Date: 08/13/2017 CLINICAL DATA:  84 year old male with a history of shortness of breath EXAM: CHEST  2 VIEW COMPARISON:  10/30/2014, 05/27/2014 FINDINGS: Cardiomediastinal silhouette unchanged in size and contour. No evidence of central vascular congestion. Compare to the prior there is increased interstitial opacities, particularly at the right lung base. No  pneumothorax. No confluent airspace disease. No pleural effusion. Coronary calcifications. IMPRESSION: Coarse interstitial markings may reflect either early edema, chronic scarring, or potentially atypical infection. No evidence of lobar pneumonia. Electronically Signed   By: Corrie Mckusick D.O.   On: 08/13/2017 14:42   Ct Angio Chest Pe W/cm &/or Wo Cm  Result Date: 08/13/2017 CLINICAL DATA:  Flu like symptoms for the past 3 days with nonproductive an week cough. Worsening shortness of breath overnight. Evaluate pulmonary embolism. EXAM: CT ANGIOGRAPHY CHEST WITH CONTRAST TECHNIQUE: Multidetector CT imaging of the chest was  performed using the standard protocol during bolus administration of intravenous contrast. Multiplanar CT image reconstructions and MIPs were obtained to evaluate the vascular anatomy. CONTRAST:  87mL ISOVUE-370 IOPAMIDOL (ISOVUE-370) INJECTION 76% COMPARISON:  Chest x-ray from same day. FINDINGS: Cardiovascular: Satisfactory opacification of the pulmonary arteries to the segmental level. No evidence of pulmonary embolism. Normal heart size. No pericardial effusion. Normal caliber thoracic aorta. Coronary, aortic arch, and branch vessel atherosclerotic vascular disease. Mediastinum/Nodes: Multiple subcentimeter mediastinal lymph nodes are likely reactive. No enlarged hilar, or axillary lymph nodes. Mildly patulous esophagus. Thyroid gland and trachea demonstrate no significant findings. Lungs/Pleura: Patchy increased peribronchovascular nodularity and interlobular septal thickening in the right greater than left lower lobes. No focal consolidation, pleural effusion, or pneumothorax. Upper Abdomen: No acute abnormality. Musculoskeletal: No chest wall abnormality. No acute or significant osseous findings. Degenerative changes of the thoracic spine. Review of the MIP images confirms the above findings. IMPRESSION: 1. No evidence of pulmonary embolism. 2. Patchy increased peribronchovascular nodularity in the right greater than left lower lobes, likely related to atypical infection or aspiration, especially in the setting of a somewhat patulous esophagus. 3.  Aortic atherosclerosis (ICD10-I70.0). Electronically Signed   By: Titus Dubin M.D.   On: 08/13/2017 16:36   Dg Chest Port 1 View  Result Date: 08/16/2017 CLINICAL DATA:  Acute respiratory failure EXAM: PORTABLE CHEST 1 VIEW COMPARISON:  08/14/2017 FINDINGS: Shallow inspiration. Cardiac enlargement. Developing pulmonary vascular congestion and perihilar edema since previous study. Asymmetric increased opacity on the right may indicate superimposed pneumonia  or asymmetrical edema. No pneumothorax. No pleural effusions. Calcification of the aorta. IMPRESSION: Cardiac enlargement with developing pulmonary vascular congestion and perihilar edema since previous study. Increased opacity on the right lung may indicate superimposed pneumonia or asymmetrical edema. Electronically Signed   By: Lucienne Capers M.D.   On: 08/16/2017 03:17    ASSESSMENT & PLAN:   84 year old male patient with history of MDS-is currently admitted to the hospital for pneumonia/and STEMI  #MDS-likley low-grade-needing Aranesp/platelets 80s-90s.  I think it is okay to proceed with Plavix given his non-STEMI.  Patient and his wife understand that this would potentially put him at high risk for gum bleeding/nosebleeds.  However given the risk benefits it is reasonable to proceed with Plavix.  Patient needs Aranesp every 3-4 weeks to keep the hemoglobin around 10.  He is interested in having to move his care to [facility] given the drive/ his daughter taking off work.   #Non-STEMI-/CAD; severe aortic stenosis-currently being treated with conservative measures.  Plavix as above.  Thank you Dr. Anselm Jungling for allowing me to participate in the care of your pleasant patient. Please do not hesitate to contact me with questions or concerns in the interim. Discussed with Dr.Vachhani.   #Will make appointment for the patient to follow-up in the cancer center end of the week/Aranesp.  We will also route my note to Dr. Ma/PCP at [facility].  Wife was  also encouraged to call Dr. Gaylyn Cheers office to discuss the preference to be treated/followed locally.   All questions were answered. The patient knows to call the clinic with any problems, questions or concerns.    Cammie Sickle, MD 08/19/2017 1:49 PM

## 2017-08-19 NOTE — Discharge Summary (Signed)
[facility] NAME: Terry Gonzales    MR#:  [id number]  DATE OF BIRTH:  1925-06-17  DATE OF ADMISSION:  08/14/2017 ADMITTING PHYSICIAN: Gearlean Alf, PA-C  DATE OF DISCHARGE: 08/19/2017   PRIMARY CARE PHYSICIAN: [facility]    ADMISSION DIAGNOSIS:  NSTEMI (non-ST elevated myocardial infarction) ([place]) [I21.4] Acute renal failure, unspecified acute renal failure type ([place]) [N17.9] Pneumonia of both lower lobes due to infectious organism ([place]) [J18.1]  DISCHARGE DIAGNOSIS:  Active Problems:   Pneumonia   NSTEMI (non-ST elevated myocardial infarction) ([place])   Acute combined systolic and diastolic heart failure (H[facility])   Severe aortic stenosis   SECONDARY DIAGNOSIS:   Past Medical History:  Diagnosis Date  . Diabetes mellitus without complication ([place])   . Hypertension   . Myelodysplastic syndrome [facility])     HOSPITAL COURSE:   1.sepsis,Bilateral pneumonia     Abx, IV fluids, hypotensive, so transferred to stepdown.    BP stable now. Improved, move back to medical floor 08/17/17.    Walked with PT, without any requirement of oxygen. 2.Non-ST elevation MI   heparin drip,cardiology consulted.   Cath done- Have 3 vessel severe disease- and severe Aortic stenosis.   Dr. Rockey Situ spoke to family- plan is for conservative medical management.   On ASA, metoprolol, Statin.    Cardio also suggesting plavix, if OK by hematology.   Stopped heparine drip after 48 hrs.   Hematology approved use of plavix ( Dr. Burlene Arnt on phone with me.) 3.Acute renal failure likely prerenal secondary to dehydration,will start IV fluids and monitor kidney function daily. Better now. 4.Diabetes type 2 stable continue low-carb diet. 5.Hypertension history- but hypotensive here with sepsis, hold meds.   Now stable on low dose metoprolol. 6.MDS stable hemoglobin level 8.6,continue to monitor.   He follows at [facility] and this is some injections every 3 weeks as per family.   Blood transfusion to keep Hb > 9   Called heme consult to discuss starting plavix. Cont to follow with your hematologist as out pt.    DISCHARGE CONDITIONS:   Stable.  CONSULTS OBTAINED:  Treatment Team:  Minna Merritts, MD Cammie Sickle, MD  DRUG ALLERGIES:   Allergies  Allergen Reactions  . Penicillins Swelling    Has patient had a PCN reaction causing immediate rash, facial/tongue/throat swelling, SOB or lightheadedness with hypotension: Yes Has patient had a PCN reaction causing severe rash involving mucus membranes or skin necrosis: No Has patient had a PCN reaction that required hospitalization: No Has patient had a PCN reaction occurring within the last 10 years: No If all of the above answers are "NO", then may proceed with Cephalosporin use.    DISCHARGE MEDICATIONS:   Allergies as of 08/19/2017      Reactions   Penicillins Swelling   Has patient had a PCN reaction causing immediate rash, facial/tongue/throat swelling, SOB or lightheadedness with hypotension: Yes Has patient had a PCN reaction causing severe rash involving mucus membranes or skin necrosis: No Has patient had a PCN reaction that required hospitalization: No Has patient had a PCN reaction occurring within the last 10 years: No If all of the above answers are "NO", then may proceed with Cephalosporin use.      Medication List    STOP taking these medications   azithromycin 250 MG tablet Commonly known as:  ZITHROMAX Z-PAK   lisinopril 20 MG tablet Commonly known as:  PRINIVIL,ZESTRIL     TAKE these medications   albuterol 108 (90 Base) MCG/ACT inhaler Commonly known as:  PROVENTIL HFA;VENTOLIN HFA Inhale 2 puffs into the lungs every 6 (six) hours as needed for wheezing or shortness of breath.   aspirin 81 MG EC tablet Take 1 tablet (81 mg total) by mouth daily. Start taking on:  08/20/2017   bisacodyl 5 MG EC  tablet Commonly known as:  DULCOLAX Take 1 tablet (5 mg total) by mouth daily as needed for moderate constipation.   clopidogrel 75 MG tablet Commonly known as:  PLAVIX Take 1 tablet (75 mg total) by mouth daily. Start taking on:  08/20/2017   levofloxacin 500 MG tablet Commonly known as:  LEVAQUIN Take 1 tablet (500 mg total) by mouth daily for 2 days. Start taking on:  08/20/2017   losartan 25 MG tablet Commonly known as:  COZAAR Take 0.5 tablets (12.5 mg total) by mouth daily. Start taking on:  08/20/2017   metoprolol succinate 25 MG 24 hr tablet Commonly known as:  TOPROL-XL Take 0.5 tablets (12.5 mg total) by mouth daily. Start taking on:  08/20/2017   MULTI-VITAMINS Tabs Take 1 tablet by mouth daily.   naproxen sodium 220 MG tablet Commonly known as:  ALEVE Take 220 mg by mouth daily.   Omega-3 1000 MG Caps Take 1,000 mg by mouth daily.   simvastatin 10 MG tablet Commonly known as:  ZOCOR Take 10 mg by mouth daily.        DISCHARGE INSTRUCTIONS:    Follow with cardio clinic in 1-2 weeks.  If you experience worsening of your admission symptoms, develop shortness of breath, life threatening emergency, suicidal or homicidal thoughts you must seek medical attention immediately by calling 911 or calling your MD immediately  if symptoms less severe.  You Must read complete instructions/literature along with all the possible adverse reactions/side effects for all the Medicines you take and that have been prescribed to you. Take any new Medicines after you have completely understood and accept all the possible adverse reactions/side effects.   Please note  You were cared for by a hospitalist during your hospital stay. If you have any questions about your discharge medications or the care you received while you were in the hospital after you are discharged, you can call the unit and asked to speak with the hospitalist on call if the hospitalist that took care of you is not  available. Once you are discharged, your primary care physician will handle any further medical issues. Please note that NO REFILLS for any discharge medications will be authorized once you are discharged, as it is imperative that you return to your primary care physician (or establish a relationship with a primary care physician if you do not have one) for your aftercare needs so that they can reassess your need for medications and monitor your lab values.    Today   CHIEF COMPLAINT:   Chief Complaint  Patient presents with  . Pneumonia    HISTORY OF PRESENT ILLNESS:  Bradshaw Minihan  is a 84 y.o. male with a known history of diabetes hypertension and myelodysplastic syndrome.  He was brought to the hospital for cough and chest pain.  Patient describes the chest pain As retrosternal brought on by cough 6 out of 10 in severity without any radiation.  Per patient the pain is not worsening with physical activity.  He has been coughing with yellow sputum for the past 3 days.  Patient denies  any fever or chills at home no sick contacts.  While in the emergency room he was found with elevated troponin level.  Patient is admitted for further evaluation and treatment.   VITAL SIGNS:  Blood pressure 120/64, pulse 63, temperature 97.7 F (36.5 C), temperature source Oral, resp. rate 18, height 6' (1.829 m), weight 73.5 kg (162 lb 0.6 oz), SpO2 93 %.  I/O:    Intake/Output Summary (Last 24 hours) at 08/19/2017 1339 Last data filed at 08/19/2017 0428 Gross per 24 hour  Intake -  Output 1150 ml  Net -1150 ml    PHYSICAL EXAMINATION:   GENERAL:  84 y.o.-year-old patient lying in the bed with no acute distress.  EYES: Pupils equal, round, reactive to light and accommodation. No scleral icterus. Extraocular muscles intact.  HEENT: Head atraumatic, normocephalic. Oropharynx and nasopharynx clear.  NECK:  Supple, no jugular venous distention. No thyroid enlargement, no tenderness.  LUNGS: Normal  breath sounds bilaterally, no wheezing, have crepitation. No use of accessory muscles of respiration.  CARDIOVASCULAR: S1, S2 normal. No murmurs, rubs, or gallops.  ABDOMEN: Soft, nontender, nondistended. Bowel sounds present. No organomegaly or mass.  EXTREMITIES: No pedal edema, cyanosis, or clubbing.  NEUROLOGIC: Cranial nerves II through XII are intact. Muscle strength 4/5 in all extremities. Sensation intact. Gait not checked.  PSYCHIATRIC: The patient is alert and oriented x 3.  SKIN: No obvious rash, lesion, or ulcer.    DATA REVIEW:   CBC Recent Labs  Lab 08/18/17 0416  WBC 10.4  HGB 8.9*  HCT 27.1*  PLT 88*    Chemistries  Recent Labs  Lab 08/13/17 1358  08/17/17 0438 08/18/17 0416  NA 136   < > 135 139  K 4.8   < > 3.4* 4.1  CL 103   < > 104 107  CO2 26   < > 24 25  GLUCOSE 120*   < > 107* 139*  BUN 20   < > 31* 31*  CREATININE 1.00   < > 0.98 0.98  CALCIUM 9.3   < > 8.2* 8.6*  MG  --   --  2.1  --   AST 29  --   --   --   ALT 12*  --   --   --   ALKPHOS 98  --   --   --   BILITOT 1.0  --   --   --    < > = values in this interval not displayed.    Cardiac Enzymes Recent Labs  Lab 08/16/17 0513  TROPONINI 20.26*    Microbiology Results  Results for orders placed or performed during the hospital encounter of 08/14/17  Culture, blood (routine x 2)     Status: None   Collection Time: 08/14/17  7:44 PM  Result Value Ref Range Status   Specimen Description BLOOD LAC  Final   Special Requests   Final    BOTTLES DRAWN AEROBIC AND ANAEROBIC Blood Culture adequate volume   Culture   Final    NO GROWTH 5 DAYS Performed at [facility], [address] [place],  [address]    Report Status 08/19/2017 FINAL  Final  Culture, blood (routine x 2)     Status: None   Collection Time: 08/14/17  7:44 PM  Result Value Ref Range Status   Specimen Description BLOOD LEFT WRIST  Final   Special Requests   Final    BOTTLES DRAWN AEROBIC AND  ANAEROBIC Blood  Culture adequate volume   Culture   Final    NO GROWTH 5 DAYS Performed at [facility], [address] [place], [place] [address]    Report Status 08/19/2017 FINAL  Final  MRSA PCR Screening     Status: Abnormal   Collection Time: 08/15/17 11:20 AM  Result Value Ref Range Status   MRSA by PCR POSITIVE (A) NEGATIVE Final    Comment:        The GeneXpert MRSA Assay (FDA approved for NASAL specimens only), is one component of a comprehensive MRSA colonization surveillance program. It is not intended to diagnose MRSA infection nor to guide or monitor treatment for MRSA infections. RESULT CALLED TO, READ BACK BY AND VERIFIED WITH: [facility] AT 1335 08/15/17 SDR Performed at [facility], [address], [place], [place] [address]     RADIOLOGY:  No results found.  EKG:   Orders placed or performed during the hospital encounter of 08/14/17  . ED EKG within 10 minutes  . ED EKG within 10 minutes  . EKG 12-Lead  . EKG 12-Lead  . EKG 12-Lead  . EKG 12-Lead  . EKG 12-Lead  . EKG 12-Lead  . EKG 12-Lead  . EKG 12-Lead      Management plans discussed with the patient, family and they are in agreement.  CODE STATUS:     Code Status Orders  (From admission, onward)        Start     Ordered   08/14/17 2213  Full code  Continuous     08/14/17 2212    Code Status History    Date Active Date Inactive Code Status Order ID Comments User Context   This patient has a current code status but no historical code status.    Advance Directive Documentation     Most Recent Value  Type of Advance Directive  Healthcare Power of Attorney  Pre-existing out of facility DNR order (yellow form or pink MOST form)  No data  "MOST" Form in Place?  No data      TOTAL TIME TAKING CARE OF THIS PATIENT: 35 minutes.    Vaughan Basta M.D on 08/19/2017 at 1:39 PM  Between 7am to 6pm - Pager - [phone]  After 6pm go to [email or web address] -  password [email or web address] [facility]  Office  [phone]  CC: Primary care physician; [facility]   Note: This dictation was prepared with Dragon dictation along with smaller phrase technology. Any transcriptional errors that result from this process are unintentional.

## 2017-08-19 NOTE — Evaluation (Signed)
Physical Therapy Evaluation Patient Details Name: Terry Gonzales MRN: [id number] DOB: 1924/12/01 Today's Date: 08/19/2017   History of Present Illness  Pt is a 84 y.o. male presenting to hospital with cough and chest pain.  Pt admitted with B PNA, NSTEMI, acute renal failure; also with elevated troponin (peak 25.82) and hypotensive during hospitalization.  S/p multiple PRBC transfusions and s/p cath 08/16/17.  PMH includes DM, htn, myelodysplastic syndrome.  Clinical Impression  Prior to hospital admission, pt was modified independent ambulating with SPC.  Pt lives with his wife in 1 level home with 1 step to enter (with L grab bar).  Currently pt is independent supine to sit; CGA with transfers and ambulation 160 feet with RW.  Pt demonstrating generalized weakness (pt reports today is his first time out of bed in 5 days) and requiring RW for balance and support with ambulation (pt demonstrating steady safe ambulation using RW during session).  Pt would benefit from skilled PT to address noted impairments and functional limitations (see below for any additional details).  Upon hospital discharge, recommend pt discharge to home with HHPT, use of RW, and support of family.    Follow Up Recommendations Home health PT    Equipment Recommendations  Rolling walker with 5" wheels    Recommendations for Other Services       Precautions / Restrictions Precautions Precautions: Fall Restrictions Weight Bearing Restrictions: No      Mobility  Bed Mobility Overal bed mobility: Independent             General bed mobility comments: Supine to sit with bed flat with mild increased effort.  Transfers Overall transfer level: Needs assistance Equipment used: Rolling walker (2 wheeled) Transfers: Sit to/from Stand Sit to Stand: Min guard         General transfer comment: Sit to/from stand with RW CGA with very mild increased effort to stand from bed and recliner (initial vc's for hand  placement/walker use required)  Ambulation/Gait Ambulation/Gait assistance: Min guard Ambulation Distance (Feet): 160 Feet Assistive device: Rolling walker (2 wheeled)   Gait velocity: decreased   General Gait Details: step through but decrease B step length; steady with RW; initial vc's for walker use/technique (positioning within RW and with turns)  Financial trader [employer] (Stroke Patients Only)       Balance Overall balance assessment: Needs assistance Sitting-balance support: No upper extremity supported;Feet supported Sitting balance-Leahy Scale: Normal Sitting balance - Comments: steady sitting reaching within BOS   Standing balance support: No upper extremity supported Standing balance-Leahy Scale: Fair Standing balance comment: steady standing without UE support (needs single UE support reaching outside BOS)                             Pertinent Vitals/Pain Pain Assessment: No/denies pain  Vitals (HR 85-104 bpm during session and O2 92% post ambulation on room air) stable and WFL throughout treatment session.    Home Living Family/patient expects to be discharged to:: Private residence Living Arrangements: Spouse/significant other Available Help at Discharge: Neighbor;Friend(s) Type of Home: House Home Access: Stairs to enter   [employer] of Steps: 1 with L grab bar Home Layout: One level Home Equipment: Toilet riser;Grab bars - tub/shower;Walker - 2 wheels;Cane - single point;Bedside commode      Prior Function Level of Independence: Needs assistance   Gait /  Transfers Assistance Needed: Modified independent ambulating with SPC.  Pt reports no falls in past 6 months.     Comments: Daughter does the driving at night.  Wife does most of the driving during the day.     Hand Dominance   Dominant Hand: Right    Extremity/Trunk Assessment   Upper Extremity Assessment Upper Extremity  Assessment: Generalized weakness    Lower Extremity Assessment Lower Extremity Assessment: Generalized weakness    Cervical / Trunk Assessment Cervical / Trunk Assessment: Normal  Communication   Communication: HOH(Decreased L ear compared to R.)  Cognition Arousal/Alertness: Awake/alert Behavior During Therapy: WFL for tasks assessed/performed Overall Cognitive Status: Within Functional Limits for tasks assessed                                        General Comments General comments (skin integrity, edema, etc.): Pt resting in bed upon PT arrival; pt's wife present during session.  Nursing cleared pt for participation in physical therapy.  Pt agreeable to PT session.    Exercises  Gait and transfer training with RW.   Assessment/Plan    PT Assessment Patient needs continued PT services  PT Problem List Decreased strength;Decreased activity tolerance;Decreased balance;Decreased mobility;Decreased knowledge of use of DME;Decreased knowledge of precautions       PT Treatment Interventions DME instruction;Gait training;Stair training;Functional mobility training;Therapeutic activities;Therapeutic exercise;Balance training;Patient/family education    PT Goals (Current goals can be found in the Care Plan section)  Acute Rehab PT Goals Patient Stated Goal: to go home PT Goal Formulation: With patient/family Time For Goal Achievement: 09/02/17 Potential to Achieve Goals: Good    Frequency Min 2X/week   Barriers to discharge        Co-evaluation               AM-PAC PT "6 Clicks" Daily Activity  Outcome Measure Difficulty turning over in bed (including adjusting bedclothes, sheets and blankets)?: None Difficulty moving from lying on back to sitting on the side of the bed? : A Little Difficulty sitting down on and standing up from a chair with arms (e.g., wheelchair, bedside commode, etc,.)?: A Little Help needed moving to and from a bed to chair  (including a wheelchair)?: A Little Help needed walking in hospital room?: A Little Help needed climbing 3-5 steps with a railing? : A Little 6 Click Score: 19    End of Session Equipment Utilized During Treatment: Gait belt Activity Tolerance: Patient tolerated treatment well Patient left: in chair;with call bell/phone within reach;with chair alarm set;with family/visitor present Nurse Communication: Mobility status;Precautions PT Visit Diagnosis: Other abnormalities of gait and mobility (R26.89);Muscle weakness (generalized) (M62.81)    Time: 6237-6283 PT Time Calculation (min) (ACUTE ONLY): 53 min   Charges:   PT Evaluation $PT Eval Low Complexity: 1 Low PT Treatments $Gait Training: 8-22 mins $Therapeutic Activity: 8-22 mins   PT G Codes:   PT G-Codes **NOT FOR INPATIENT CLASS** Functional Assessment Tool Used: AM-PAC 6 Clicks Basic Mobility Functional Limitation: Mobility: Walking and moving around Mobility: Walking and Moving Around Current Status (T5176): At least 20 percent but less than 40 percent impaired, limited or restricted Mobility: Walking and Moving Around Goal Status [phone]): 0 percent impaired, limited or restricted    Leitha Bleak, PT 08/19/17, 11:22 AM [phone]

## 2017-08-19 NOTE — Telephone Encounter (Signed)
Patient currently admitted at this time. 

## 2017-08-19 NOTE — Progress Notes (Signed)
Progress Note  Patient Name: Terry Gonzales Date of Encounter: 08/19/2017  Primary Cardiologist: new seen by Rockey Situ  Subjective   He feels better today overall.  He had mild chest discomfort yesterday.He continues to have dry cough.  Inpatient Medications    Scheduled Meds: . aspirin EC  81 mg Oral Daily  . budesonide (PULMICORT) nebulizer solution  0.5 mg Nebulization BID  . Chlorhexidine Gluconate Cloth  6 each Topical Q0600  . clopidogrel  75 mg Oral Daily  . docusate sodium  100 mg Oral BID  . ipratropium-albuterol  3 mL Nebulization Q6H  . losartan  12.5 mg Oral Daily  . mouth rinse  15 mL Mouth Rinse BID  . metoprolol succinate  12.5 mg Oral Daily  . multivitamin with minerals  1 tablet Oral Daily  . mupirocin ointment  1 application Nasal BID  . naproxen  250 mg Oral Daily  . omega-3 acid ethyl esters  1,000 mg Oral Daily  . predniSONE  20 mg Oral Q breakfast  . simvastatin  10 mg Oral Daily  . sodium chloride flush  3 mL Intravenous Q12H   Continuous Infusions: . sodium chloride    . levofloxacin (LEVAQUIN) IV 500 mg (08/19/17 0901)   PRN Meds: acetaminophen **OR** acetaminophen, bisacodyl, guaiFENesin, HYDROcodone-acetaminophen, ondansetron **OR** ondansetron (ZOFRAN) IV, traZODone   Vital Signs    Vitals:   08/18/17 2130 08/19/17 0424 08/19/17 0757 08/19/17 0805  BP: 133/70 (!) 142/70 120/64   Pulse: 66 70 63   Resp: 12 16 18    Temp:  98.9 F (37.2 C) 97.7 F (36.5 C)   TempSrc:  Oral Oral   SpO2: 97% 98% 93% 93%  Weight:      Height:        Intake/Output Summary (Last 24 hours) at 08/19/2017 1021 Last data filed at 08/19/2017 0428 Gross per 24 hour  Intake -  Output 1150 ml  Net -1150 ml   Filed Weights   08/16/17 0249 08/16/17 1002 08/18/17 0456  Weight: 157 lb 13.6 oz (71.6 kg) 157 lb (71.2 kg) 162 lb 0.6 oz (73.5 kg)    Telemetry    Normal sinus rhythm with no significant arrhythmia- Personally Reviewed  ECG    Not done  Physical  Exam   GEN: No acute distress.   Neck: No JVD Cardiac: RRR,  2 out of 6 crescendo decrescendo systolic murmur in the aortic area which is mid peaking, rubs, or gallops.  Respiratory:   Diffuse rhonchi with mildly diminished breath sounds. GI: Soft, nontender, non-distended  MS: No edema; No deformity. Neuro:  Nonfocal  Psych: Normal affect   Labs    Chemistry Recent Labs  Lab 08/13/17 1358  08/16/17 0513 08/17/17 0438 08/18/17 0416  NA 136   < > 134* 135 139  K 4.8   < > 3.8 3.4* 4.1  CL 103   < > 103 104 107  CO2 26   < > 21* 24 25  GLUCOSE 120*   < > 137* 107* 139*  BUN 20   < > 36* 31* 31*  CREATININE 1.00   < > 1.10 0.98 0.98  CALCIUM 9.3   < > 8.4* 8.2* 8.6*  PROT 7.3  --   --   --   --   ALBUMIN 3.8  --   --   --   --   AST 29  --   --   --   --   ALT 12*  --   --   --   --  ALKPHOS 98  --   --   --   --   BILITOT 1.0  --   --   --   --   GFRNONAA >60   < > 57* >60 >60  GFRAA >60   < > >60 >60 >60  ANIONGAP 7   < > 10 7 7    < > = values in this interval not displayed.     Hematology Recent Labs  Lab 08/16/17 0513 08/17/17 0438 08/18/17 0416  WBC 16.4* 9.7 10.4  RBC 2.63* 2.53* 2.56*  HGB 8.9* 9.0* 8.9*  HCT 27.8* 26.9* 27.1*  MCV 105.9* 106.2* 106.0*  MCH 34.0 35.6* 35.0*  MCHC 32.1 33.5 33.0  RDW 20.6* 20.0* 19.5*  PLT 76* 84* 88*    Cardiac Enzymes Recent Labs  Lab 08/15/17 0530 08/15/17 1146 08/15/17 1746 08/16/17 0513  TROPONINI 9.24* 25.82* 24.15* 20.26*   No results for input(s): TROPIPOC in the last 168 hours.   BNPNo results for input(s): BNP, PROBNP in the last 168 hours.   DDimer No results for input(s): DDIMER in the last 168 hours.   Radiology    No results found.  Cardiac Studies  Both the studies were personally reviewed by me.  Echo 08/15/17: Study Conclusions  - Left ventricle: The cavity size was mildly dilated. Systolic function was moderately to severely reduced. The estimated ejection fraction was in the  range of 30% to 35%. Hypokinesis of the anteroseptal myocardium. Hypokinesis of the anterior myocardium. Hypokinesis of the apical myocardium. Doppler parameters are consistent with abnormal left ventricular relaxation (grade 1 diastolic dysfunction). - Aortic valve: There was moderate stenosis. Stenosis may be underestimated secondary to low EF. Valve area (VTI): 1.12 cm^2. - Left atrium: The atrium was normal in size. - Right ventricle: Systolic function was normal. - Pulmonary arteries: Systolic pressure could not be accurately estimated. - Inferior vena cava: The vessel was dilated. The respirophasic diameter changes were blunted (<50%), consistent with elevated central venous pressure.  LHC 08/16/17:  Ost 2nd Mrg to 2nd Mrg lesion is 80% stenosed.  Mid Cx lesion is 75% stenosed.  Ost Cx lesion is 75% stenosed.  Prox LAD lesion is 80% stenosed.  Ost LAD lesion is 75% stenosed.  Ost RCA lesion is 99% stenosed.  Mid RCA lesion is 95% stenosed.  Dist RCA lesion is 99% stenosed.    Patient Profile     84 y.o. male with MDS and otherwise in excellent health and condition admitted 08/2017 with CAP, sepsis and hypoxic respiratory failure.  He was subsequently found to have NSTEMI with peak troponin of 25 .   Assessment & Plan    1.  Non-ST elevation myocardial infarction: Cardiac catheterization last week showed significant three-vessel coronary artery disease.  The coronary vessels were overall heavily calcified and diffusely diseased.  I reviewed the images.  I suspect that the culprit was the distal right coronary artery which was occluded.  Left to right collaterals were noted.  No optimal PCI targets.  Given his age and comorbidities, he is not a good candidate for CABG. I agree with medical therapy.  Clopidogrel was added.  Continue small dose Toprol and losartan. We can consider adding long-acting nitroglycerin outpatient for recurrent angina.  2.   Aortic stenosis: This was determined to be moderate to severe and possibly severe.  However, based on his physical exam and valve opening on echocardiogram, I suspect that this is likely moderate.  3.  Pneumonia: Currently on antibiotics.  The patient is stable for discharge from a cardiac standpoint pending PT evaluation.  We will arrange for follow-up in our office in 2 weeks.  For questions or updates, please contact [place] Please consult [email or web address] for contact info under Cardiology/STEMI.      Signed, Kathlyn Sacramento, MD  08/19/2017, 10:21 AM

## 2017-08-20 ENCOUNTER — Other Ambulatory Visit: Payer: Self-pay | Admitting: Internal Medicine

## 2017-08-20 DIAGNOSIS — D4620 Refractory anemia with excess of blasts, unspecified: Secondary | ICD-10-CM

## 2017-08-20 NOTE — Telephone Encounter (Signed)
Pt's wife, Terry Gonzales, called back.  Patient contacted regarding discharge from [facility] on 08/19/2016.  Patient understands to follow up with provider Dr. Rockey Situ on 08/29/17 at 3:20pm at [facility]. Patient understands discharge instructions? yes Patient understands medications and regiment? yes Patient understands to bring all medications to this visit? Yes  Pt's wife and pt verbalized understanding. Informed wife of valet parking in front of the [facility] and wheelchair availability.

## 2017-08-20 NOTE — Telephone Encounter (Signed)
Left message on voicemail for patient to contact the office.

## 2017-08-20 NOTE — Progress Notes (Signed)
Lab orders have been entered.

## 2017-08-20 NOTE — Progress Notes (Signed)
#   Schedule pt for aranesp on jan 11th; with cbc.  # follow up with me on feb 1st with labs-  cbc/cmp/ironstudies/ferritin; aranesp.

## 2017-08-23 ENCOUNTER — Inpatient Hospital Stay: Payer: Medicare Other | Attending: Internal Medicine

## 2017-08-23 ENCOUNTER — Inpatient Hospital Stay: Payer: Medicare Other

## 2017-08-23 DIAGNOSIS — D469 Myelodysplastic syndrome, unspecified: Secondary | ICD-10-CM | POA: Insufficient documentation

## 2017-08-23 DIAGNOSIS — D4620 Refractory anemia with excess of blasts, unspecified: Secondary | ICD-10-CM

## 2017-08-23 LAB — CBC WITH DIFFERENTIAL/PLATELET
BASOS ABS: 0.1 10*3/uL (ref 0–0.1)
Basophils Relative: 1 %
EOS PCT: 1 %
Eosinophils Absolute: 0 10*3/uL (ref 0–0.7)
HEMATOCRIT: 30.1 % — AB (ref 40.0–52.0)
Hemoglobin: 10 g/dL — ABNORMAL LOW (ref 13.0–18.0)
LYMPHS PCT: 23 %
Lymphs Abs: 1.1 10*3/uL (ref 1.0–3.6)
MCH: 35 pg — ABNORMAL HIGH (ref 26.0–34.0)
MCHC: 33.1 g/dL (ref 32.0–36.0)
MCV: 105.8 fL — ABNORMAL HIGH (ref 80.0–100.0)
Monocytes Absolute: 0.8 10*3/uL (ref 0.2–1.0)
Monocytes Relative: 17 %
NEUTROS ABS: 2.7 10*3/uL (ref 1.4–6.5)
NEUTROS PCT: 58 %
Platelets: 182 10*3/uL (ref 150–440)
RBC: 2.85 MIL/uL — AB (ref 4.40–5.90)
RDW: 18.7 % — AB (ref 11.5–14.5)
WBC: 4.6 10*3/uL (ref 3.8–10.6)

## 2017-08-24 ENCOUNTER — Other Ambulatory Visit: Payer: Self-pay

## 2017-08-24 ENCOUNTER — Encounter: Payer: Self-pay | Admitting: Emergency Medicine

## 2017-08-24 ENCOUNTER — Emergency Department: Payer: Medicare Other

## 2017-08-24 ENCOUNTER — Emergency Department
Admission: EM | Admit: 2017-08-24 | Discharge: 2017-08-24 | Disposition: A | Discharge status: Home / Self Care | Payer: Medicare Other | Attending: Emergency Medicine | Admitting: Emergency Medicine

## 2017-08-24 DIAGNOSIS — Z7902 Long term (current) use of antithrombotics/antiplatelets: Secondary | ICD-10-CM | POA: Diagnosis not present

## 2017-08-24 DIAGNOSIS — R05 Cough: Secondary | ICD-10-CM | POA: Insufficient documentation

## 2017-08-24 DIAGNOSIS — I5041 Acute combined systolic (congestive) and diastolic (congestive) heart failure: Secondary | ICD-10-CM | POA: Insufficient documentation

## 2017-08-24 DIAGNOSIS — I110 Hypertensive heart disease with heart failure: Secondary | ICD-10-CM | POA: Insufficient documentation

## 2017-08-24 DIAGNOSIS — Z87891 Personal history of nicotine dependence: Secondary | ICD-10-CM | POA: Diagnosis not present

## 2017-08-24 DIAGNOSIS — I252 Old myocardial infarction: Secondary | ICD-10-CM | POA: Diagnosis not present

## 2017-08-24 DIAGNOSIS — R0602 Shortness of breath: Secondary | ICD-10-CM | POA: Insufficient documentation

## 2017-08-24 DIAGNOSIS — Z79899 Other long term (current) drug therapy: Secondary | ICD-10-CM | POA: Diagnosis not present

## 2017-08-24 DIAGNOSIS — Z7982 Long term (current) use of aspirin: Secondary | ICD-10-CM | POA: Insufficient documentation

## 2017-08-24 DIAGNOSIS — E119 Type 2 diabetes mellitus without complications: Secondary | ICD-10-CM | POA: Insufficient documentation

## 2017-08-24 DIAGNOSIS — R059 Cough, unspecified: Secondary | ICD-10-CM

## 2017-08-24 LAB — URINALYSIS, ROUTINE W REFLEX MICROSCOPIC
BILIRUBIN URINE: NEGATIVE
Glucose, UA: NEGATIVE mg/dL
Ketones, ur: NEGATIVE mg/dL
Leukocytes, UA: NEGATIVE
NITRITE: NEGATIVE
Protein, ur: NEGATIVE mg/dL
Specific Gravity, Urine: 1.012 (ref 1.005–1.030)
pH: 6 (ref 5.0–8.0)

## 2017-08-24 LAB — CBC
HEMATOCRIT: 31.2 % — AB (ref 40.0–52.0)
Hemoglobin: 10.4 g/dL — ABNORMAL LOW (ref 13.0–18.0)
MCH: 35.3 pg — AB (ref 26.0–34.0)
MCHC: 33.2 g/dL (ref 32.0–36.0)
MCV: 106.3 fL — AB (ref 80.0–100.0)
Platelets: 178 10*3/uL (ref 150–440)
RBC: 2.94 MIL/uL — ABNORMAL LOW (ref 4.40–5.90)
RDW: 19.2 % — AB (ref 11.5–14.5)
WBC: 3.2 10*3/uL — ABNORMAL LOW (ref 3.8–10.6)

## 2017-08-24 LAB — BASIC METABOLIC PANEL
Anion gap: 12 (ref 5–15)
BUN: 25 mg/dL — AB (ref 6–20)
CHLORIDE: 103 mmol/L (ref 101–111)
CO2: 24 mmol/L (ref 22–32)
Calcium: 9.5 mg/dL (ref 8.9–10.3)
Creatinine, Ser: 0.93 mg/dL (ref 0.61–1.24)
GFR calc Af Amer: >60 mL/min (ref >60)
GFR calc non Af Amer: >60 mL/min (ref >60)
GLUCOSE: 175 mg/dL — AB (ref 65–99)
Potassium: 4.2 mmol/L (ref 3.5–5.1)
Sodium: 139 mmol/L (ref 135–145)

## 2017-08-24 LAB — TROPONIN I: Troponin I: 0.55 ng/mL (ref <0.03)

## 2017-08-24 MED ORDER — GUAIFENESIN-CODEINE 100-10 MG/5ML PO SOLN: 5.0000 mL | Freq: Four times a day (QID) | ORAL | 0 refills | Status: DC | PRN

## 2017-08-24 NOTE — ED Notes (Signed)
ED Provider at bedside. 

## 2017-08-24 NOTE — ED Provider Notes (Signed)
[facility] Emergency Department Provider Note  Time seen: 12:13 PM  I have reviewed the triage vital signs and the nursing notes.   HISTORY  Chief Complaint Shortness of Breath    HPI Terry Gonzales is a 84 y.o. male with a past medical history of diabetes, hypertension, recent end STEMI 1 week ago who presents to the emergency department for cough and shortness of breath.  According to the patient he had a heart attack approximately 10 days ago, verified on record review.  Patient was discharged from the hospital 5 days ago.  He states since going home he has had significant shortness of breath and cough especially when attempting to lie flat.  States his shortness of breath and cough resolved when sitting upright in bed.  Denies any significant shortness of breath or cough with exertion, but when he lies down he becomes very short of breath.  Patient states he feels like he has congestion in his chest and needs to get it out but is unable to do so.  Denies any fever.  Denies any lower extremity edema.  On review of systems the patient does note frequent urination and having trouble retracting his foreskin.  While in the hospital he wore a condom catheter.   Past Medical History:  Diagnosis Date  . Diabetes mellitus without complication ([place])   . Hypertension   . Myelodysplastic syndrome [facility])     Patient Active Problem List   Diagnosis Date Noted  . MDS (myelodysplastic syndrome), low grade ([place]) 08/20/2017  . NSTEMI (non-ST elevated myocardial infarction) ([place])   . Acute combined systolic and diastolic heart failure ([place])   . Severe aortic stenosis   . Pneumonia 08/14/2017    Past Surgical History:  Procedure Laterality Date  . LEFT HEART CATH AND CORONARY ANGIOGRAPHY N/A 08/16/2017   Procedure: LEFT HEART CATH AND CORONARY ANGIOGRAPHY;  Surgeon: Minna Merritts, MD;  Location: [place] CV LAB;  Service: Cardiovascular;  Laterality: N/A;    Prior  to Admission medications   Medication Sig Start Date End Date Taking? Authorizing Provider  albuterol (PROVENTIL HFA;VENTOLIN HFA) 108 (90 Base) MCG/ACT inhaler Inhale 2 puffs into the lungs every 6 (six) hours as needed for wheezing or shortness of breath. 08/19/17   Vaughan Basta, MD  aspirin EC 81 MG EC tablet Take 1 tablet (81 mg total) by mouth daily. 08/20/17   Vaughan Basta, MD  bisacodyl (DULCOLAX) 5 MG EC tablet Take 1 tablet (5 mg total) by mouth daily as needed for moderate constipation. 08/19/17   Vaughan Basta, MD  clopidogrel (PLAVIX) 75 MG tablet Take 1 tablet (75 mg total) by mouth daily. 08/20/17   Vaughan Basta, MD  losartan (COZAAR) 25 MG tablet Take 0.5 tablets (12.5 mg total) by mouth daily. 08/20/17   Vaughan Basta, MD  metoprolol succinate (TOPROL-XL) 25 MG 24 hr tablet Take 0.5 tablets (12.5 mg total) by mouth daily. 08/20/17   Vaughan Basta, MD  Multiple Vitamin (MULTI-VITAMINS) TABS Take 1 tablet by mouth daily. 02/24/03   [provider]  naproxen sodium (ALEVE) 220 MG tablet Take 220 mg by mouth daily. 02/24/03   [provider]  Omega-3 1000 MG CAPS Take 1,000 mg by mouth daily. 09/18/11   [provider]  simvastatin (ZOCOR) 10 MG tablet Take 10 mg by mouth daily. 05/06/17   [provider]    Allergies  Allergen Reactions  . Penicillins Swelling    Has patient had a PCN reaction  causing immediate rash, facial/tongue/throat swelling, SOB or lightheadedness with hypotension: Yes Has patient had a PCN reaction causing severe rash involving mucus membranes or skin necrosis: No Has patient had a PCN reaction that required hospitalization: No Has patient had a PCN reaction occurring within the last 10 years: No If all of the above answers are "NO", then may proceed with Cephalosporin use.    Family History  Problem Relation Age of Onset  . Hypertension Father     Social History Social  History   Tobacco Use  . Smoking status: Former Smoker    Packs/day: 1.00    Types: Cigarettes    Last attempt to quit: 08/13/1968    Years since quitting: 49.0  . Smokeless tobacco: Never Used  Substance Use Topics  . Alcohol use: No    Frequency: Never  . Drug use: Not on file    Review of Systems Constitutional: Negative for fever. Eyes: Negative for visual complaints ENT: Negative for congestion  Cardiovascular: Negative for chest pain.  Positive for chest congestion. Respiratory: Positive for shortness of breath and cough when lying flat. Gastrointestinal: Negative for abdominal pain, vomiting  Genitourinary: Frequent urination Musculoskeletal: Negative for back pain. Skin: Foreskin not fully retracting. Neurological: Negative for headache All other ROS negative  ____________________________________________   PHYSICAL EXAM:  VITAL SIGNS: ED Triage Vitals [08/24/17 1042]  Enc Vitals Group     BP 107/66     Pulse Rate 74     Resp (!) 22     Temp (!) 97.5 F (36.4 C)     Temp Source Oral     SpO2 97 %     Weight 162 lb (73.5 kg)     Height      Head Circumference      Peak Flow      Pain Score      Pain Loc      Pain Edu?      Excl. in [place]?     Constitutional: Alert and oriented. Well appearing and in no distress. Eyes: Normal exam ENT   Head: Normocephalic and atraumatic.   Mouth/Throat: Mucous membranes are moist. Cardiovascular: Normal rate, regular rhythm.  Respiratory: Normal respiratory effort without tachypnea nor retractions. Breath sounds are clear and equal bilaterally. No wheezes/rales/rhonchi. Gastrointestinal: Soft and nontender. No distention. GU: Patient does appear to have some mild erythema and edema to the distal foreskin and glans of the penis, most consistent with balanitis, possibly from wearing condom catheter for prolonged period Musculoskeletal: Nontender with normal range of motion in all extremities. No lower extremity  tenderness or edema. Neurologic:  Normal speech and language. No gross focal neurologic deficits Skin:  Skin is warm, dry and intact.  Psychiatric: Mood and affect are normal.   ____________________________________________    EKG  EKG reviewed and interpreted by myself shows sinus rhythm at 74 bpm with a widened QRS, T wave inversions in the inferior lateral leads, largely unchanged from his prior EKG.  ____________________________________________    RADIOLOGY  Chest x-ray shows chronic changes but no acute disease.  ____________________________________________   INITIAL IMPRESSION / ASSESSMENT AND PLAN / ED COURSE  Pertinent labs & imaging results that were available during my care of the patient were reviewed by me and considered in my medical decision making (see chart for details).  Patient presents to the emergency department for cough and shortness of breath when lying flat, resolves upon sitting upright.  States he is not able to sleep due to  the cough at night.  Patient had a significant myocardial infarction 10 days ago.  I reviewed the patient's records, patient had a cath performed showing significant three-vessel disease, conservative medication management recommended.  The patient's results here today showed troponin 0.55 which is greatly diminished from his troponin during his NSTEMI which peaked around 25.  Denies any chest pain.  X-rays of the chest are largely normal.  No effusion edema or consolidation noted.  Patient states while sitting upright or while walking around during the day feels normal.  Denies any significant shortness of breath cough or congestion.  He states this only occurs when he lies flat but it prevents him from being able to sleep at night which is his main concern.  Patient's GU exam most consistent with a mild balanitis.  We will discharge with nystatin.  Urinalysis pending.  Urine is largely within normal limits.  Overall the patient  continues to appear extremely well.  Currently 100% on room air with a normal respiratory rate.  Normal vitals.  Denies any chest pain now or at any time recently.  Troponin is downtrending from earlier.  I believe the patient is safe for discharge home.  We will discharge with a short course of cough medication with codeine which I have instructed the patient only to take at night to help with him sleep.  Otherwise he will follow-up with his doctor.  Provided return precautions for any chest pain or increased shortness of breath.  ____________________________________________   FINAL CLINICAL IMPRESSION(S) / ED DIAGNOSES  Cough shortness of breath    Harvest Dark, MD 08/24/17 1455

## 2017-08-24 NOTE — ED Triage Notes (Signed)
Pt sent over from home with reports of possible PNE. Pt c/o increased shortness of breath.

## 2017-08-24 NOTE — Discharge Instructions (Signed)
As we discussed please take your cough medication as prescribed approximate 1 hour before your bedtime.  Return to the emergency department for any chest pain or any worsening shortness of breath.

## 2017-08-24 NOTE — ED Notes (Signed)
Pt in xray

## 2017-08-27 NOTE — Progress Notes (Addendum)
Cardiology Office Note  Date:  08/29/2017   ID:  Terry Gonzales, DOB 09/07/1924, MRN [id number]  PCP:  [facility] [place]   Chief Complaint  Patient presents with  . Other    Hospital follow up. Patient c/o SOB. Meds reviewed verbally with patient.     HPI:  Mr. Powe is a 84 year old gentleman with past medical history of MDS, hematocrit 31, blood cell count 3, platelets 178 Moderate to severe aortic stenosis Ejection fraction 30-35% Severe three-vessel coronary disease Acute renal failure Bilateral pneumonia  Presents for follow-up after recent hospitalization after NSTEMI, managed medically  Recent hospitalization January 2019 with pneumonia, respiratory distress, renal failure, non-STEMI Troponin up to 25 Cardiac catheterization performed as below  severe three-vessel disease, not a candidate for CABG at the time  He presents in follow-up today in a wheelchair, slow recovery from his pneumonia Cough dramatically improved, still feels weak Doing physical therapy at home Some medication confusion Blood pressure running low, denies orthostasis,    denies having chest pain  requesting nitroglycerin sublingual  EKG personally reviewed by myself on todays visit Shows sinus rhythm rate 71 bpm nonspecific ST and T wave abnormality anterior precordial leads, left axis deviation  Other records reviewed with him in detail  Cath 08/16/2017 Severe three vessel disease, ostial LAD, ostial LCX, occluded RCA Also with significant aortic valve stenosis, Depressed EF on echo 30 to 35% Left anterior descending (LAD): Large vessel that extends to the apical region, diagonal branch 2 of moderate size, severe ostial and proximal disease  Left circumflex (LCx): Large vessel with OM branch 2, moderate to severe ostial disease, severe OM1 and OM2 disease  Right coronary artery (RCA): Right dominant vessel , subtotally occluded ostially, severe mid vessel disease, extasia distal  vessel, collaterals from left to right, likely a CTO  Left ventriculography: unable to cross the aortic valve secondary to dilated root, heavy aortic valve calcification, likely moderate to severe, possibly severe aortic valve stenosis   PMH:   has a past medical history of Diabetes mellitus without complication ([place]), Hypertension, and Myelodysplastic syndrome ([place]).  PSH:    Past Surgical History:  Procedure Laterality Date  . LEFT HEART CATH AND CORONARY ANGIOGRAPHY N/A 08/16/2017   Procedure: LEFT HEART CATH AND CORONARY ANGIOGRAPHY;  Surgeon: Minna Merritts, MD;  Location: [place] CV LAB;  Service: Cardiovascular;  Laterality: N/A;    Current Outpatient Medications  Medication Sig Dispense Refill  . albuterol (PROVENTIL HFA;VENTOLIN HFA) 108 (90 Base) MCG/ACT inhaler Inhale 2 puffs into the lungs every 6 (six) hours as needed for wheezing or shortness of breath. 1 Inhaler 2  . aspirin EC 81 MG EC tablet Take 1 tablet (81 mg total) by mouth daily. 30 tablet 0  . bisacodyl (DULCOLAX) 5 MG EC tablet Take 1 tablet (5 mg total) by mouth daily as needed for moderate constipation. 30 tablet 0  . clopidogrel (PLAVIX) 75 MG tablet Take 1 tablet (75 mg total) by mouth daily. 30 tablet 0  . Darbepoetin Alfa (ARANESP) 500 MCG/ML SOSY injection Inject 500 mcg into the skin every 30 (thirty) days.    Marland Kitchen lisinopril (PRINIVIL,ZESTRIL) 20 MG tablet Take 20 mg by mouth daily.    . metoprolol succinate (TOPROL-XL) 25 MG 24 hr tablet Take 0.5 tablets (12.5 mg total) by mouth daily. 30 tablet 0  . Multiple Vitamin (MULTI-VITAMINS) TABS Take 1 tablet by mouth daily.    . simvastatin (ZOCOR) 10 MG tablet Take 10 mg by mouth daily.    Marland Kitchen  triamcinolone cream (KENALOG) 0.1 % Apply 1 application topically 2 (two) times daily.    . nitroGLYCERIN (NITROSTAT) 0.4 MG SL tablet Place 1 tablet (0.4 mg total) under the tongue every 5 (five) minutes as needed for chest pain. 25 tablet 3   No current  facility-administered medications for this visit.      Allergies:   Penicillins   Social History:  The patient  reports that he quit smoking about 49 years ago. His smoking use included cigarettes. He smoked 1.00 pack per day. he has never used smokeless tobacco. He reports that he does not drink alcohol.   Family History:   family history includes Hypertension in his father.    Review of Systems: Review of Systems  Constitutional: Positive for malaise/fatigue.  Respiratory: Negative.   Cardiovascular: Negative.   Gastrointestinal: Negative.   Musculoskeletal: Negative.        Fall risk, gait instability,   Neurological: Positive for weakness.  Psychiatric/Behavioral: Negative.   All other systems reviewed and are negative.    PHYSICAL EXAM: VS:  BP (!) 96/58 (BP Location: Right Arm, Patient Position: Sitting, Cuff Size: Normal)   Pulse 71   Ht 6' (1.829 m)   Wt 155 lb (70.3 kg)   BMI 21.02 kg/m  , BMI Body mass index is 21.02 kg/m. GEN: Thin, atrophy in no acute distress  HEENT: normal  Neck: no JVD, carotid bruits, or masses Cardiac: RRR; 2+ SEM RSB,  No rubs, or gallops,no edema  Respiratory:  clear to auscultation bilaterally, normal work of breathing GI: soft, nontender, nondistended, + BS MS: no deformity, + atrophy  Skin: warm and dry, no rash Neuro:  Strength and sensation are intact Psych: euthymic mood, full affect   Recent Labs: 08/13/2017: ALT 12 08/17/2017: Magnesium 2.1 08/24/2017: BUN 25; Creatinine, Ser 0.93; Hemoglobin 10.4; Platelets 178; Potassium 4.2; Sodium 139    Lipid Panel Lab Results  Component Value Date   CHOL 85 08/18/2017   HDL 18 (L) 08/18/2017   LDLCALC 51 08/18/2017   TRIG 79 08/18/2017      Wt Readings from Last 3 Encounters:  08/29/17 155 lb (70.3 kg)  08/24/17 162 lb (73.5 kg)  08/18/17 162 lb 0.6 oz (73.5 kg)       ASSESSMENT AND PLAN:  NSTEMI (non-ST elevated myocardial infarction) ([place]) - Plan: EKG 12-Lead Recent  cardiac catheterization with severe three-vessel coronary disease Still recovering from pneumonia, weak, performing home PT Denies having anginal symptoms In his current state, poor surgical candidate for bypass Would recommend he work on his nutrition, work with PT Nitroglycerin provided for any anginal symptoms Stay on aspirin and Plavix  Acute combined systolic and diastolic heart failure ([place]) - Plan: EKG 12-Lead Currently not on diuretics ,appears euvolemic reports he does not drink much fluids,   He is on losartan and lisinopril, blood pressure running low Recommended he stop the losartan Stay on lisinopril and metoprolol succinate  Severe aortic stenosis - Plan: EKG 12-Lead Low ejection fraction likely underestimating aortic valve stenosis on prior echo Would continue medical management at this time, repeat echocardiogram in follow-up Poor candidate for transesophageal echo  MDS (myelodysplastic syndrome), low grade ([place]) - Plan: EKG 12-Lead  followed by oncology  Scheduled for repeat lab work from her first 2019    Total encounter time more than 45 minutes  Greater than 50% was spent in counseling and coordination of care with the patient  Disposition:   F/U  3 months   Orders  Placed This Encounter  Procedures  . EKG 12-Lead     Signed, Esmond Plants, M.D., Ph.D. 08/29/2017  [place], [place]

## 2017-08-29 ENCOUNTER — Ambulatory Visit: Payer: Medicare Other | Admitting: Cardiovascular Disease

## 2017-08-29 ENCOUNTER — Encounter: Payer: Self-pay | Admitting: Cardiovascular Disease

## 2017-08-29 VITALS — BP 96/58 | HR 71 | Ht 72.0 in | Wt 155.0 lb

## 2017-08-29 DIAGNOSIS — I5041 Acute combined systolic (congestive) and diastolic (congestive) heart failure: Secondary | ICD-10-CM

## 2017-08-29 DIAGNOSIS — I350 Nonrheumatic aortic (valve) stenosis: Secondary | ICD-10-CM

## 2017-08-29 DIAGNOSIS — D4620 Refractory anemia with excess of blasts, unspecified: Secondary | ICD-10-CM | POA: Diagnosis not present

## 2017-08-29 DIAGNOSIS — I214 Non-ST elevation (NSTEMI) myocardial infarction: Secondary | ICD-10-CM

## 2017-08-29 DIAGNOSIS — J189 Pneumonia, unspecified organism: Secondary | ICD-10-CM

## 2017-08-29 MED ORDER — NITROGLYCERIN 0.4 MG SL SUBL: 0.4000 mg | SUBLINGUAL_TABLET | SUBLINGUAL | 3 refills | Status: DC | PRN

## 2017-08-29 NOTE — Patient Instructions (Addendum)
Medication Instructions:   Please hold the losartan  Take nitro for chest pain  Labwork:  No new labs needed  Testing/Procedures:  No further testing at this time   Follow-Up: It was a pleasure seeing you in the office today. Please call us if you have new issues that need to be addressed before your next appt.  [phone]  Your physician wants you to follow-up in: 3 months.  You will receive a reminder letter in the mail two months in advance. If you don't receive a letter, please call our office to schedule the follow-up appointment.  If you need a refill on your cardiac medications before your next appointment, please call your pharmacy.

## 2017-09-01 ENCOUNTER — Other Ambulatory Visit: Payer: Self-pay

## 2017-09-01 ENCOUNTER — Emergency Department: Payer: Medicare Other

## 2017-09-01 ENCOUNTER — Observation Stay
Admission: EM | Admit: 2017-09-01 | Discharge: 2017-09-02 | Disposition: A | Discharge status: Home / Self Care | Payer: Medicare Other | Attending: Internal Medicine | Admitting: Internal Medicine

## 2017-09-01 DIAGNOSIS — E119 Type 2 diabetes mellitus without complications: Secondary | ICD-10-CM | POA: Diagnosis not present

## 2017-09-01 DIAGNOSIS — I200 Unstable angina: Secondary | ICD-10-CM | POA: Diagnosis present

## 2017-09-01 DIAGNOSIS — Z7982 Long term (current) use of aspirin: Secondary | ICD-10-CM | POA: Diagnosis not present

## 2017-09-01 DIAGNOSIS — I10 Essential (primary) hypertension: Secondary | ICD-10-CM

## 2017-09-01 DIAGNOSIS — I255 Ischemic cardiomyopathy: Secondary | ICD-10-CM | POA: Diagnosis not present

## 2017-09-01 DIAGNOSIS — I25110 Atherosclerotic heart disease of native coronary artery with unstable angina pectoris: Secondary | ICD-10-CM

## 2017-09-01 DIAGNOSIS — I248 Other forms of acute ischemic heart disease: Secondary | ICD-10-CM

## 2017-09-01 DIAGNOSIS — I120 Hypertensive chronic kidney disease with stage 5 chronic kidney disease or end stage renal disease: Secondary | ICD-10-CM | POA: Diagnosis not present

## 2017-09-01 DIAGNOSIS — Z7902 Long term (current) use of antithrombotics/antiplatelets: Secondary | ICD-10-CM | POA: Diagnosis not present

## 2017-09-01 DIAGNOSIS — D469 Myelodysplastic syndrome, unspecified: Secondary | ICD-10-CM

## 2017-09-01 DIAGNOSIS — Z79899 Other long term (current) drug therapy: Secondary | ICD-10-CM | POA: Diagnosis not present

## 2017-09-01 DIAGNOSIS — Z66 Do not resuscitate: Secondary | ICD-10-CM | POA: Insufficient documentation

## 2017-09-01 DIAGNOSIS — I110 Hypertensive heart disease with heart failure: Secondary | ICD-10-CM | POA: Insufficient documentation

## 2017-09-01 DIAGNOSIS — I350 Nonrheumatic aortic (valve) stenosis: Secondary | ICD-10-CM | POA: Insufficient documentation

## 2017-09-01 DIAGNOSIS — D649 Anemia, unspecified: Secondary | ICD-10-CM | POA: Diagnosis not present

## 2017-09-01 DIAGNOSIS — Z880 Allergy status to penicillin: Secondary | ICD-10-CM | POA: Insufficient documentation

## 2017-09-01 DIAGNOSIS — I5022 Chronic systolic (congestive) heart failure: Secondary | ICD-10-CM

## 2017-09-01 DIAGNOSIS — R079 Chest pain, unspecified: Secondary | ICD-10-CM | POA: Diagnosis present

## 2017-09-01 HISTORY — DX: Nonrheumatic aortic (valve) stenosis: I35.0

## 2017-09-01 LAB — LIPID PANEL
CHOL/HDL RATIO: 3.1 ratio
CHOLESTEROL: 102 mg/dL (ref 0–200)
Cholesterol: 111 mg/dL (ref 0–200)
HDL: 35 mg/dL — AB (ref >40)
HDL: 36 mg/dL — AB (ref >40)
LDL CALC: 56 mg/dL (ref 0–99)
LDL CALC: 62 mg/dL (ref 0–99)
TRIGLYCERIDES: 53 mg/dL (ref <150)
TRIGLYCERIDES: 67 mg/dL (ref <150)
Total CHOL/HDL Ratio: 2.9 RATIO
VLDL: 11 mg/dL (ref 0–40)
VLDL: 13 mg/dL (ref 0–40)

## 2017-09-01 LAB — CBC
HCT: 25 % — ABNORMAL LOW (ref 40.0–52.0)
HEMOGLOBIN: 8.1 g/dL — AB (ref 13.0–18.0)
MCH: 34.1 pg — AB (ref 26.0–34.0)
MCHC: 32.4 g/dL (ref 32.0–36.0)
MCV: 105.2 fL — ABNORMAL HIGH (ref 80.0–100.0)
Platelets: 80 10*3/uL — ABNORMAL LOW (ref 150–440)
RBC: 2.37 MIL/uL — ABNORMAL LOW (ref 4.40–5.90)
RDW: 18.4 % — AB (ref 11.5–14.5)
WBC: 1.7 10*3/uL — ABNORMAL LOW (ref 3.8–10.6)

## 2017-09-01 LAB — BASIC METABOLIC PANEL
ANION GAP: 7 (ref 5–15)
BUN: 26 mg/dL — ABNORMAL HIGH (ref 6–20)
CO2: 23 mmol/L (ref 22–32)
Calcium: 8.8 mg/dL — ABNORMAL LOW (ref 8.9–10.3)
Chloride: 108 mmol/L (ref 101–111)
Creatinine, Ser: 0.82 mg/dL (ref 0.61–1.24)
GFR calc non Af Amer: >60 mL/min (ref >60)
Glucose, Bld: 105 mg/dL — ABNORMAL HIGH (ref 65–99)
Potassium: 4.4 mmol/L (ref 3.5–5.1)
SODIUM: 138 mmol/L (ref 135–145)

## 2017-09-01 LAB — COMPREHENSIVE METABOLIC PANEL
ALBUMIN: 3.3 g/dL — AB (ref 3.5–5.0)
ALT: 13 U/L — ABNORMAL LOW (ref 17–63)
ANION GAP: 7 (ref 5–15)
AST: 21 U/L (ref 15–41)
Alkaline Phosphatase: 72 U/L (ref 38–126)
BILIRUBIN TOTAL: 0.4 mg/dL (ref 0.3–1.2)
BUN: 29 mg/dL — ABNORMAL HIGH (ref 6–20)
CO2: 23 mmol/L (ref 22–32)
Calcium: 9 mg/dL (ref 8.9–10.3)
Chloride: 107 mmol/L (ref 101–111)
Creatinine, Ser: 0.94 mg/dL (ref 0.61–1.24)
GFR calc Af Amer: >60 mL/min (ref >60)
GFR calc non Af Amer: >60 mL/min (ref >60)
GLUCOSE: 126 mg/dL — AB (ref 65–99)
Potassium: 4.2 mmol/L (ref 3.5–5.1)
SODIUM: 137 mmol/L (ref 135–145)
TOTAL PROTEIN: 6.8 g/dL (ref 6.5–8.1)

## 2017-09-01 LAB — PROTIME-INR
INR: 1.27
PROTHROMBIN TIME: 15.8 s — AB (ref 11.4–15.2)

## 2017-09-01 LAB — CBC WITH DIFFERENTIAL/PLATELET
BASOS ABS: 0 10*3/uL (ref 0–0.1)
Basophils Relative: 1 %
EOS ABS: 0 10*3/uL (ref 0–0.7)
Eosinophils Relative: 1 %
HEMATOCRIT: 25.3 % — AB (ref 40.0–52.0)
HEMOGLOBIN: 8.3 g/dL — AB (ref 13.0–18.0)
Lymphocytes Relative: 54 %
Lymphs Abs: 1.4 10*3/uL (ref 1.0–3.6)
MCH: 34.4 pg — ABNORMAL HIGH (ref 26.0–34.0)
MCHC: 32.9 g/dL (ref 32.0–36.0)
MCV: 104.5 fL — ABNORMAL HIGH (ref 80.0–100.0)
Monocytes Absolute: 0.5 10*3/uL (ref 0.2–1.0)
Monocytes Relative: 20 %
NEUTROS ABS: 0.6 10*3/uL — AB (ref 1.4–6.5)
NEUTROS PCT: 24 %
Platelets: 93 10*3/uL — ABNORMAL LOW (ref 150–440)
RBC: 2.42 MIL/uL — AB (ref 4.40–5.90)
RDW: 18.3 % — ABNORMAL HIGH (ref 11.5–14.5)
WBC: 2.6 10*3/uL — AB (ref 3.8–10.6)

## 2017-09-01 LAB — APTT: aPTT: 31 seconds (ref 24–36)

## 2017-09-01 LAB — TROPONIN I
Troponin I: 0.03 ng/mL (ref <0.03)
Troponin I: 0.03 ng/mL (ref <0.03)
Troponin I: 0.04 ng/mL (ref <0.03)
Troponin I: 0.04 ng/mL (ref <0.03)

## 2017-09-01 LAB — PREPARE RBC (CROSSMATCH)

## 2017-09-01 LAB — BRAIN NATRIURETIC PEPTIDE: B NATRIURETIC PEPTIDE 5: 183 pg/mL — AB (ref 0.0–100.0)

## 2017-09-01 LAB — MRSA PCR SCREENING: MRSA BY PCR: NEGATIVE

## 2017-09-01 MED ORDER — SODIUM CHLORIDE 0.9% FLUSH
3.0000 mL | Freq: Two times a day (BID) | INTRAVENOUS | Status: DC
  Administered 2017-09-01 – 2017-09-02 (×4): 3 mL via INTRAVENOUS

## 2017-09-01 MED ORDER — MORPHINE SULFATE (PF) 2 MG/ML IV SOLN
1.0000 mg | Freq: Once | INTRAVENOUS | Status: AC
  Administered 2017-09-01: 1 mg via INTRAVENOUS
  Filled 2017-09-01: qty 1

## 2017-09-01 MED ORDER — ACETAMINOPHEN 325 MG PO TABS: 650.0000 mg | ORAL_TABLET | ORAL | Status: DC | PRN

## 2017-09-01 MED ORDER — HEPARIN SODIUM (PORCINE) 5000 UNIT/ML IJ SOLN
5000.0000 [IU] | Freq: Three times a day (TID) | INTRAMUSCULAR | Status: DC
  Administered 2017-09-01 – 2017-09-02 (×3): 5000 [IU] via SUBCUTANEOUS
  Filled 2017-09-01 (×3): qty 1

## 2017-09-01 MED ORDER — ONDANSETRON HCL 4 MG/2ML IJ SOLN
4.0000 mg | Freq: Once | INTRAMUSCULAR | Status: AC
  Administered 2017-09-01: 4 mg via INTRAVENOUS

## 2017-09-01 MED ORDER — ISOSORBIDE MONONITRATE ER 30 MG PO TB24
30.0000 mg | ORAL_TABLET | Freq: Every day | ORAL | Status: DC
  Administered 2017-09-01 – 2017-09-02 (×2): 30 mg via ORAL
  Filled 2017-09-01 (×2): qty 1

## 2017-09-01 MED ORDER — SODIUM CHLORIDE 0.9 % IV SOLN
INTRAVENOUS | Status: DC
  Administered 2017-09-01: 20 mL/h via INTRAVENOUS

## 2017-09-01 MED ORDER — LISINOPRIL 20 MG PO TABS
20.0000 mg | ORAL_TABLET | Freq: Every day | ORAL | Status: DC
  Administered 2017-09-01: 20 mg via ORAL
  Filled 2017-09-01: qty 1

## 2017-09-01 MED ORDER — ASPIRIN 81 MG PO CHEW: 324.0000 mg | CHEWABLE_TABLET | ORAL | Status: AC

## 2017-09-01 MED ORDER — ASPIRIN 300 MG RE SUPP: 300.0000 mg | RECTAL | Status: AC

## 2017-09-01 MED ORDER — MORPHINE SULFATE (PF) 2 MG/ML IV SOLN
INTRAVENOUS | Status: AC
  Filled 2017-09-01: qty 1

## 2017-09-01 MED ORDER — NITROGLYCERIN 0.4 MG SL SUBL
0.4000 mg | SUBLINGUAL_TABLET | SUBLINGUAL | Status: DC | PRN
  Administered 2017-09-01: 0.4 mg via SUBLINGUAL
  Filled 2017-09-01: qty 1

## 2017-09-01 MED ORDER — LISINOPRIL 10 MG PO TABS
10.0000 mg | ORAL_TABLET | Freq: Every day | ORAL | Status: DC
  Administered 2017-09-02: 10 mg via ORAL
  Filled 2017-09-01: qty 1

## 2017-09-01 MED ORDER — BISACODYL 5 MG PO TBEC
5.0000 mg | DELAYED_RELEASE_TABLET | Freq: Every day | ORAL | Status: DC | PRN
  Administered 2017-09-01: 5 mg via ORAL
  Filled 2017-09-01: qty 1

## 2017-09-01 MED ORDER — CLOPIDOGREL BISULFATE 75 MG PO TABS
75.0000 mg | ORAL_TABLET | Freq: Every day | ORAL | Status: DC
  Administered 2017-09-01 – 2017-09-02 (×2): 75 mg via ORAL
  Filled 2017-09-01 (×2): qty 1

## 2017-09-01 MED ORDER — TRIAMCINOLONE ACETONIDE 0.1 % EX CREA
1.0000 "application " | TOPICAL_CREAM | Freq: Two times a day (BID) | CUTANEOUS | Status: DC
  Administered 2017-09-01 – 2017-09-02 (×3): 1 via TOPICAL
  Filled 2017-09-01: qty 15

## 2017-09-01 MED ORDER — SODIUM CHLORIDE 0.9% FLUSH: 3.0000 mL | INTRAVENOUS | Status: DC | PRN

## 2017-09-01 MED ORDER — ALBUTEROL SULFATE (2.5 MG/3ML) 0.083% IN NEBU: 2.5000 mg | INHALATION_SOLUTION | Freq: Four times a day (QID) | RESPIRATORY_TRACT | Status: DC | PRN

## 2017-09-01 MED ORDER — NITROGLYCERIN 0.4 MG SL SUBL: 0.4000 mg | SUBLINGUAL_TABLET | SUBLINGUAL | Status: DC | PRN

## 2017-09-01 MED ORDER — ASPIRIN EC 81 MG PO TBEC
81.0000 mg | DELAYED_RELEASE_TABLET | Freq: Every day | ORAL | Status: DC
  Administered 2017-09-02: 81 mg via ORAL
  Filled 2017-09-01: qty 1

## 2017-09-01 MED ORDER — ADULT MULTIVITAMIN W/MINERALS CH
1.0000 | ORAL_TABLET | Freq: Every day | ORAL | Status: DC
  Administered 2017-09-01 – 2017-09-02 (×2): 1 via ORAL
  Filled 2017-09-01 (×2): qty 1

## 2017-09-01 MED ORDER — SIMVASTATIN 20 MG PO TABS
10.0000 mg | ORAL_TABLET | Freq: Every day | ORAL | Status: DC
  Administered 2017-09-01: 10 mg via ORAL
  Filled 2017-09-01: qty 1

## 2017-09-01 MED ORDER — SODIUM CHLORIDE 0.9 % IV SOLN: 250.0000 mL | INTRAVENOUS | Status: DC | PRN

## 2017-09-01 MED ORDER — ONDANSETRON HCL 4 MG/2ML IJ SOLN
INTRAMUSCULAR | Status: AC
  Filled 2017-09-01: qty 2

## 2017-09-01 MED ORDER — SODIUM CHLORIDE 0.9 % IV SOLN
Freq: Once | INTRAVENOUS | Status: AC
  Administered 2017-09-01: 11:00:00 via INTRAVENOUS

## 2017-09-01 MED ORDER — HEPARIN SODIUM (PORCINE) 5000 UNIT/ML IJ SOLN
4000.0000 [IU] | Freq: Once | INTRAMUSCULAR | Status: AC
  Administered 2017-09-01: 4000 [IU] via INTRAVENOUS

## 2017-09-01 MED ORDER — ASPIRIN EC 81 MG PO TBEC: 81.0000 mg | DELAYED_RELEASE_TABLET | Freq: Every day | ORAL | Status: DC

## 2017-09-01 MED ORDER — ONDANSETRON HCL 4 MG/2ML IJ SOLN: 4.0000 mg | Freq: Four times a day (QID) | INTRAMUSCULAR | Status: DC | PRN

## 2017-09-01 MED ORDER — HEPARIN SODIUM (PORCINE) 5000 UNIT/ML IJ SOLN: 5000.0000 [IU] | Freq: Three times a day (TID) | INTRAMUSCULAR | Status: DC

## 2017-09-01 MED ORDER — MORPHINE SULFATE (PF) 2 MG/ML IV SOLN
2.0000 mg | Freq: Once | INTRAVENOUS | Status: AC
  Administered 2017-09-01: 2 mg via INTRAVENOUS

## 2017-09-01 MED ORDER — METOPROLOL SUCCINATE ER 25 MG PO TB24
12.5000 mg | ORAL_TABLET | Freq: Every day | ORAL | Status: DC
  Administered 2017-09-01 – 2017-09-02 (×2): 12.5 mg via ORAL
  Filled 2017-09-01 (×2): qty 1

## 2017-09-01 NOTE — Plan of Care (Addendum)
Pt received one unit of blood without complications. Pt resting in bed comfortably, no more complains of cp. Will continue to monitor.

## 2017-09-01 NOTE — Progress Notes (Signed)
Saw Terry Gonzales today for Terry Gonzales coronary artery disease.  Is continued to have on and off chest pain.  Pain was relieved last night with nitroglycerin.  It is likely that Terry Gonzales Terry Gonzales need long-acting nitroglycerin to help with Terry Gonzales pain.  We Altha Sweitzer plan to start Imdur today.  Should Terry Gonzales not have chest pain throughout the day, may plan to ambulate either this evening or tomorrow morning to see if Terry Gonzales pain is improved.  If it has improved, no further cardiac workup is necessary.  Khayla Koppenhaver Curt Bears, MD 09/01/2017 12:00 PM

## 2017-09-01 NOTE — Progress Notes (Signed)
Pt still complaining of 4 out 10 chest pain after receiving 1 sublingual nitro. Per patient pain does not radiate or has changed. Dr. Fritzi Mandes notified and received orders for 1 mg morphine. Will administer and continue to monitor.

## 2017-09-01 NOTE — ED Triage Notes (Signed)
Pt arrives to ED via [facility] from home with c/o chest pain and SHOB that started 15-74mins PTA. Pt self-adminstered 3 SL Nitro tablets prior to EMS arrival. EMS reports giving 1 dose of intranasal Nitro spray and 324mg  ASA en route. Pt reported LEFT-sided CP with radiation into the neck and LEFT shoulder. Pt with previous cardiac h/x including 2 previous MIs. Dr Dahlia Client at bedside upon pt's arrival to ED and CODE STEMI activated PTA.

## 2017-09-01 NOTE — ED Provider Notes (Signed)
[facility] Department Provider Note   ____________________________________________   First MD Initiated Contact with Patient 09/01/17 0011     (approximate)  I have reviewed the triage vital signs and the nursing notes.   HISTORY  Chief Complaint Chest Pain    HPI Rayne Loiseau is a 84 y.o. male who comes into the hospital today with some left-sided chest pain and shortness of breath.  The patient's pain started right before midnight.  EMS said that when he was laying down the pain was okay but when they set him up the pain was worse.  He received some aspirin as well as some nitroglycerin.  He took some at home and then received a spray from EMS.  EMS states that his initial EKG looked good but when they repeated it they were concerned about ST segment elevation in leads V3 and V4 so they activated a STEMI.  The patient states that when the pain got worse it started radiating to his neck and his left shoulder.  He denies any nausea or vomiting.  The patient states that his pain is gone at this time.  The patient is here today for evaluation of his symptoms.  Past Medical History:  Diagnosis Date  . Diabetes mellitus without complication ([place])   . Hypertension   . Myelodysplastic syndrome [facility])     Patient Active Problem List   Diagnosis Date Noted  . MDS (myelodysplastic syndrome), low grade ([place]) 08/20/2017  . NSTEMI (non-ST elevated myocardial infarction) ([place])   . Acute combined systolic and diastolic heart failure ([place])   . Severe aortic stenosis   . Pneumonia 08/14/2017    Past Surgical History:  Procedure Laterality Date  . LEFT HEART CATH AND CORONARY ANGIOGRAPHY N/A 08/16/2017   Procedure: LEFT HEART CATH AND CORONARY ANGIOGRAPHY;  Surgeon: Minna Merritts, MD;  Location: [facility];  Service: Cardiovascular;  Laterality: N/A;    Prior to Admission medications   Medication Sig Start Date End Date Taking? Authorizing  Provider  albuterol (PROVENTIL HFA;VENTOLIN HFA) 108 (90 Base) MCG/ACT inhaler Inhale 2 puffs into the lungs every 6 (six) hours as needed for wheezing or shortness of breath. 08/19/17  Yes Vaughan Basta, MD  aspirin EC 81 MG EC tablet Take 1 tablet (81 mg total) by mouth daily. 08/20/17  Yes Vaughan Basta, MD  bisacodyl (DULCOLAX) 5 MG EC tablet Take 1 tablet (5 mg total) by mouth daily as needed for moderate constipation. 08/19/17  Yes Vaughan Basta, MD  clopidogrel (PLAVIX) 75 MG tablet Take 1 tablet (75 mg total) by mouth daily. 08/20/17  Yes Vaughan Basta, MD  Darbepoetin Alfa (ARANESP) 500 MCG/ML SOSY injection Inject 500 mcg into the skin every 30 (thirty) days.   Yes [provider]  lisinopril (PRINIVIL,ZESTRIL) 20 MG tablet Take 20 mg by mouth daily.   Yes [provider]  metoprolol succinate (TOPROL-XL) 25 MG 24 hr tablet Take 0.5 tablets (12.5 mg total) by mouth daily. 08/20/17  Yes Vaughan Basta, MD  Multiple Vitamin (MULTI-VITAMINS) TABS Take 1 tablet by mouth daily. 02/24/03  Yes [provider]  nitroGLYCERIN (NITROSTAT) 0.4 MG SL tablet Place 1 tablet (0.4 mg total) under the tongue every 5 (five) minutes as needed for chest pain. 08/29/17  Yes Gollan, Kathlene November, MD  simvastatin (ZOCOR) 10 MG tablet Take 10 mg by mouth daily. 05/06/17  Yes [provider]  triamcinolone cream (KENALOG) 0.1 % Apply 1 application topically 2 (two) times  daily.   Yes [provider]    Allergies Penicillins  Family History  Problem Relation Age of Onset  . Hypertension Father     Social History Social History   Tobacco Use  . Smoking status: Former Smoker    Packs/day: 1.00    Types: Cigarettes    Last attempt to quit: 08/13/1968    Years since quitting: 49.0  . Smokeless tobacco: Never Used  Substance Use Topics  . Alcohol use: No    Frequency: Never  . Drug use: Not on file    Review of  Systems  Constitutional: No fever/chills Eyes: No visual changes. ENT: No sore throat. Cardiovascular:  chest pain. Respiratory: shortness of breath. Gastrointestinal: No abdominal pain.  No nausea, no vomiting.  No diarrhea.  No constipation. Genitourinary: Negative for dysuria. Musculoskeletal: Negative for back pain. Skin: Negative for rash. Neurological: Negative for headaches, focal weakness or numbness.   ____________________________________________   PHYSICAL EXAM:  VITAL SIGNS: ED Triage Vitals  Enc Vitals Group     BP 09/01/17 0019 (!) 106/51     Pulse Rate 09/01/17 0019 63     Resp 09/01/17 0019 18     Temp 09/01/17 0019 (!) 97.4 F (36.3 C)     Temp Source 09/01/17 0019 Oral     SpO2 09/01/17 0019 100 %     Weight 09/01/17 0019 155 lb (70.3 kg)     Height 09/01/17 0019 6' (1.829 m)     Head Circumference --      Peak Flow --      Pain Score 09/01/17 0018 0     Pain Loc --      Pain Edu? --      Excl. in [place]? --     Constitutional: Alert and oriented. Well appearing and in no acute distress. Eyes: Conjunctivae are normal. PERRL. EOMI. Head: Atraumatic. Nose: No congestion/rhinnorhea. Mouth/Throat: Mucous membranes are moist.  Oropharynx non-erythematous. Cardiovascular: Normal rate, regular rhythm. Systolic murmur.  Good peripheral circulation. Respiratory: Normal respiratory effort.  No retractions. Lungs CTAB. Gastrointestinal: Soft and nontender. No distention. Positive bowel sounds Musculoskeletal: No lower extremity tenderness nor edema.   Neurologic:  Normal speech and language.  Skin:  Skin is warm, dry and intact. Psychiatric: Mood and affect are normal.   ____________________________________________   LABS (all labs ordered are listed, but only abnormal results are displayed)  Labs Reviewed  CBC WITH DIFFERENTIAL/PLATELET - Abnormal; Notable for the following components:      Result Value   WBC 2.6 (*)    RBC 2.42 (*)    Hemoglobin 8.3  (*)    HCT 25.3 (*)    MCV 104.5 (*)    MCH 34.4 (*)    RDW 18.3 (*)    Platelets 93 (*)    Neutro Abs 0.6 (*)    All other components within normal limits  PROTIME-INR - Abnormal; Notable for the following components:   Prothrombin Time 15.8 (*)    All other components within normal limits  COMPREHENSIVE METABOLIC PANEL - Abnormal; Notable for the following components:   Glucose, Bld 126 (*)    BUN 29 (*)    Albumin 3.3 (*)    ALT 13 (*)    All other components within normal limits  TROPONIN I - Abnormal; Notable for the following components:   Troponin I 0.04 (*)    All other components within normal limits  LIPID PANEL - Abnormal; Notable for the following components:  HDL 36 (*)    All other components within normal limits  BRAIN NATRIURETIC PEPTIDE - Abnormal; Notable for the following components:   B Natriuretic Peptide 183.0 (*)    All other components within normal limits  APTT  CBC  TYPE AND SCREEN   ____________________________________________  EKG  ED ECG REPORT I, Loney Hering, the attending physician, personally viewed and interpreted this ECG.   Date: 09/01/2017  EKG Time: 0015  Rate: 59  Rhythm: normal sinus rhythm  Axis: normal  Intervals:incomplete left bundle branch block  ST&T Change: ST segment elevation in lead V3 and V4 with t wave inversion lead II, III, avf  ____________________________________________  RADIOLOGY  Dg Chest Portable 1 View  Result Date: 09/01/2017 CLINICAL DATA:  Chest pain and shortness of breath EXAM: PORTABLE CHEST 1 VIEW COMPARISON:  08/24/2017 FINDINGS: There is mild normal heart size. No pleural effusion or edema. Calcified granulomas within the left upper lobe. No airspace consolidation. IMPRESSION: No active disease. Electronically Signed   By: Kerby Moors M.D.   On: 09/01/2017 00:58    ____________________________________________   PROCEDURES  Procedure(s) performed: None  Procedures  Critical  Care performed: No  ____________________________________________   INITIAL IMPRESSION / ASSESSMENT AND PLAN / ED COURSE  As part of my medical decision making, I reviewed the following data within the electronic MEDICAL RECORD NUMBER Notes from prior ED visits and [place] Controlled Substance Database   This is a 84 year old male who comes into the hospital today after having a STEMI activated with some left-sided chest pain and shortness of breath.  I looked at the patient's EKG which did have some elevation in lead V3 and V4 but the patient had a similar EKG on January 4.  Looking back at the patient's notes he had a catheterization done on January 4 which showed three-vessel disease.  The decision was made not to proceed to CABG given the patient's age but to medically manage his symptoms.  The patient also has some moderate aortic stenosis and some heart failure with an EF of 30-35%.  The patient's pain is controlled at this time.  The cardiologist Dr. Saunders Revel did come and he is speaking with the patient's family and is at their bedside.  Patient did receive a dose of heparin for the STEMI when he arrived.  We will check some blood work and we will confer with Dr. Saunders Revel to determine the patient's disposition.     Patient's hemoglobin is 8.3 and his hematocrit is 25.3.  I discussed the case with the cardiologist on-call and he recommends transfusing the patient as he might have pain due to his anemia.  The patient will be admitted to the hospitalist service for further evaluation and treatment of his chest pain. ____________________________________________   FINAL CLINICAL IMPRESSION(S) / ED DIAGNOSES  Final diagnoses:  Unstable angina [facility])     ED Discharge Orders    None       Note:  This document was prepared using Dragon voice recognition software and may include unintentional dictation errors.    Loney Hering, MD 09/01/17 [phone]

## 2017-09-01 NOTE — Plan of Care (Signed)
  Progressing Education: Knowledge of General Education information will improve 09/01/2017 0453 - Progressing by Jeri Cos, RN Health Behavior/Discharge Planning: Ability to manage health-related needs will improve 09/01/2017 0453 - Progressing by Jeri Cos, RN Clinical Measurements: Ability to maintain clinical measurements within normal limits will improve 09/01/2017 0453 - Progressing by Jeri Cos, RN Diagnostic test results will improve 09/01/2017 0453 - Progressing by Jeri Cos, RN Cardiovascular complication will be avoided 09/01/2017 0453 - Progressing by Jeri Cos, RN Activity: Risk for activity intolerance will decrease 09/01/2017 0453 - Progressing by Jeri Cos, RN Skin Integrity: Risk for impaired skin integrity will decrease 09/01/2017 0453 - Progressing by Jeri Cos, RN Activity: Ability to tolerate increased activity will improve 09/01/2017 0453 - Progressing by Jeri Cos, RN Cardiac: Ability to achieve and maintain adequate cardiovascular perfusion will improve 09/01/2017 0453 - Progressing by Jeri Cos, RN

## 2017-09-01 NOTE — Progress Notes (Signed)
Notified by CCMD of 5 bts of SVT. In to check on pt. Pt resting with eyes closed. Will continue to monitor and assess.

## 2017-09-01 NOTE — H&P (Signed)
[facility] [place] NAME: Terry Gonzales    MR#:  [id number]  DATE OF BIRTH:  1924/09/24  DATE OF ADMISSION:  09/01/2017  PRIMARY CARE PHYSICIAN: Zolotor, Audie Clear, MD   REQUESTING/REFERRING PHYSICIAN:   CHIEF COMPLAINT:   Chief Complaint  Patient presents with  . Chest Pain    HISTORY OF PRESENT ILLNESS: Terry Gonzales  is a 84 y.o. male with a known history of myelodysplastic syndrome coronary artery disease, hypertension, diabetes mellitus, aortic stenosis presented to the emergency room with chest pain.  The chest pain started around 11:30 PM last night and was severe located in the left side of the chest 10 out of 10 initially.  Patient's wife called the EMS and patient was evaluated and brought to the emergency room.  Initially the EMS thought the EKG showed ST elevation MI and patient was examined and evaluated by cardiology on call in the emergency room. STEMI was ruled out and medical management was recommended for angina.  First set of troponin is 0.04.  Patient recently had cardiac catheterization in January which showed three-vessel disease.  Coronary artery bypass surgery was recommended but patient and family refused the procedure in view of her advanced age and multiple comorbidities.  Cardiology today advised to continue aspirin, Plavix and PRBC transfusion for demand ischemia if the troponin rises or patient continues to have chest pain.  No complaints of any shortness of breath, orthopnea.  No fever chills and cough.  PAST MEDICAL HISTORY:   Past Medical History:  Diagnosis Date  . Aortic stenosis   . Diabetes mellitus without complication ([place])   . Hypertension   . Myelodysplastic syndrome ([place])     PAST SURGICAL HISTORY:  Past Surgical History:  Procedure Laterality Date  . LEFT HEART CATH AND CORONARY ANGIOGRAPHY N/A 08/16/2017   Procedure: LEFT HEART CATH AND CORONARY ANGIOGRAPHY;  Surgeon: Minna Merritts, MD;   Location: [facility];  Service: Cardiovascular;  Laterality: N/A;    SOCIAL HISTORY:  Social History   Tobacco Use  . Smoking status: Former Smoker    Packs/day: 1.00    Types: Cigarettes    Last attempt to quit: 08/13/1968    Years since quitting: 49.0  . Smokeless tobacco: Never Used  Substance Use Topics  . Alcohol use: No    Frequency: Never    FAMILY HISTORY:  Family History  Problem Relation Age of Onset  . Hypertension Father     DRUG ALLERGIES:  Allergies  Allergen Reactions  . Penicillins Swelling    Has patient had a PCN reaction causing immediate rash, facial/tongue/throat swelling, SOB or lightheadedness with hypotension: Yes Has patient had a PCN reaction causing severe rash involving mucus membranes or skin necrosis: No Has patient had a PCN reaction that required hospitalization: No Has patient had a PCN reaction occurring within the last 10 years: No If all of the above answers are "NO", then may proceed with Cephalosporin use.    REVIEW OF SYSTEMS:   CONSTITUTIONAL: No fever, fatigue or weakness.  EYES: No blurred or double vision.  EARS, NOSE, AND THROAT: No tinnitus or ear pain.  RESPIRATORY: No cough, shortness of breath, wheezing or hemoptysis.  CARDIOVASCULAR: Has chest pain,  No orthopnea, edema.  GASTROINTESTINAL: No nausea, vomiting, diarrhea or abdominal pain.  GENITOURINARY: No dysuria, hematuria.  ENDOCRINE: No polyuria, nocturia,  HEMATOLOGY: Has anemia, No  easy bruising or bleeding SKIN: No rash or lesion. MUSCULOSKELETAL:  No joint pain or arthritis.   NEUROLOGIC: No tingling, numbness, weakness.  PSYCHIATRY: No anxiety or depression.   MEDICATIONS AT HOME:  Prior to Admission medications   Medication Sig Start Date End Date Taking? Authorizing Provider  albuterol (PROVENTIL HFA;VENTOLIN HFA) 108 (90 Base) MCG/ACT inhaler Inhale 2 puffs into the lungs every 6 (six) hours as needed for wheezing or shortness of breath.  08/19/17  Yes Vaughan Basta, MD  aspirin EC 81 MG EC tablet Take 1 tablet (81 mg total) by mouth daily. 08/20/17  Yes Vaughan Basta, MD  bisacodyl (DULCOLAX) 5 MG EC tablet Take 1 tablet (5 mg total) by mouth daily as needed for moderate constipation. 08/19/17  Yes Vaughan Basta, MD  clopidogrel (PLAVIX) 75 MG tablet Take 1 tablet (75 mg total) by mouth daily. 08/20/17  Yes Vaughan Basta, MD  Darbepoetin Alfa (ARANESP) 500 MCG/ML SOSY injection Inject 500 mcg into the skin every 30 (thirty) days.   Yes [provider]  lisinopril (PRINIVIL,ZESTRIL) 20 MG tablet Take 20 mg by mouth daily.   Yes [provider]  metoprolol succinate (TOPROL-XL) 25 MG 24 hr tablet Take 0.5 tablets (12.5 mg total) by mouth daily. 08/20/17  Yes Vaughan Basta, MD  Multiple Vitamin (MULTI-VITAMINS) TABS Take 1 tablet by mouth daily. 02/24/03  Yes [provider]  nitroGLYCERIN (NITROSTAT) 0.4 MG SL tablet Place 1 tablet (0.4 mg total) under the tongue every 5 (five) minutes as needed for chest pain. 08/29/17  Yes Gollan, Kathlene November, MD  simvastatin (ZOCOR) 10 MG tablet Take 10 mg by mouth daily. 05/06/17  Yes [provider]  triamcinolone cream (KENALOG) 0.1 % Apply 1 application topically 2 (two) times daily.   Yes [provider]      PHYSICAL EXAMINATION:   VITAL SIGNS: Blood pressure (!) 109/45, pulse (!) 55, temperature (!) 97.4 F (36.3 C), temperature source Oral, resp. rate 18, height 6' (1.829 m), weight 70.3 kg (155 lb), SpO2 100 %.  GENERAL:  84 y.o.-year-old elderly patient lying in the bed with no acute distress.  EYES: Pupils equal, round, reactive to light and accommodation. No scleral icterus. Extraocular muscles intact. Pallor present. HEENT: Head atraumatic, normocephalic. Oropharynx and nasopharynx clear.  NECK:  Supple, no jugular venous distention. No thyroid enlargement, no tenderness.  LUNGS: Normal breath sounds  bilaterally, no wheezing, rales,rhonchi or crepitation. No use of accessory muscles of respiration.  CARDIOVASCULAR: S1, S2 normal. No murmurs, rubs, or gallops.  ABDOMEN: Soft, nontender, nondistended. Bowel sounds present. No organomegaly or mass.  EXTREMITIES: No pedal edema, cyanosis, or clubbing.  NEUROLOGIC: Cranial nerves II through XII are intact. Muscle strength 5/5 in all extremities. Sensation intact. Gait not checked.  PSYCHIATRIC: The patient is alert and oriented x 3.  SKIN: No obvious rash, lesion, or ulcer.   LABORATORY PANEL:   CBC Recent Labs  Lab 09/01/17 0017  WBC 2.6*  HGB 8.3*  HCT 25.3*  PLT 93*  MCV 104.5*  MCH 34.4*  MCHC 32.9  RDW 18.3*  LYMPHSABS 1.4  MONOABS 0.5  EOSABS 0.0  BASOSABS 0.0   ------------------------------------------------------------------------------------------------------------------  Chemistries  Recent Labs  Lab 09/01/17 0017  NA 137  K 4.2  CL 107  CO2 23  GLUCOSE 126*  BUN 29*  CREATININE 0.94  CALCIUM 9.0  AST 21  ALT 13*  ALKPHOS 72  BILITOT 0.4   ------------------------------------------------------------------------------------------------------------------ estimated creatinine clearance is 51.9 mL/min (by C-G formula based on SCr of 0.94 mg/dL). ------------------------------------------------------------------------------------------------------------------ No results for input(s):  TSH, T4TOTAL, T3FREE, THYROIDAB in the last 72 hours.  Invalid input(s): FREET3   Coagulation profile Recent Labs  Lab 09/01/17 0017  INR 1.27   ------------------------------------------------------------------------------------------------------------------- No results for input(s): DDIMER in the last 72 hours. -------------------------------------------------------------------------------------------------------------------  Cardiac Enzymes Recent Labs  Lab 09/01/17 0017  TROPONINI 0.04*    ------------------------------------------------------------------------------------------------------------------ Invalid input(s): POCBNP  ---------------------------------------------------------------------------------------------------------------  Urinalysis    Component Value Date/Time   COLORURINE YELLOW (A) 08/24/2017 1329   APPEARANCEUR CLEAR (A) 08/24/2017 1329   APPEARANCEUR Clear 05/27/2014 1708   LABSPEC 1.012 08/24/2017 1329   LABSPEC 1.009 05/27/2014 1708   PHURINE 6.0 08/24/2017 1329   GLUCOSEU NEGATIVE 08/24/2017 1329   GLUCOSEU Negative 05/27/2014 1708   HGBUR MODERATE (A) 08/24/2017 1329   BILIRUBINUR NEGATIVE 08/24/2017 1329   BILIRUBINUR Negative 05/27/2014 1708   KETONESUR NEGATIVE 08/24/2017 1329   PROTEINUR NEGATIVE 08/24/2017 1329   NITRITE NEGATIVE 08/24/2017 1329   LEUKOCYTESUR NEGATIVE 08/24/2017 1329   LEUKOCYTESUR Negative 05/27/2014 1708     RADIOLOGY: Dg Chest Portable 1 View  Result Date: 09/01/2017 CLINICAL DATA:  Chest pain and shortness of breath EXAM: PORTABLE CHEST 1 VIEW COMPARISON:  08/24/2017 FINDINGS: There is mild normal heart size. No pleural effusion or edema. Calcified granulomas within the left upper lobe. No airspace consolidation. IMPRESSION: No active disease. Electronically Signed   By: Kerby Moors M.D.   On: 09/01/2017 00:58    EKG: Orders placed or performed during the hospital encounter of 09/01/17  . ED EKG  . ED EKG    IMPRESSION AND PLAN: 84 year old male patient with history of coronary artery disease, myelodysplastic syndrome, anemia, aortic stenosis, hypertension presented to the emergency room with chest pain.  Admitting diagnosis 1.  Unstable angina 2.  Coronary artery disease 3.  Myelodysplastic syndrome 4.  Anemia 5.  Aortic stenosis Treatment plan Status post cardiology evaluation in the emergency room Medical management for angina no acute intervention recommended Continue aspirin, Plavix,  statin medication and beta-blocker DVT prophylaxis subcu heparin PRBC transfusion IV if hemoglobin drops for demand ischemia PRN morphine for chest pain Check echocardiogram  All the records are reviewed and case discussed with ED provider. Management plans discussed with the patient, family and they are in agreement.  CODE STATUS:DNR    Code Status Orders  (From admission, onward)        Start     Ordered   09/01/17 0117  Do not attempt resuscitation (DNR)  Continuous    Question Answer Comment  In the event of cardiac or respiratory ARREST Do not call a "code blue"   In the event of cardiac or respiratory ARREST Do not perform Intubation, CPR, defibrillation or ACLS   In the event of cardiac or respiratory ARREST Use medication by any route, position, wound care, and other measures to relive pain and suffering. May use oxygen, suction and manual treatment of airway obstruction as needed for comfort.      09/01/17 0116    Code Status History    Date Active Date Inactive Code Status Order ID Comments User Context   08/14/2017 22:12 08/19/2017 20:55 Full Code [id number]  Amelia Jo, MD Inpatient       TOTAL TIME TAKING CARE OF THIS PATIENT: 52 minutes.    Saundra Shelling M.D on 09/01/2017 at 2:31 AM  Between 7am to 6pm - Pager - [phone]  After 6pm go to [email or web address] - password [email or web address] [facility]  [facility] [place] [facility]  Office  [phone]  CC: Primary care physician; Zolotor,  Audie Clear, MD

## 2017-09-01 NOTE — Consult Note (Signed)
Cardiology Consultation:   Patient ID: Terry Gonzales; [id number]; Dec 12, 1924   Admit date: 09/01/2017 Date of Consult: 09/01/2017  Primary Care Provider: Physicians, [facility] Primary Cardiologist: Esmond Plants, MD PhD Primary Electrophysiologist:  None   Patient Profile:   Terry Gonzales is a 84 y.o. male with a hx of severe three-vessel coronary artery disease with recent and STEMI, severe systolic heart failure secondary to ischemic cardiomyopathy, at least moderate aortic stenosis, hypertension, diabetes mellitus, and myelodysplastic syndrome, who is being seen today for the evaluation of chest pain and abnormal EKG at the request of Dr. Dahlia Client.  History of Present Illness:   Terry Gonzales was hospitalized at [facility] from 1/2 through 08/19/17 for pneumonia complicated by NSTEMI and acute kidney injury.  Presented with chest pain and productive cough times 3 days.  Chest radiograph was suspicious for bilateral pneumonia (though in retrospect question component of heart failure), as well as elevated troponin that ultimately peaked at 25.8.  Following discharge, Terry Gonzales remained weak and somewhat short of breath, though he was trying to perform some physical therapy.  He was seen in the office by Dr. Rockey Situ 3 days ago and was noted to be hypotensive.  For some reason he was on both lisinopril and losartan; losartan was discontinued at that time.  This evening around 11:20 PM, Terry Gonzales developed severe sharp left-sided chest pain with accompanying shortness of breath.  His wife gave him 3 sublingual nitroglycerin and then summoned EMS, as his pain did not improve.  He received ant additional dose of nitroglycerin by EMS and is currently chest pain-free.  He still notes mild shortness of breath.  He had been in his usual state of health up until tonight.  He had taken a cough suppressant with codeine earlier in the evening, as he continued to have a cough from his recent respiratory issues.  He  denies edema but has stable chronic orthopnea.  His wife reports that he Terry Gonzales has lost about 5 pounds in the last week.  He has been compliant with his medications.  No significant bleeding has been noted.  Past Medical History:  Diagnosis Date  . Diabetes mellitus without complication ([place])   . Hypertension   . Myelodysplastic syndrome [facility])     Past Surgical History:  Procedure Laterality Date  . LEFT HEART CATH AND CORONARY ANGIOGRAPHY N/A 08/16/2017   Procedure: LEFT HEART CATH AND CORONARY ANGIOGRAPHY;  Surgeon: Minna Merritts, MD;  Location: [facility];  Service: Cardiovascular;  Laterality: N/A;     Home Medications:  Prior to Admission medications   Medication Sig Start Date Jackie Littlejohn Date Taking? Authorizing Provider  albuterol (PROVENTIL HFA;VENTOLIN HFA) 108 (90 Base) MCG/ACT inhaler Inhale 2 puffs into the lungs every 6 (six) hours as needed for wheezing or shortness of breath. 08/19/17  Yes Vaughan Basta, MD  aspirin EC 81 MG EC tablet Take 1 tablet (81 mg total) by mouth daily. 08/20/17  Yes Vaughan Basta, MD  bisacodyl (DULCOLAX) 5 MG EC tablet Take 1 tablet (5 mg total) by mouth daily as needed for moderate constipation. 08/19/17  Yes Vaughan Basta, MD  clopidogrel (PLAVIX) 75 MG tablet Take 1 tablet (75 mg total) by mouth daily. 08/20/17  Yes Vaughan Basta, MD  Darbepoetin Alfa (ARANESP) 500 MCG/ML SOSY injection Inject 500 mcg into the skin every 30 (thirty) days.   Yes [provider]  lisinopril (PRINIVIL,ZESTRIL) 20 MG tablet Take 20 mg by mouth daily.   Yes [provider]  metoprolol succinate (TOPROL-XL) 25 MG 24 hr tablet Take 0.5 tablets (12.5 mg total) by mouth daily. 08/20/17  Yes Vaughan Basta, MD  Multiple Vitamin (MULTI-VITAMINS) TABS Take 1 tablet by mouth daily. 02/24/03  Yes [provider]  nitroGLYCERIN (NITROSTAT) 0.4 MG SL tablet Place 1 tablet (0.4 mg total) under the tongue every  5 (five) minutes as needed for chest pain. 08/29/17  Yes Gollan, Kathlene November, MD  simvastatin (ZOCOR) 10 MG tablet Take 10 mg by mouth daily. 05/06/17  Yes [provider]  triamcinolone cream (KENALOG) 0.1 % Apply 1 application topically 2 (two) times daily.   Yes [provider]    Inpatient Medications: Scheduled Meds:  Continuous Infusions: . sodium chloride 20 mL/hr (09/01/17 0027)   PRN Meds:   Allergies:    Allergies  Allergen Reactions  . Penicillins Swelling    Has patient had a PCN reaction causing immediate rash, facial/tongue/throat swelling, SOB or lightheadedness with hypotension: Yes Has patient had a PCN reaction causing severe rash involving mucus membranes or skin necrosis: No Has patient had a PCN reaction that required hospitalization: No Has patient had a PCN reaction occurring within the last 10 years: No If all of the above answers are "NO", then may proceed with Cephalosporin use.    Social History:   Social History   Socioeconomic History  . Marital status: Married    Spouse name: Not on file  . Number of children: Not on file  . Years of education: Not on file  . Highest education level: Not on file  Social Needs  . Financial resource strain: Not on file  . Food insecurity - worry: Not on file  . Food insecurity - inability: Not on file  . Transportation needs - medical: Not on file  . Transportation needs - non-medical: Not on file  Occupational History  . Not on file  Tobacco Use  . Smoking status: Former Smoker    Packs/day: 1.00    Types: Cigarettes    Last attempt to quit: 08/13/1968    Years since quitting: 49.0  . Smokeless tobacco: Never Used  Substance and Sexual Activity  . Alcohol use: No    Frequency: Never  . Drug use: Not on file  . Sexual activity: Not on file  Other Topics Concern  . Not on file  Social History Narrative  . Not on file    Family History:   Family History  Problem Relation Age of Onset    . Hypertension Father      ROS:  Review of Systems  Constitution: Positive for weakness and malaise/fatigue. Negative for fever.  HENT: Negative.   Eyes: Negative.   Cardiovascular: Positive for chest pain and orthopnea. Negative for leg swelling and palpitations.  Respiratory: Positive for cough and shortness of breath.   Endocrine: Negative.   Hematologic/Lymphatic: Negative.   Skin: Positive for itching (bilateral lower extremities).  Musculoskeletal: Positive for back pain (knee pain).  Gastrointestinal: Negative.   Genitourinary: Negative.   Psychiatric/Behavioral: Negative.   Allergic/Immunologic: Negative.      Physical Exam/Data:   Vitals:   09/01/17 0019  BP: (!) 106/51  Pulse: 63  Resp: 18  Temp: (!) 97.4 F (36.3 C)  TempSrc: Oral  SpO2: 100%  Weight: 155 lb (70.3 kg)  Height: 6' (1.829 m)   No intake or output data in the 24 hours ending 09/01/17 0047 Filed Weights   09/01/17 0019  Weight: 155 lb (70.3 kg)  Body mass index is 21.02 kg/m.  General: Thin, elderly man, lying in bed.  His wife and daughter at the bedside. HEENT: MMM. Conjunctival pallor noted. Lymph: no adenopathy Neck: no JVD or HJR Endocrine:  No thryomegaly Vascular: No carotid bruits; 1+ femoral pulses bilaterally.  Radial pulses are 1+ bilaterally. Cardiac: Distant heart sounds with 1/6 systolic murmur.  Regular rate and rhythm without rubs or gallops. Lungs: Coarse breath sounds bilaterally without wheezes or crackles. Abd: soft, nontender, no hepatomegaly  Ext: no edema Musculoskeletal:  No deformities, BUE and BLE strength normal and equal Skin: warm and dry  Neuro:  CNs 2-12 intact, no focal abnormalities noted Psych: Depressed and anxious affect; he should not became tearful multiple times during the visit.  EKG:  The EKG was personally reviewed and demonstrates: Normal sinus rhythm with left axis deviation and 1 mm ST elevation isolated to V3.  Biphasic T waves also noted  in V3 as well as T wave inversions in V4.  Wave inversions in V5 and V6 are no longer evident on today's tracing.  Otherwise, there has been no significant interval change since 08/24/17.  Relevant CV Studies: LHC (08/16/17): Severe three vessel disease, ostial LAD, ostial LCX, occluded RCA Also with significant aortic valve stenosis, Depressed EF on echo 30 to 35%  Echo (08/15/17): - Left ventricle: The cavity size was mildly dilated. Systolic   function was moderately to severely reduced. The estimated   ejection fraction was in the range of 30% to 35%. Hypokinesis of   the anteroseptal myocardium. Hypokinesis of the anterior   myocardium. Hypokinesis of the apical myocardium. Doppler   parameters are consistent with abnormal left ventricular   relaxation (grade 1 diastolic dysfunction). - Aortic valve: There was moderate stenosis. Stenosis may be   underestimated secondary to low EF. Valve area (VTI): 1.12 cm^2. - Left atrium: The atrium was normal in size. - Right ventricle: Systolic function was normal. - Pulmonary arteries: Systolic pressure could not be accurately   estimated. - Inferior vena cava: The vessel was dilated. The respirophasic   diameter changes were blunted (< 50%), consistent with elevated   central venous pressure.  Laboratory Data:  ChemistryNo results for input(s): NA, K, CL, CO2, GLUCOSE, BUN, CREATININE, CALCIUM, GFRNONAA, GFRAA, ANIONGAP in the last 168 hours.  No results for input(s): PROT, ALBUMIN, AST, ALT, ALKPHOS, BILITOT in the last 168 hours. Hematology Recent Labs  Lab 09/01/17 0017  WBC 2.6*  RBC 2.42*  HGB 8.3*  HCT 25.3*  MCV 104.5*  MCH 34.4*  MCHC 32.9  RDW 18.3*  PLT 93*   Cardiac EnzymesNo results for input(s): TROPONINI in the last 168 hours. No results for input(s): TROPIPOC in the last 168 hours.  BNPNo results for input(s): BNP, PROBNP in the last 168 hours.  DDimer No results for input(s): DDIMER in the last 168  hours.  Radiology/Studies:  No results found.  Assessment and Plan:   Chest pain with severe three-vessel coronary artery disease Chest pain that occurred abruptly tonight could represent angina, though other possibilities also include pulmonary embolism or recurrent pneumonia.  EKG today is not significantly changed from the prior tracing on 08/24/17.  I do not believe this represents a STEMI.  In light of his recent catheterization demonstrating severe three-vessel coronary artery disease and multiple comorbidities, I recommend against urgent catheterization at this time.  Mr. Princella Pellegrini and his family are in agreement with this.  I do not believe that he has any reasonable targets  for percutaneous revascularization given the extent of his disease and heavy calcification coupled with his age and other comorbidities.  He is also not a surgical candidate.  Trend troponins to see if this represents a recurrent ischemic event.  Given that hemoglobin has dropped again and is now 8.3, 1 could consider PRBC transfusion for possible component of demand ischemia in the setting of anemia.  It is reasonable to continue aspirin and clopidogrel for now as long as no obvious source of bleeding is identified.  Unless there is a significant rise in troponin, I would not start a heparin infusion at this time.  Chronic systolic heart failure Mr. Princella Pellegrini appears euvolemic on exam.  Extremities are warm arguing against a low output heart failure.  He continues to note shortness of breath and orthopnea, which could be due to any number of factors.  Obtain chest radiograph and BNP.  If there is objective evidence of pulmonary edema/heart acute failure based on these studies, careful diuresis could be considered.  In light of low blood pressure, I recommend holding metoprolol and lisinopril at this time.  They can be restarted carefully, as tolerated.  Aortic stenosis At least moderate aortic stenosis noted by  echocardiogram on 08/15/16.  Degree of AS may be underestimated by low flow/low gradient phenomenon setting of severely reduced LVEF.  No further workup at this time.  Even if stenosis is severe, Mr. Hopfensperger is not a candidate for intervention.  Hypertension Patient is hypotensive at this time, albeit after receiving several doses of sublingual nitroglycerin.  Hold metoprolol and lisinopril for now.  Medications can be restarted in a stepwise fashion as tolerated.  Myelodysplastic syndrome with significant anemia Hemoglobin is 8.3 today, down from 10.4 on 08/24/17.  Mr. Cindi Carbon his family denied any obvious bleeding.  I suspect this is related to his chronic MDS.  Consider blood transfusion, particularly if troponin rises significantly and/or Mr. Stepanek has recurrent chest pain, as this would suggest significant supply-demand mismatch.  Diabetes mellitus  Per hospitalist service.  Disposition:  Admit to hospitalist service.  We will continue to follow along.  Mr. Dorko has Derelle Cockrell-stage heart disease with severe three-vessel CAD, severe systolic dysfunction, and at least moderate aortic stenosis.  He has not a candidate for surgical or percutaneous interventions at this time.  Recommend palliative care consultation.  Code status:  Mr. Kamphaus states that he wishes to be DNR/DNI. His family is in agreement.  For questions or updates, please contact [place] Please consult [email or web address] for contact info under [facility] Cardiology   Signed, Nelva Bush, MD  09/01/2017 12:47 AM

## 2017-09-01 NOTE — ED Notes (Signed)
Dr Dahlia Client made aware at this time of pt's elevated Troponin level as reported by lab: Troponin 0.04ng/mL

## 2017-09-01 NOTE — Progress Notes (Signed)
[facility] [place] NAME: Terry Gonzales    MR#:  [id number]  DATE OF BIRTH:  1925/05/27  SUBJECTIVE:  Patient admitted last night after he had a sharp chest pain at home relieved with 3 nitroglycerin sublingual.  Overnight remained hemodynamically stable.  Complains of some chest soreness on the left midsternal on and off.  Patient's wife and daughter in the room.  REVIEW OF SYSTEMS:   Review of Systems  Constitutional: Negative for chills, fever and weight loss.  HENT: Negative for ear discharge, ear pain and nosebleeds.   Eyes: Negative for blurred vision, pain and discharge.  Respiratory: Negative for sputum production, shortness of breath, wheezing and stridor.   Cardiovascular: Positive for chest pain. Negative for palpitations, orthopnea and PND.  Gastrointestinal: Negative for abdominal pain, diarrhea, nausea and vomiting.  Genitourinary: Negative for frequency and urgency.  Musculoskeletal: Negative for back pain and joint pain.  Neurological: Negative for sensory change, speech change, focal weakness and weakness.  Psychiatric/Behavioral: Negative for depression and hallucinations. The patient is not nervous/anxious.    Tolerating Diet: Yes  DRUG ALLERGIES:   Allergies  Allergen Reactions  . Penicillins Swelling    Has patient had a PCN reaction causing immediate rash, facial/tongue/throat swelling, SOB or lightheadedness with hypotension: Yes Has patient had a PCN reaction causing severe rash involving mucus membranes or skin necrosis: No Has patient had a PCN reaction that required hospitalization: No Has patient had a PCN reaction occurring within the last 10 years: No If all of the above answers are "NO", then may proceed with Cephalosporin use.    VITALS:  Blood pressure (!) 121/55, pulse (!) 58, temperature 98.3 F (36.8 C), temperature source Oral, resp. rate 18, height 6' (1.829 m), weight 68.1 kg (150 lb 3.2  oz), SpO2 100 %.  PHYSICAL EXAMINATION:   Physical Exam  GENERAL:  84 y.o.-year-old patient lying in the bed with no acute distress.  EYES: Pupils equal, round, reactive to light and accommodation. No scleral icterus. Extraocular muscles intact.  HEENT: Head atraumatic, normocephalic. Oropharynx and nasopharynx clear.  NECK:  Supple, no jugular venous distention. No thyroid enlargement, no tenderness.  LUNGS: Normal breath sounds bilaterally, no wheezing, rales, rhonchi. No use of accessory muscles of respiration.  CARDIOVASCULAR: S1, S2 normal. No murmurs, rubs, or gallops.  ABDOMEN: Soft, nontender, nondistended. Bowel sounds present. No organomegaly or mass.  EXTREMITIES: No cyanosis, clubbing or edema b/l.    NEUROLOGIC: Cranial nerves II through XII are intact. No focal Motor or sensory deficits b/l.   PSYCHIATRIC:  patient is alert and oriented x 3.  SKIN: No obvious rash, lesion, or ulcer.   LABORATORY PANEL:  CBC Recent Labs  Lab 09/01/17 0846  WBC 1.7*  HGB 8.1*  HCT 25.0*  PLT 80*    Chemistries  Recent Labs  Lab 09/01/17 0017 09/01/17 0846  NA 137 138  K 4.2 4.4  CL 107 108  CO2 23 23  GLUCOSE 126* 105*  BUN 29* 26*  CREATININE 0.94 0.82  CALCIUM 9.0 8.8*  AST 21  --   ALT 13*  --   ALKPHOS 72  --   BILITOT 0.4  --    Cardiac Enzymes Recent Labs  Lab 09/01/17 0846  TROPONINI 0.04*   RADIOLOGY:  Dg Chest Portable 1 View  Result Date: 09/01/2017 CLINICAL DATA:  Chest pain and shortness of breath EXAM: PORTABLE CHEST 1 VIEW COMPARISON:  08/24/2017 FINDINGS: There is mild  normal heart size. No pleural effusion or edema. Calcified granulomas within the left upper lobe. No airspace consolidation. IMPRESSION: No active disease. Electronically Signed   By: Kerby Moors M.D.   On: 09/01/2017 00:58   ASSESSMENT AND PLAN:   Terry Gonzales  is a 84 y.o. male with a known history of myelodysplastic syndrome coronary artery disease, hypertension, diabetes  mellitus, aortic stenosis presented to the emergency room with chest pain.  The chest pain started around 11:30 PM last night and was severe located in the left side of the chest 10 out of 10 initially.  Patient's wife called the EMS and patient was evaluated and brought to the emergency room  1.  Unstable angina -Patient recently was discharged 08/26/2017 after having non-STEMI and underwent cardiac catheterization which showed severe three-vessel disease medical management was recommended.  Per cardiology patient does not have any reasonable targets for PCI given his comorbidities. Optimize cardiac meds with aspirin,, long-acting nitrates, statins, Plavix, low-dose metoprolol -PRN morphine for chest pain  2.  Ischemic cardiomyopathy EF 30-35% with history of aortic stenosis noted on echo -Decrease dose of lisinopril to 10 mg daily given soft blood pressure -Continue cardiac meds  3.  Myelodysplastic syndrome with significant anemia -Patient's hemoglobin dropped to 8.3 down from 10.4 on 08/24/2017 -No active bleeding at home -We will give 1 unit of blood transfusion given current cardiac issues.  This was discussed with Dr. Rogue Bussing patient's hematologist. -Family and patient agree with blood transfusion  4.  Type 2 diabetes continue home meds  5.  DVT prophylaxis subcu heparin      Case discussed with Care Management/Social Worker. Management plans discussed with the patient, family and they are in agreement.  CODE STATUS: DNR  DVT Prophylaxis: \] Heparin  TOTAL TIME TAKING CARE OF THIS PATIENT: 30 minutes.  >50% time spent on counselling and coordination of care  POSSIBLE D/C IN 1-2 DAYS, DEPENDING ON CLINICAL CONDITION.  Note: This dictation was prepared with Dragon dictation along with smaller phrase technology. Any transcriptional errors that result from this process are unintentional.  Fritzi Mandes M.D on 09/01/2017 at 1:35 PM  Between 7am to 6pm - Pager -  [phone]  After 6pm go to [email or web address] - password [email or web address] [facility]  Office  [phone]  CC: Primary care physician; Zolotor, Audie Clear, MDPatient ID: Terry Gonzales, male   DOB: [birth year], 84 y.o.   MRN: [id number]

## 2017-09-02 ENCOUNTER — Other Ambulatory Visit: Payer: Self-pay

## 2017-09-02 ENCOUNTER — Telehealth: Payer: Self-pay | Admitting: Cardiovascular Disease

## 2017-09-02 DIAGNOSIS — I208 Other forms of angina pectoris: Secondary | ICD-10-CM | POA: Diagnosis not present

## 2017-09-02 LAB — BPAM RBC
Blood Product Expiration Date: 201902032359
ISSUE DATE / TIME: 201901201156
Unit Type and Rh: 600

## 2017-09-02 LAB — CBC
HCT: 27 % — ABNORMAL LOW (ref 40.0–52.0)
Hemoglobin: 9 g/dL — ABNORMAL LOW (ref 13.0–18.0)
MCH: 34.5 pg — AB (ref 26.0–34.0)
MCHC: 33.3 g/dL (ref 32.0–36.0)
MCV: 103.6 fL — ABNORMAL HIGH (ref 80.0–100.0)
PLATELETS: 71 10*3/uL — AB (ref 150–440)
RBC: 2.61 MIL/uL — AB (ref 4.40–5.90)
RDW: 18.7 % — ABNORMAL HIGH (ref 11.5–14.5)
WBC: 1.8 10*3/uL — ABNORMAL LOW (ref 3.8–10.6)

## 2017-09-02 LAB — TYPE AND SCREEN
ABO/RH(D): A NEG
Antibody Screen: NEGATIVE
Unit division: 0

## 2017-09-02 MED ORDER — ISOSORBIDE MONONITRATE ER 30 MG PO TB24: 30.0000 mg | ORAL_TABLET | Freq: Every day | ORAL | 1 refills | Status: DC

## 2017-09-02 MED ORDER — LISINOPRIL 10 MG PO TABS: 10.0000 mg | ORAL_TABLET | Freq: Every day | ORAL | 1 refills | Status: DC

## 2017-09-02 NOTE — Discharge Summary (Signed)
[facility] [place] NAME: Terry Gonzales    MR#:  [id number]  DATE OF BIRTH:  [birth year]  DATE OF ADMISSION:  09/01/2017 ADMITTING PHYSICIAN: Saundra Shelling, MD  DATE OF DISCHARGE: 09/02/2017  PRIMARY CARE PHYSICIAN: Zolotor, Audie Clear, MD    ADMISSION DIAGNOSIS:  Unstable angina ([place]) [I20.0]  DISCHARGE DIAGNOSIS:  Unstable angina Severe 3 vessel CAD on recent LHC in jan 2019 Ischemic cardiomyopathy EF 30% Acute on chronic anemia due to MDS--s/p 1 unit BT  SECONDARY DIAGNOSIS:   Past Medical History:  Diagnosis Date  . Aortic stenosis   . Diabetes mellitus without complication ([place])   . Hypertension   . Myelodysplastic syndrome [facility])     HOSPITAL COURSE:   GeraldPickleris a84 y.o.malewith a known history of myelodysplastic syndrome coronary artery disease, hypertension, diabetes mellitus, aortic stenosis presented to the emergency room with chest pain. The chest pain started around 11:30 PM last night and was severe located in the left side of the chest 10 out of 10 initially. Patient's wife called the EMS and patient was evaluated and brought to the emergency room  1.Unstable angina -Patient recently was discharged 08/26/2017 after having non-STEMI and underwent cardiac catheterization which showed severe three-vessel disease medical management was recommended.  Per cardiology patient does not have any reasonable targets for PCI given his comorbidities. Optimize cardiac meds with aspirin, long-acting nitrates, statins, Plavix, low-dose metoprolol, lisinopril -PRN morphine for chest pain -added low dose Imdur -denies any CP  2.Ischemic cardiomyopathy EF 30-35% with history of aortic stenosis noted on echo -Decrease dose of lisinopril to 10 mg daily given soft blood pressure -Continue other cardiac meds  3.Myelodysplastic syndrome with significant anemia -Patient's hemoglobin dropped to 8.3 down from 10.4 on  08/24/2017 -No active bleeding at home -We will give 1 unit of blood transfusion given current cardiac issues.  This was discussed with Dr. Rogue Bussing patient's hematologist. -Family and patient agree with blood transfusion -hgb today 9.0  4.  Type 2 diabetes continue home meds  5.  DVT prophylaxis subcu heparin  Overall improving PT to see this am D/c if remains stable D/w Dr Fletcher Anon  CONSULTS OBTAINED:  Treatment Team:  Nelva Bush, MD Constance Haw, MD  DRUG ALLERGIES:   Allergies  Allergen Reactions  . Penicillins Swelling    Has patient had a PCN reaction causing immediate rash, facial/tongue/throat swelling, SOB or lightheadedness with hypotension: Yes Has patient had a PCN reaction causing severe rash involving mucus membranes or skin necrosis: No Has patient had a PCN reaction that required hospitalization: No Has patient had a PCN reaction occurring within the last 10 years: No If all of the above answers are "NO", then may proceed with Cephalosporin use.    DISCHARGE MEDICATIONS:   Allergies as of 09/02/2017      Reactions   Penicillins Swelling   Has patient had a PCN reaction causing immediate rash, facial/tongue/throat swelling, SOB or lightheadedness with hypotension: Yes Has patient had a PCN reaction causing severe rash involving mucus membranes or skin necrosis: No Has patient had a PCN reaction that required hospitalization: No Has patient had a PCN reaction occurring within the last 10 years: No If all of the above answers are "NO", then may proceed with Cephalosporin use.      Medication List    TAKE these medications   albuterol 108 (90 Base) MCG/ACT inhaler Commonly known as:  PROVENTIL HFA;VENTOLIN HFA Inhale 2 puffs into the lungs  every 6 (six) hours as needed for wheezing or shortness of breath.   aspirin 81 MG EC tablet Take 1 tablet (81 mg total) by mouth daily.   bisacodyl 5 MG EC tablet Commonly known as:  DULCOLAX Take 1  tablet (5 mg total) by mouth daily as needed for moderate constipation.   clopidogrel 75 MG tablet Commonly known as:  PLAVIX Take 1 tablet (75 mg total) by mouth daily.   Darbepoetin Alfa 500 MCG/ML Sosy injection Commonly known as:  ARANESP Inject 500 mcg into the skin every 30 (thirty) days.   isosorbide mononitrate 30 MG 24 hr tablet Commonly known as:  IMDUR Take 1 tablet (30 mg total) by mouth daily. Start taking on:  09/03/2017   lisinopril 10 MG tablet Commonly known as:  PRINIVIL,ZESTRIL Take 1 tablet (10 mg total) by mouth daily. What changed:    medication strength  how much to take   metoprolol succinate 25 MG 24 hr tablet Commonly known as:  TOPROL-XL Take 0.5 tablets (12.5 mg total) by mouth daily.   MULTI-VITAMINS Tabs Take 1 tablet by mouth daily.   nitroGLYCERIN 0.4 MG SL tablet Commonly known as:  NITROSTAT Place 1 tablet (0.4 mg total) under the tongue every 5 (five) minutes as needed for chest pain.   simvastatin 10 MG tablet Commonly known as:  ZOCOR Take 10 mg by mouth daily.   triamcinolone cream 0.1 % Commonly known as:  KENALOG Apply 1 application topically 2 (two) times daily.       If you experience worsening of your admission symptoms, develop shortness of breath, life threatening emergency, suicidal or homicidal thoughts you must seek medical attention immediately by calling 911 or calling your MD immediately  if symptoms less severe.  You Must read complete instructions/literature along with all the possible adverse reactions/side effects for all the Medicines you take and that have been prescribed to you. Take any new Medicines after you have completely understood and accept all the possible adverse reactions/side effects.   Please note  You were cared for by a hospitalist during your hospital stay. If you have any questions about your discharge medications or the care you received while you were in the hospital after you are  discharged, you can call the unit and asked to speak with the hospitalist on call if the hospitalist that took care of you is not available. Once you are discharged, your primary care physician will handle any further medical issues. Please note that NO REFILLS for any discharge medications will be authorized once you are discharged, as it is imperative that you return to your primary care physician (or establish a relationship with a primary care physician if you do not have one) for your aftercare needs so that they can reassess your need for medications and monitor your lab values. Today   SUBJECTIVE   Doing well. Denies any CP since yday morning  VITAL SIGNS:  Blood pressure (!) 112/48, pulse (!) 51, temperature (!) 97.5 F (36.4 C), temperature source Oral, resp. rate 18, height 6' (1.829 m), weight 68.1 kg (150 lb 3.2 oz), SpO2 97 %.  I/O:    Intake/Output Summary (Last 24 hours) at 09/02/2017 0828 Last data filed at 09/02/2017 3329 Gross per 24 hour  Intake 1326 ml  Output 2150 ml  Net -824 ml    PHYSICAL EXAMINATION:  GENERAL:  84 y.o.-year-old patient lying in the bed with no acute distress.  EYES: Pupils equal, round, reactive to light and  accommodation. No scleral icterus. Extraocular muscles intact.  HEENT: Head atraumatic, normocephalic. Oropharynx and nasopharynx clear.  NECK:  Supple, no jugular venous distention. No thyroid enlargement, no tenderness.  LUNGS: Normal breath sounds bilaterally, no wheezing, rales,rhonchi or crepitation. No use of accessory muscles of respiration.  CARDIOVASCULAR: S1, S2 normal. No murmurs, rubs, or gallops.  ABDOMEN: Soft, non-tender, non-distended. Bowel sounds present. No organomegaly or mass.  EXTREMITIES: No pedal edema, cyanosis, or clubbing.  NEUROLOGIC: Cranial nerves II through XII are intact. Muscle strength 5/5 in all extremities. Sensation intact. Gait not checked.  PSYCHIATRIC: The patient is alert and oriented x 3.  SKIN: No  obvious rash, lesion, or ulcer.   DATA REVIEW:   CBC  Recent Labs  Lab 09/02/17 0536  WBC 1.8*  HGB 9.0*  HCT 27.0*  PLT 71*    Chemistries  Recent Labs  Lab 09/01/17 0017 09/01/17 0846  NA 137 138  K 4.2 4.4  CL 107 108  CO2 23 23  GLUCOSE 126* 105*  BUN 29* 26*  CREATININE 0.94 0.82  CALCIUM 9.0 8.8*  AST 21  --   ALT 13*  --   ALKPHOS 72  --   BILITOT 0.4  --     Microbiology Results   Recent Results (from the past 240 hour(s))  MRSA PCR Screening     Status: None   Collection Time: 09/01/17  3:40 AM  Result Value Ref Range Status   MRSA by PCR NEGATIVE NEGATIVE Final    Comment:        The GeneXpert MRSA Assay (FDA approved for NASAL specimens only), is one component of a comprehensive MRSA colonization surveillance program. It is not intended to diagnose MRSA infection nor to guide or monitor treatment for MRSA infections. Performed at [facility], [address] [place], [place] [address]     RADIOLOGY:  Dg Chest Portable 1 View  Result Date: 09/01/2017 CLINICAL DATA:  Chest pain and shortness of breath EXAM: PORTABLE CHEST 1 VIEW COMPARISON:  08/24/2017 FINDINGS: There is mild normal heart size. No pleural effusion or edema. Calcified granulomas within the left upper lobe. No airspace consolidation. IMPRESSION: No active disease. Electronically Signed   By: Kerby Moors M.D.   On: 09/01/2017 00:58     Management plans discussed with the patient, family and they are in agreement.  CODE STATUS:     Code Status Orders  (From admission, onward)        Start     Ordered   09/01/17 0258  Do not attempt resuscitation (DNR)  Continuous    Question Answer Comment  In the event of cardiac or respiratory ARREST Do not call a "code blue"   In the event of cardiac or respiratory ARREST Do not perform Intubation, CPR, defibrillation or ACLS   In the event of cardiac or respiratory ARREST Use medication by any route, position, wound  care, and other measures to relive pain and suffering. May use oxygen, suction and manual treatment of airway obstruction as needed for comfort.      09/01/17 0257    Code Status History    Date Active Date Inactive Code Status Order ID Comments User Context   09/01/2017 01:16 09/01/2017 02:57 DNR [id number]  Nelva Bush, MD ED   08/14/2017 22:12 08/19/2017 20:55 Full Code [id number]  Amelia Jo, MD Inpatient      TOTAL TIME TAKING CARE OF THIS PATIENT: *40* minutes.    Fritzi Mandes M.D on 09/02/2017  at 8:28 AM  Between 7am to 6pm - Pager - [phone] After 6pm go to [email or web address] - password [email or web address] [facility]  Office  [phone]  CC: Primary care physician; Zolotor, Audie Clear, MD

## 2017-09-02 NOTE — Discharge Planning (Signed)
Discharge instructions reviewed with pt. Pt verbalizes understanding. Pt ready for discharge home. 

## 2017-09-02 NOTE — Progress Notes (Signed)
Progress Note  Patient Name: Terry Gonzales Date of Encounter: 09/02/2017  Primary Cardiologist: Rockey Situ  Subjective   No further chest pain. Started on Imdur 1/20. Status post 1 unit pRBC on 1/20 with HGB trend of 8.1-->9.0. Troponin minimally elevated and flat trending with a peak of 0.04. Planning to ambulate this morning.    Inpatient Medications    Scheduled Meds: . aspirin EC  81 mg Oral Daily  . clopidogrel  75 mg Oral Daily  . heparin  5,000 Units Subcutaneous Q8H  . isosorbide mononitrate  30 mg Oral Daily  . lisinopril  10 mg Oral Daily  . metoprolol succinate  12.5 mg Oral Daily  . multivitamin with minerals  1 tablet Oral Daily  . simvastatin  10 mg Oral Daily  . sodium chloride flush  3 mL Intravenous Q12H  . triamcinolone cream  1 application Topical BID   Continuous Infusions: . sodium chloride 20 mL/hr (09/01/17 0027)  . sodium chloride     PRN Meds: sodium chloride, acetaminophen, albuterol, bisacodyl, nitroGLYCERIN, ondansetron (ZOFRAN) IV, sodium chloride flush   Vital Signs    Vitals:   09/01/17 1547 09/01/17 2022 09/02/17 0403 09/02/17 0721  BP: (!) 95/46 (!) 111/38 (!) 106/46 (!) 112/48  Pulse: (!) 52 (!) 53 (!) 58 (!) 51  Resp: 18 16 18    Temp: 97.7 F (36.5 C) 97.8 F (36.6 C) 97.9 F (36.6 C) (!) 97.5 F (36.4 C)  TempSrc: Oral Oral Oral Oral  SpO2: 96% 100% 99% 97%  Weight:      Height:        Intake/Output Summary (Last 24 hours) at 09/02/2017 0801 Last data filed at 09/02/2017 0723 Gross per 24 hour  Intake 1326 ml  Output 2150 ml  Net -824 ml   Filed Weights   09/01/17 0019 09/01/17 0257  Weight: 155 lb (70.3 kg) 150 lb 3.2 oz (68.1 kg)    Telemetry    Sinus rhythm to sinus bradycardia with heart rates in the upper 40s to low 60s bpm - Personally Reviewed  ECG    n/a - Personally Reviewed  Physical Exam   GEN: No acute distress.   Neck: No JVD. Cardiac: RRR, II/VI systolic murmur, no rubs, or gallops.    Respiratory: Faint bibasilar crackles.  GI: Soft, nontender, non-distended.   MS: No edema; No deformity. Neuro:  Alert and oriented x 3; Nonfocal.  Psych: Normal affect.  Labs    Chemistry Recent Labs  Lab 09/01/17 0017 09/01/17 0846  NA 137 138  K 4.2 4.4  CL 107 108  CO2 23 23  GLUCOSE 126* 105*  BUN 29* 26*  CREATININE 0.94 0.82  CALCIUM 9.0 8.8*  PROT 6.8  --   ALBUMIN 3.3*  --   AST 21  --   ALT 13*  --   ALKPHOS 72  --   BILITOT 0.4  --   GFRNONAA >60 >60  GFRAA >60 >60  ANIONGAP 7 7     Hematology Recent Labs  Lab 09/01/17 0017 09/01/17 0846 09/02/17 0536  WBC 2.6* 1.7* 1.8*  RBC 2.42* 2.37* 2.61*  HGB 8.3* 8.1* 9.0*  HCT 25.3* 25.0* 27.0*  MCV 104.5* 105.2* 103.6*  MCH 34.4* 34.1* 34.5*  MCHC 32.9 32.4 33.3  RDW 18.3* 18.4* 18.7*  PLT 93* 80* 71*    Cardiac Enzymes Recent Labs  Lab 09/01/17 0017 09/01/17 0315 09/01/17 0846 09/01/17 1447  TROPONINI 0.04* 0.03* 0.04* 0.03*   No results for  input(s): TROPIPOC in the last 168 hours.   BNP Recent Labs  Lab 09/01/17 0018  BNP 183.0*     DDimer No results for input(s): DDIMER in the last 168 hours.   Radiology    Dg Chest Portable 1 View  Result Date: 09/01/2017 IMPRESSION: No active disease. Electronically Signed   By: Kerby Moors M.D.   On: 09/01/2017 00:58    Cardiac Studies   TTE 08/15/2017: Study Conclusions  - Left ventricle: The cavity size was mildly dilated. Systolic   function was moderately to severely reduced. The estimated   ejection fraction was in the range of 30% to 35%. Hypokinesis of   the anteroseptal myocardium. Hypokinesis of the anterior   myocardium. Hypokinesis of the apical myocardium. Doppler   parameters are consistent with abnormal left ventricular   relaxation (grade 1 diastolic dysfunction). - Aortic valve: There was moderate stenosis. Stenosis may be   underestimated secondary to low EF. Valve area (VTI): 1.12 cm^2. - Left atrium: The atrium  was normal in size. - Right ventricle: Systolic function was normal. - Pulmonary arteries: Systolic pressure could not be accurately   estimated. - Inferior vena cava: The vessel was dilated. The respirophasic   diameter changes were blunted (< 50%), consistent with elevated   central venous pressure.  LHC 08/16/2017: Procedural Findings:  Coronary angiography:  Coronary dominance: Right  Left mainstem: Large vessel that bifurcates into the LAD and left circumflex, no significant disease noted  Left anterior descending (LAD): Large vessel that extends to the apical region, diagonal branch 2 of moderate size, severe ostial and proximal disease  Left circumflex (LCx): Large vessel with OM branch 2, moderate to severe ostial disease, severe OM1 and OM2 disease  Right coronary artery (RCA): Right dominant vessel , subtotally occluded ostially, severe mid vessel disease, extasia distal vessel, collaterals from left to right, likely a CTO  Left ventriculography: unable to cross the aortic valve secondary to dilated root, heavy aortic valve calcification, likely moderate to severe, possibly severe aortic valve stenosis  Final Conclusions:  Severe three vessel disease, ostial LAD, ostial LCX, occluded RCA Also with significant aortic valve stenosis, Depressed EF on echo 30 to 35% Will discuss with family, Unclear if he would be a candidate for CABG given age, comorbidities/MDS   Conclusion    Ost 2nd Mrg to 2nd Mrg lesion is 80% stenosed.  Mid Cx lesion is 75% stenosed.  Ost Cx lesion is 75% stenosed.  Prox LAD lesion is 80% stenosed.  Ost LAD lesion is 75% stenosed.  Ost RCA lesion is 99% stenosed.  Mid RCA lesion is 95% stenosed.  Dist RCA lesion is 99% stenosed.    Recommendations:  Will restart heparin later this evening Family meeting for goals of care Consider palliative consult  Patient Profile     84 y.o. male with history of severe three-vessel coronary  artery disease with recent and STEMI, severe systolic heart failure secondary to ischemic cardiomyopathy, at least moderate aortic stenosis, hypertension, diabetes mellitus, and myelodysplastic syndrome, who is being seen today for the evaluation of chest pain and abnormal EKG.  Assessment & Plan    1. Severe 3-vessel CAD with angina/elevated troponin: -No further chest pain -Troponin minimally elevated with a peak of 0.04, likely supply demand ischemia in the setting of known 3-vessel CAD with symptomatic anemia -Recent LHC 08/16/2017 showing severe 3-vessel CAD, not amenable to PCI or bypass -Continue medical management with ASA and DAPT -Imdur was added 1/20, tolerating well,  continue -LDL at goal on simvastatin -Toprol -Cardiac rehab as an outpatient -Ambulate this morning, if chest pain free may be able to go home if ok with MD  2. Chronic systolic CHF/ICM: -He does not appear grossly volume up at this time -Consider changing Toprol to Coreg for less effect on heart rate -Lisinopril -Plan to add spironolactone as an outpatient if BP allows -CHF education -Strict Is and Os  3. Aortic stenosis: -At least moderate aortic stenosis noted on TTE 08/15/2017. However, this may be underestimated given his cardiomyopathy -He would not likely be a candidate for invasive valvular workup given his advanced age and comorbid conditions  -Follow clinically  4. Myelodysplastic syndrome: -Status post 1 unit pRBC -HGB improved this morning  5. HTN: -Soft, though stable    For questions or updates, please contact [facility] Please consult [email or web address] for contact info under Cardiology/STEMI.    Signed, Christell Faith, PA-C [place] Pager: [phone] 09/02/2017, 8:01 AM

## 2017-09-02 NOTE — Plan of Care (Signed)
  Progressing Pain Managment: General experience of comfort will improve 09/02/2017 0004 - Progressing by Jeri Cos, RN Activity: Ability to tolerate increased activity will improve 09/02/2017 0004 - Progressing by Jeri Cos, RN

## 2017-09-02 NOTE — Telephone Encounter (Signed)
TCM....  Patient is being discharged   They saw Dr. Saunders Revel - est with Dr. Rockey Situ  They are scheduled to see Sharolyn Douglas on 1/23  They were seen for chest pain with severe three-vessel coronary artery disease  They need to be seen within 1 week  Pt not added to wait list   Please call

## 2017-09-02 NOTE — Care Management Obs Status (Signed)
[place] NOTIFICATION   Patient Details  Name: Lamario Mani MRN: [id number] Date of Birth: March 31, 1925   Medicare Observation Status Notification Given:  Yes    Shelbie Ammons, RN 09/02/2017, 8:54 AM

## 2017-09-02 NOTE — Evaluation (Cosign Needed Addendum)
Physical Therapy Evaluation Patient Details Name: Terry Gonzales MRN: [id number] DOB: [birth year] Today's Date: 09/02/2017   History of Present Illness  Pt is a 84 y.o. male presenting to hospital with cough and chest pain.  Pt admitted with B PNA, NSTEMI, acute renal failure; also with elevated troponin (peak 25.82) and hypotensive during hospitalization.  S/p multiple PRBC transfusions and s/p cath 08/16/17.  PMH includes DM, htn, myelodysplastic syndrome.  Clinical Impression  Pt is a 84 year old male who lives in a house with his wife.  Pt was able to ambulate 200 ft with RW and min VC's for placement of RW and upright posture.  Pt is able to perform bed mobility mod I with bed rail and transfers mod I with RW.  PT provided pt ed for safe management of AD.  Pt reported no pain today as compared to yesterday.  He presented with overall WNL strength of UE and LE and reported decreased sensation of first digit of BLE.  Pt is currently working with a PT in his home and PT reviewed appropriate home exercises for pt to do in hospital bed between PT treatments until he can resume therapy at home.  Pt will continue to benefit from skilled PT with focus on balance, strength, proper use of AD and functional mobility.    Follow Up Recommendations Home health PT    Equipment Recommendations       Recommendations for Other Services       Precautions / Restrictions Precautions Precautions: Fall      Mobility  Bed Mobility Overal bed mobility: Modified Independent             General bed mobility comments: Pt able to perform bed mobility with use of bed rail and requiring increased time.  Transfers Overall transfer level: Modified independent Equipment used: Rolling walker (2 wheeled) Transfers: Sit to/from Stand Sit to Stand: Supervision         General transfer comment: Pt demonstrated good hand placement and body mechanics during STS with RW.  Ambulation/Gait Ambulation/Gait  assistance: Supervision Ambulation Distance (Feet): 200 Feet Assistive device: Standard walker     Gait velocity interpretation: at or above normal speed for age/gender General Gait Details: Pt demonstrated good reciprocal gait pattern, good foot clearance and step length.  PT provided VC's for placement of RW in proximity to pt's body as well as upright posture to reduce fall risk and to facilitate respiration.  Stairs            Wheelchair Mobility    Modified Rankin (Stroke Patients Only)       Balance Overall balance assessment: Modified Independent Sitting-balance support: Single extremity supported       Standing balance support: Bilateral upper extremity supported                                 Pertinent Vitals/Pain      Home Living Family/patient expects to be discharged to:: Private residence Living Arrangements: Spouse/significant other Available Help at Discharge: Family Type of Home: House Home Access: Stairs to enter Entrance Stairs-Rails: Can reach both Entrance Stairs-Number of Steps: 3 Home Layout: One level Home Equipment: Walker - 2 wheels;Cane - single point Additional Comments: Pt currently uses RW but states that he has been working with his [facility] PT to use SPC again.    Prior Function Level of Independence: Needs assistance   Gait / Transfers Assistance  Needed: Pt received HH PT and uses RW for mobility  ADL's / Homemaking Assistance Needed: Wife assists with bathing and toileting        Hand Dominance        Extremity/Trunk Assessment   Upper Extremity Assessment Upper Extremity Assessment: Overall WFL for tasks assessed    Lower Extremity Assessment Lower Extremity Assessment: Overall WFL for tasks assessed    Cervical / Trunk Assessment Cervical / Trunk Assessment: Normal  Communication   Communication: HOH  Cognition Arousal/Alertness: Awake/alert Behavior During Therapy: WFL for tasks  assessed/performed Overall Cognitive Status: Within Functional Limits for tasks assessed                                        General Comments      Exercises     Assessment/Plan    PT Assessment Patient needs continued PT services  PT Problem List Decreased strength;Decreased mobility;Decreased activity tolerance;Decreased balance;Decreased knowledge of use of DME       PT Treatment Interventions DME instruction;Therapeutic activities;Gait training;Therapeutic exercise;Stair training;Balance training;Functional mobility training    PT Goals (Current goals can be found in the Care Plan section)  Acute Rehab PT Goals Patient Stated Goal: To return home and continue working with his [facility] PT. PT Goal Formulation: With patient Time For Goal Achievement: 09/16/17 Potential to Achieve Goals: Good    Frequency Min 2X/week   Barriers to discharge        Co-evaluation               AM-PAC PT "6 Clicks" Daily Activity  Outcome Measure Difficulty turning over in bed (including adjusting bedclothes, sheets and blankets)?: A Little Difficulty moving from lying on back to sitting on the side of the bed? : A Little Difficulty sitting down on and standing up from a chair with arms (e.g., wheelchair, bedside commode, etc,.)?: A Little Help needed moving to and from a bed to chair (including a wheelchair)?: A Little Help needed walking in hospital room?: A Little Help needed climbing 3-5 steps with a railing? : A Little 6 Click Score: 18    End of Session Equipment Utilized During Treatment: Gait belt Activity Tolerance: Patient tolerated treatment well Patient left: in bed;with bed alarm set;with family/visitor present;with call bell/phone within reach Nurse Communication: Mobility status PT Visit Diagnosis: Unsteadiness on feet (R26.81);Muscle weakness (generalized) (M62.81)    Time: 1120-1140 PT Time Calculation (min) (ACUTE ONLY): 20 min   Charges:   PT  Evaluation $PT Eval Low Complexity: 1 Low PT Treatments $Therapeutic Activity: 8-22 mins   PT G Codes:   PT G-Codes **NOT FOR INPATIENT CLASS** Functional Assessment Tool Used: AM-PAC 6 Clicks Basic Mobility Functional Limitation: Mobility: Walking and moving around Mobility: Walking and Moving Around Current Status (N3614): At least 20 percent but less than 40 percent impaired, limited or restricted Mobility: Walking and Moving Around Goal Status [phone]): At least 1 percent but less than 20 percent impaired, limited or restricted    Roxanne Gates, PT, DPT   Roxanne Gates 09/02/2017, 12:56 PM   Physician added as co-signer to note. Husain Costabile H. Owens Shark, PT, DPT, NCS 09/09/17, 5:43 PM [phone]

## 2017-09-02 NOTE — Care Management (Signed)
Discharge to home today per Dr. Fritzi Mandes. Followed by [facility] in the home. Text message to [facility] concerning discharge, Daughter will transport Shelbie Ammons RN MSN [facility] [phone]

## 2017-09-04 ENCOUNTER — Telehealth: Payer: Self-pay | Admitting: Cardiovascular Disease

## 2017-09-04 ENCOUNTER — Other Ambulatory Visit: Payer: Self-pay

## 2017-09-04 ENCOUNTER — Ambulatory Visit: Payer: Medicare Other | Admitting: Nurse Practitioner

## 2017-09-04 ENCOUNTER — Emergency Department
Admission: EM | Admit: 2017-09-04 | Discharge: 2017-09-04 | Disposition: A | Discharge status: Home / Self Care | Payer: Medicare Other | Attending: Emergency Medicine | Admitting: Emergency Medicine

## 2017-09-04 DIAGNOSIS — Z87891 Personal history of nicotine dependence: Secondary | ICD-10-CM | POA: Diagnosis not present

## 2017-09-04 DIAGNOSIS — I10 Essential (primary) hypertension: Secondary | ICD-10-CM | POA: Diagnosis not present

## 2017-09-04 DIAGNOSIS — E119 Type 2 diabetes mellitus without complications: Secondary | ICD-10-CM | POA: Insufficient documentation

## 2017-09-04 DIAGNOSIS — I252 Old myocardial infarction: Secondary | ICD-10-CM | POA: Insufficient documentation

## 2017-09-04 DIAGNOSIS — K5641 Fecal impaction: Secondary | ICD-10-CM | POA: Insufficient documentation

## 2017-09-04 DIAGNOSIS — K5900 Constipation, unspecified: Secondary | ICD-10-CM | POA: Diagnosis present

## 2017-09-04 DIAGNOSIS — D469 Myelodysplastic syndrome, unspecified: Secondary | ICD-10-CM | POA: Insufficient documentation

## 2017-09-04 MED ORDER — SENNOSIDES-DOCUSATE SODIUM 8.6-50 MG PO TABS: 2.0000 | ORAL_TABLET | Freq: Every day | ORAL | 1 refills | Status: AC | PRN

## 2017-09-04 MED ORDER — DOCUSATE SODIUM 100 MG PO CAPS: 100.0000 mg | ORAL_CAPSULE | Freq: Every day | ORAL | 2 refills | Status: DC | PRN

## 2017-09-04 NOTE — Telephone Encounter (Signed)
Please advise if ok to refill Plavix and Metoprolol. Gollan not original prescriber.

## 2017-09-04 NOTE — ED Notes (Signed)
Pt had large BM at bedside commode. Pt states pain "is so much better". 7/10

## 2017-09-04 NOTE — ED Provider Notes (Signed)
[facility] Emergency Department Provider Note       Time seen: ----------------------------------------- 10:14 AM on 09/04/2017 -----------------------------------------   I have reviewed the triage vital signs and the nursing notes.  HISTORY   Chief Complaint Constipation    HPI Terry Gonzales is a 84 y.o. male with a history of aortic stenosis, diabetes, hypertension and myelodysplastic syndrome who presents to the ED for obstipation.  Patient reports he cannot remember the last bowel movement, he thinks he was approximately 1 week ago.  Patient was discharged from the hospital over the weekend and reportedly has not been able to pass any stool.  He states that home health nurse came and evaluated him and did an enema and felt he was impacted so he was sent here for evaluation.  Past Medical History:  Diagnosis Date  . Aortic stenosis   . Diabetes mellitus without complication ([place])   . Hypertension   . Myelodysplastic syndrome [facility])     Patient Active Problem List   Diagnosis Date Noted  . Chest pain 09/01/2017  . MDS (myelodysplastic syndrome), low grade ([place]) 08/20/2017  . NSTEMI (non-ST elevated myocardial infarction) ([place])   . Acute combined systolic and diastolic heart failure ([place])   . Severe aortic stenosis   . Pneumonia 08/14/2017    Past Surgical History:  Procedure Laterality Date  . LEFT HEART CATH AND CORONARY ANGIOGRAPHY N/A 08/16/2017   Procedure: LEFT HEART CATH AND CORONARY ANGIOGRAPHY;  Surgeon: Minna Merritts, MD;  Location: [place] CV LAB;  Service: Cardiovascular;  Laterality: N/A;    Allergies Penicillins  Social History Social History   Tobacco Use  . Smoking status: Former Smoker    Packs/day: 1.00    Types: Cigarettes    Last attempt to quit: 08/13/1968    Years since quitting: 49.0  . Smokeless tobacco: Never Used  Substance Use Topics  . Alcohol use: No    Frequency: Never  . Drug use: No     Review of Systems Constitutional: Negative for fever. Cardiovascular: Negative for chest pain. Respiratory: Negative for shortness of breath. Gastrointestinal: Negative for abdominal pain, but positive for fecal impaction and constipation Musculoskeletal: Negative for back pain. Skin: Negative for rash. Neurological: Negative for headaches, focal weakness or numbness.  All systems negative/normal/unremarkable except as stated in the HPI  ____________________________________________   PHYSICAL EXAM:  VITAL SIGNS: ED Triage Vitals  Enc Vitals Group     BP 09/04/17 1010 121/71     Pulse Rate 09/04/17 1010 79     Resp 09/04/17 1010 18     Temp 09/04/17 1010 (!) 97.4 F (36.3 C)     Temp Source 09/04/17 1010 Oral     SpO2 09/04/17 1010 100 %     Weight 09/04/17 1013 150 lb (68 kg)     Height 09/04/17 1013 6' (1.829 m)     Head Circumference --      Peak Flow --      Pain Score 09/04/17 1009 10     Pain Loc --      Pain Edu? --      Excl. in [place]? --    Constitutional: Alert and oriented.  Mild distress Eyes: Conjunctivae are normal. Normal extraocular movements. ENT   Head: Normocephalic and atraumatic.   Nose: No congestion/rhinnorhea.   Mouth/Throat: Mucous membranes are moist.   Neck: No stridor. Cardiovascular: Normal rate, regular rhythm. No murmurs, rubs, or gallops. Respiratory: Normal respiratory effort without tachypnea nor retractions.  Breath sounds are clear and equal bilaterally. No wheezes/rales/rhonchi. Gastrointestinal: Soft and nontender. Normal bowel sounds Rectal: No blood, extensive fecal impaction and hard stool.  No blood is noted. Musculoskeletal: Nontender with normal range of motion in extremities. No lower extremity tenderness nor edema. Neurologic:  Normal speech and language. No gross focal neurologic deficits are appreciated.  Skin:  Skin is warm, dry and intact. No rash noted. Psychiatric: Mood and affect are normal. Speech and  behavior are normal.  ____________________________________________  ED COURSE:  As part of my medical decision making, I reviewed the following data within the [place] History obtained from family if available, nursing notes, old chart and ekg, as well as notes from prior ED visits. Patient presented for constipation and fecal impaction.  Patient will be disimpacted   Procedures ____________________________________________  DIFFERENTIAL DIAGNOSIS   Fecal impaction, constipation, immobility, dehydration  FINAL ASSESSMENT AND PLAN  Fecal impaction   Plan: Patient had presented for constipation and fecal impaction.  Patient was disimpacted with a considerable amount of stool produced.  He is stable for outpatient follow-up.   Earleen Newport, MD   Note: This note was generated in part or whole with voice recognition software. Voice recognition is usually quite accurate but there are transcription errors that can and very often do occur. I apologize for any typographical errors that were not detected and corrected.     Earleen Newport, MD 09/04/17 1017

## 2017-09-04 NOTE — ED Triage Notes (Addendum)
Pt to ER via [facility] from home c/o constipation. Last BM approx 4 days ago. Pt was discharged from hospital over the weekend. Pt c/o abdominal pain to left abdomen and tenderness to right abdomen with palpation only. Pt alert and oriented X4, active, cooperative, pt in NAD. RR even and unlabored, color WNL.  Denies NVD.

## 2017-09-04 NOTE — Telephone Encounter (Signed)
Patient's wife, Constance Holster, ok per DPR, contacted regarding discharge from [facility] on 09/02/17.   Patient understands to follow up with provider ? On 09/04/17 at 10:30 am at [facility] with Ignacia Bayley, NP.  Patient understands discharge instructions? Yes   Patient understands medications and regiment? Yes   Patient understands to bring all medications to this visit? Yes   Wife states patient has not had a BM in 3 days. She is concerned about this. Currently, [facility] nurse is helping them with this. Patient has been directed by Home health to take medications to help with this. She states she has a call into them this morning as well. Advised her to follow instructions from Home health nurse and call us back if they need to reschedule the appointment. She verbalized understanding.

## 2017-09-04 NOTE — ED Notes (Signed)
Pt alert and oriented X4, active, cooperative, pt in NAD. RR even and unlabored, color WNL.  Pt informed to return if any life threatening symptoms occur.  Discharge and followup instructions reviewed.  

## 2017-09-04 NOTE — Telephone Encounter (Signed)
°*  STAT* If patient is at the pharmacy, call can be transferred to refill team.   1. Which medications need to be refilled? (please list name of each medication and dose if known)    Plavix 75 mg po q day    Metoprolol 12.5 mg po q day      2. Which pharmacy/location (including street and city if local pharmacy) is medication to be sent to?    [facility] [address] [place]   3. Do they need a 30 day or 90 day supply? [place]

## 2017-09-05 MED ORDER — CLOPIDOGREL BISULFATE 75 MG PO TABS: 75.0000 mg | ORAL_TABLET | Freq: Every day | ORAL | 3 refills | Status: AC

## 2017-09-05 MED ORDER — METOPROLOL SUCCINATE ER 25 MG PO TB24: 12.5000 mg | ORAL_TABLET | Freq: Every day | ORAL | 3 refills | Status: DC

## 2017-09-05 NOTE — Telephone Encounter (Signed)
Refills sent in to pharmacy requested.

## 2017-09-12 ENCOUNTER — Other Ambulatory Visit: Payer: Self-pay | Admitting: *Deleted

## 2017-09-12 DIAGNOSIS — D649 Anemia, unspecified: Secondary | ICD-10-CM

## 2017-09-13 ENCOUNTER — Inpatient Hospital Stay: Payer: Medicare Other

## 2017-09-13 ENCOUNTER — Inpatient Hospital Stay (HOSPITAL_BASED_OUTPATIENT_CLINIC_OR_DEPARTMENT_OTHER): Payer: Medicare Other | Admitting: Internal Medicine

## 2017-09-13 ENCOUNTER — Inpatient Hospital Stay: Payer: Medicare Other | Attending: Internal Medicine

## 2017-09-13 VITALS — BP 115/65 | HR 64 | Temp 97.9°F | Resp 20 | Ht 72.0 in | Wt 155.5 lb

## 2017-09-13 DIAGNOSIS — I10 Essential (primary) hypertension: Secondary | ICD-10-CM | POA: Insufficient documentation

## 2017-09-13 DIAGNOSIS — D649 Anemia, unspecified: Secondary | ICD-10-CM

## 2017-09-13 DIAGNOSIS — K5900 Constipation, unspecified: Secondary | ICD-10-CM | POA: Insufficient documentation

## 2017-09-13 DIAGNOSIS — Z7982 Long term (current) use of aspirin: Secondary | ICD-10-CM | POA: Insufficient documentation

## 2017-09-13 DIAGNOSIS — Z79899 Other long term (current) drug therapy: Secondary | ICD-10-CM

## 2017-09-13 DIAGNOSIS — D696 Thrombocytopenia, unspecified: Secondary | ICD-10-CM

## 2017-09-13 DIAGNOSIS — Z87891 Personal history of nicotine dependence: Secondary | ICD-10-CM

## 2017-09-13 DIAGNOSIS — D469 Myelodysplastic syndrome, unspecified: Secondary | ICD-10-CM | POA: Diagnosis present

## 2017-09-13 DIAGNOSIS — I252 Old myocardial infarction: Secondary | ICD-10-CM

## 2017-09-13 DIAGNOSIS — I2510 Atherosclerotic heart disease of native coronary artery without angina pectoris: Secondary | ICD-10-CM | POA: Diagnosis not present

## 2017-09-13 DIAGNOSIS — I350 Nonrheumatic aortic (valve) stenosis: Secondary | ICD-10-CM

## 2017-09-13 DIAGNOSIS — D4620 Refractory anemia with excess of blasts, unspecified: Secondary | ICD-10-CM

## 2017-09-13 DIAGNOSIS — R5383 Other fatigue: Secondary | ICD-10-CM | POA: Insufficient documentation

## 2017-09-13 DIAGNOSIS — E119 Type 2 diabetes mellitus without complications: Secondary | ICD-10-CM | POA: Diagnosis not present

## 2017-09-13 LAB — CBC WITH DIFFERENTIAL/PLATELET
BASOS ABS: 0 10*3/uL (ref 0–0.1)
Basophils Relative: 1 %
Eosinophils Absolute: 0 10*3/uL (ref 0–0.7)
Eosinophils Relative: 3 %
HEMATOCRIT: 24.7 % — AB (ref 40.0–52.0)
HEMOGLOBIN: 8.2 g/dL — AB (ref 13.0–18.0)
LYMPHS PCT: 41 %
Lymphs Abs: 0.6 10*3/uL — ABNORMAL LOW (ref 1.0–3.6)
MCH: 34.7 pg — ABNORMAL HIGH (ref 26.0–34.0)
MCHC: 33 g/dL (ref 32.0–36.0)
MCV: 105.1 fL — ABNORMAL HIGH (ref 80.0–100.0)
Monocytes Absolute: 0.4 10*3/uL (ref 0.2–1.0)
Monocytes Relative: 25 %
NEUTROS ABS: 0.5 10*3/uL — AB (ref 1.4–6.5)
Neutrophils Relative %: 30 %
Platelets: 110 10*3/uL — ABNORMAL LOW (ref 150–440)
RBC: 2.35 MIL/uL — AB (ref 4.40–5.90)
RDW: 18.1 % — ABNORMAL HIGH (ref 11.5–14.5)
WBC: 1.6 10*3/uL — AB (ref 3.8–10.6)

## 2017-09-13 LAB — COMPREHENSIVE METABOLIC PANEL
ALT: 11 U/L — AB (ref 17–63)
AST: 21 U/L (ref 15–41)
Albumin: 3.5 g/dL (ref 3.5–5.0)
Alkaline Phosphatase: 82 U/L (ref 38–126)
Anion gap: 7 (ref 5–15)
BILIRUBIN TOTAL: 0.6 mg/dL (ref 0.3–1.2)
BUN: 21 mg/dL — ABNORMAL HIGH (ref 6–20)
CO2: 26 mmol/L (ref 22–32)
CREATININE: 0.98 mg/dL (ref 0.61–1.24)
Calcium: 9.1 mg/dL (ref 8.9–10.3)
Chloride: 103 mmol/L (ref 101–111)
GFR calc Af Amer: >60 mL/min (ref >60)
Glucose, Bld: 121 mg/dL — ABNORMAL HIGH (ref 65–99)
Potassium: 3.9 mmol/L (ref 3.5–5.1)
Sodium: 136 mmol/L (ref 135–145)
TOTAL PROTEIN: 6.8 g/dL (ref 6.5–8.1)

## 2017-09-13 LAB — IRON AND TIBC
Iron: 120 ug/dL (ref 45–182)
Saturation Ratios: 50 % — ABNORMAL HIGH (ref 17.9–39.5)
TIBC: 242 ug/dL — ABNORMAL LOW (ref 250–450)
UIBC: 123 ug/dL

## 2017-09-13 LAB — SAMPLE TO BLOOD BANK

## 2017-09-13 LAB — FERRITIN: FERRITIN: 503 ng/mL — AB (ref 24–336)

## 2017-09-13 MED ORDER — DARBEPOETIN ALFA 500 MCG/ML IJ SOSY
500.0000 ug | PREFILLED_SYRINGE | INTRAMUSCULAR | Status: DC
  Administered 2017-09-13: 500 ug via SUBCUTANEOUS
  Filled 2017-09-13: qty 1

## 2017-09-13 NOTE — Progress Notes (Signed)
[facility] OFFICE PROGRESS NOTE  Patient Care Team: Zolotor, Audie Clear, MD as PCP - General  Cancer Staging No matching staging information was found for the patient.   Oncology History   DEC 2017- 2017- MDS-anemia prominent- anemia, responsive to darbepoietin, which he started in January 2018. darbepoietin with zyrtec  [Dr.Ma];    # CAD- NSTEMI-jan 2019/ AS [Dr.Gollan]        ------------------------------------------------ --  DEC 2017- Dr.Ma[Bone marrow, right iliac, aspiration and biopsy- -  Hypercellular bone marrow (80%) with trilineage hematopoiesis showing mild morphologic dyspoiesis and no increase in blasts (See Comment; 2% blasts) -  Routine cytogenetic results reveal a normal karyotype; see details below (case [id number]). 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  male pleasant patient above history of history of aortic stenosis CAD-history of MDS diagnosed December 2017-[facility].  [facility] the [facility]  Patient has had multiple recent hospitalizations-most recent non-STEMI; recommended conservative cardiac management.  He was also seen in the emergency room for  constipation; status post disimpaction.  Currently on stool softeners laxatives.  Denies any blood in stools.  Colonoscopy many years ago.  His appetite is fair.  Complains of mild to moderate fatigue especially with exertion.  Denies any worsening swelling of the legs.  REVIEW OF SYSTEMS:  A complete 10 point review of system is done which is negative except mentioned above/history of present illness.   PAST MEDICAL HISTORY :  Past Medical History:  Diagnosis Date  . Aortic stenosis   . [place]   . Hypertension   . [place]     PAST SURGICAL HISTORY :   Past Surgical History:  Procedure Laterality Date  . LEFT HEART CATH AND CORONARY ANGIOGRAPHY N/A 08/16/2017   Procedure: LEFT HEART CATH AND CORONARY ANGIOGRAPHY;  Surgeon: GMinna Merritts MD;  [facility];  Service: Cardiovascular;  Laterality: N/A;    FAMILY HISTORY :   Family History  Problem Relation Age of Onset  . Hypertension Father     SOCIAL HISTORY:   Social History   Tobacco Use  . Smoking status: Former Smoker    Packs/day: 1.00    Types: Cigarettes    Last attempt to quit: 08/13/1968    Years since quitting: 49.1  . Smokeless tobacco: Never Used  Substance Use Topics  . Alcohol use: No    Frequency: Never  . Drug use: No    ALLERGIES:  is allergic to penicillins.  MEDICATIONS:  Current Outpatient Medications  Medication Sig Dispense Refill  . albuterol (PROVENTIL HFA;VENTOLIN HFA) 108 (90 Base) MCG/ACT inhaler Inhale 2 puffs into the lungs every 6 (six) hours as needed for wheezing or shortness of breath.  1 Inhaler 2  . aspirin EC 81 MG EC tablet Take 1 tablet (81 mg total) by mouth daily. 30 tablet 0  . bisacodyl (DULCOLAX) 5 MG EC tablet Take 1 tablet (5 mg total) by mouth daily as needed for moderate constipation. 30 tablet 0  . clopidogrel (PLAVIX) 75 MG tablet Take 1 tablet (75 mg total) by mouth daily. 90 tablet 3  .  Darbepoetin Alfa (ARANESP) 500 MCG/ML SOSY injection Inject 500 mcg into the skin every 30 (thirty) days.    Marland Kitchen docusate sodium (COLACE) 100 MG capsule Take 1 capsule (100 mg total) by mouth daily as needed. 30 capsule 2  . isosorbide mononitrate (IMDUR) 30 MG 24 hr tablet Take 1 tablet (30 mg total) by mouth daily. 30 tablet 1  . lisinopril (PRINIVIL,ZESTRIL) 10 MG tablet Take 1 tablet (10 mg total) by mouth daily. 30 tablet 1  . metoprolol succinate (TOPROL-XL) 25 MG 24 hr tablet Take 0.5 tablets (12.5 mg total) by mouth daily. 45 tablet 3  . Multiple Vitamin (MULTI-VITAMINS) TABS Take 1 tablet by mouth daily.    Marland Kitchen senna-docusate (SENOKOT-S) 8.6-50 MG tablet Take 2 tablets by mouth daily as needed for mild constipation. 30 tablet 1  . simvastatin (ZOCOR) 10 MG tablet Take 10 mg by mouth daily.    Marland Kitchen triamcinolone cream (KENALOG) 0.1 % Apply 1 application topically 2 (two) times daily.    . nitroGLYCERIN (NITROSTAT) 0.4 MG SL tablet Place 1 tablet (0.4 mg total) under the tongue every 5 (five) minutes as needed for chest pain. (Patient not taking: Reported on 09/13/2017) 25 tablet 3   No current facility-administered medications for this visit.    Facility-Administered Medications Ordered in Other Visits  Medication Dose Route Frequency Provider Last Rate Last Dose  . Darbepoetin Alfa (ARANESP) injection 500 mcg  500 mcg Subcutaneous Weekly Charlaine Dalton R, MD   500 mcg at 09/13/17 1123    PHYSICAL EXAMINATION: ECOG PERFORMANCE STATUS: 1 - Symptomatic but completely ambulatory  BP 115/65 (BP Location: Right Arm, Patient Position: Sitting)   Pulse 64   Temp 97.9 F (36.6 C) (Tympanic)   Resp 20   Ht 6' (1.829 m)   Wt 155 lb 8 oz (70.5 kg)   BMI 21.09 kg/m   Filed Weights   09/13/17 1020  Weight: 155 lb 8 oz (70.5 kg)    GENERAL: Well-nourished well-developed; Alert, no distress and comfortable.   He is in a wheelchair.  Accompanied by his wife. EYES: no pallor or  icterus OROPHARYNX: no thrush or ulceration; good dentition  NECK: supple, no masses felt LYMPH:  no palpable lymphadenopathy in the cervical, axillary or inguinal regions LUNGS: clear to auscultation and  No wheeze or crackles HEART/CVS: regular rate & rhythm and no murmurs; No lower extremity edema ABDOMEN:abdomen soft, non-tender and normal bowel sounds Musculoskeletal:no cyanosis of digits and no clubbing  PSYCH: alert & oriented x 3 with fluent speech NEURO: no focal motor/sensory deficits SKIN:  no rashes or significant lesions  LABORATORY DATA:  I have reviewed the data as listed    Component Value Date/Time   NA 136 09/13/2017 1010   NA 140 05/27/2014 1418   K 3.9 09/13/2017 1010   K 3.8 05/27/2014 1418   CL 103 09/13/2017 1010   CL 106 05/27/2014 1418   CO2 26 09/13/2017 1010   CO2 28 05/27/2014 1418   GLUCOSE 121 (H) 09/13/2017 1010   GLUCOSE 166 (H) 05/27/2014 1418   BUN 21 (H)  09/13/2017 1010   BUN 18 05/27/2014 1418   CREATININE 0.98 09/13/2017 1010   CREATININE 1.24 05/27/2014 1418   CALCIUM 9.1 09/13/2017 1010   CALCIUM 9.1 05/27/2014 1418   PROT 6.8 09/13/2017 1010   PROT 7.2 05/27/2014 1418   ALBUMIN 3.5 09/13/2017 1010   ALBUMIN 3.5 05/27/2014 1418   AST 21 09/13/2017 1010   AST 25 05/27/2014 1418   ALT 11 (L) 09/13/2017 1010   ALT 23 05/27/2014 1418   ALKPHOS 82 09/13/2017 1010   ALKPHOS 113 05/27/2014 1418   BILITOT 0.6 09/13/2017 1010   BILITOT 0.7 05/27/2014 1418   GFRNONAA >60 09/13/2017 1010   GFRNONAA 59 (L) 05/27/2014 1418   GFRAA >60 09/13/2017 1010   GFRAA >60 05/27/2014 1418    No results found for: SPEP, UPEP  Lab Results  Component Value Date   WBC 1.6 (L) 09/13/2017   NEUTROABS 0.5 (L) 09/13/2017   HGB 8.2 (L) 09/13/2017   HCT 24.7 (L) 09/13/2017   MCV 105.1 (H) 09/13/2017   PLT 110 (L) 09/13/2017      Chemistry      Component Value Date/Time   NA 136 09/13/2017 1010   NA 140 05/27/2014 1418   K 3.9 09/13/2017 1010    K 3.8 05/27/2014 1418   CL 103 09/13/2017 1010   CL 106 05/27/2014 1418   CO2 26 09/13/2017 1010   CO2 28 05/27/2014 1418   BUN 21 (H) 09/13/2017 1010   BUN 18 05/27/2014 1418   CREATININE 0.98 09/13/2017 1010   CREATININE 1.24 05/27/2014 1418      Component Value Date/Time   CALCIUM 9.1 09/13/2017 1010   CALCIUM 9.1 05/27/2014 1418   ALKPHOS 82 09/13/2017 1010   ALKPHOS 113 05/27/2014 1418   AST 21 09/13/2017 1010   AST 25 05/27/2014 1418   ALT 11 (L) 09/13/2017 1010   ALT 23 05/27/2014 1418   BILITOT 0.6 09/13/2017 1010   BILITOT 0.7 05/27/2014 1418       RADIOGRAPHIC STUDIES: I have personally reviewed the radiological images as listed and agreed with the findings in the report. No results found.   ASSESSMENT & PLAN:  MDS (myelodysplastic syndrome), low grade (HCC) # MDS-predominant anemia; moderate leukopenia ANC 500; moderate thrombocytopenia at 100 platelet.  Continue aranesp every 3 weeks.  Awaiting iron studies.  If low recommend IV Venofer.   # CAD- s/p recent NSTEMI- s/p PRBC transfusion approximately 2 weeks ago.  # constipation- has intermittent chronic; colonoscopy many years ago.  Question malignancy; however hold off further workup given his recent MI/age.  # cbc/aranesp in 3 weeks; and again in 6 weeks/ MD/labs/aranesp.   C; Dr.Zolotor.    Orders Placed This Encounter  Procedures  . CBC with Differential/Platelet    Standing Status:   Standing    Number of Occurrences:   10    Standing Expiration Date:   09/13/2018   All questions were answered. The patient knows to call the clinic with any problems, questions or concerns.      Cammie Sickle, MD 09/13/2017 12:11 PM

## 2017-09-13 NOTE — Assessment & Plan Note (Addendum)
#   MDS-predominant anemia; moderate leukopenia ANC 500; moderate thrombocytopenia at 100 platelet.  Continue aranesp every 3 weeks.  Awaiting iron studies.  If low recommend IV Venofer.   # CAD- s/p recent NSTEMI- s/p PRBC transfusion approximately 2 weeks ago.  # constipation- has intermittent chronic; colonoscopy many years ago.  Question malignancy; however hold off further workup given his recent MI/age.  # cbc/aranesp in 3 weeks; and again in 6 weeks/ MD/labs/aranesp.   C; Dr.Zolotor.

## 2017-09-19 ENCOUNTER — Encounter: Payer: Self-pay | Admitting: Physician Assistant

## 2017-09-19 NOTE — Progress Notes (Signed)
Cardiology Office Note Date:  09/23/2017  Patient ID:  Terry Gonzales, DOB 1925/01/08, MRN [id number] PCP:  Zolotor, Audie Clear, MD  Cardiologist:  Dr. Rockey Situ, MD    Chief Complaint: Hospital follow-up  History of Present Illness: Terry Gonzales is a 84 y.o. male with history of severe three-vessel coronary artery disease with recent NSTEMI medically managed, severe systolic heart failure secondary to ischemic cardiomyopathy, at least moderate aortic stenosis, hypertension, diabetes mellitus, and myelodysplastic syndrome who presents for hospital follow-up from admission to [facility] from 1/20 through 1/21 for chest pain.  He was hospitalized at [facility] from 1/2 through 08/19/17 for pneumonia complicated by NSTEMI and acute kidney injury. Presented with chest pain and productive cough times 3 days. Chest radiograph was suspicious for bilateral pneumonia (though in retrospect question component of heart failure), as well as elevated troponin that ultimately peaked at 25.8. Echo on 08/15/17 showed an EF of 30-35%, hypokinesis of the anterior, anteroseptal, and apical myocardium, Gr1DD, moderate aortic stenosis (may be under estimated due to his cardiomyopathy), RV systolic function normal, left atrium normal size, unable to estimate PASP. LHC 08/16/17 showed severe, heavily calcified 3-vessel CAD with ostial LAD 75%, proximal LAD 80%, ostial LCx 75%, mid LCX 75%, OM2 80%, ostial RCA 99%, mid RCA 95%, distal RCA 99%. There were no optimal PCI targets and given his advanced age and comorbid conditions, he was not felt to be a good candidate for CABG. Medical therapy was advised. He was seen in the ED on 08/24/17 with SOB. Troponin 0.55. CXR showed COPD without active disease. EKG showed NSR, nonspecific anterior st elevation, diffuse TWI. He was discharged to outpatient follow up. He was re-admitted 1/20-1/21 for chest pain. Troponin peaked at 0.04. HGB was noted to have down trended to 8.3 and he was transfused 1 unit of  pRBC with a discharge HGB of 9.0. Imdur was added. He was continued on DAPT as no obvious source of bleed was noted. He was most recently seen in the ED on 1/23 for fecal impaction requiring disimpaction. He was seen by hematology 09/13/17 with WBC 1.6, HGB 8.2, PLT 110, Na 136, K+ 3.9, SCr 0.98.   He comes in today doing well from a cardiac perspective.  He has been working with PT without issues.  Over the past week he has noted a thoracic, right-sided back pain that is intermittent in nature and appears to be exacerbated with movement.  He also notes some posterior neck pain.  No chest pain or shortness of breath.  No palpitations.  He does note some positional dizziness that improves after several seconds.  He appears to be tolerating medications without issues.  No BRBPR, or melena.  Has a good appetite at home.  Able to finish meals.  Per patient's wife, weight has increased 4 pounds within the past week.  Patient feels like this is secondary to food intake and not fluid.   Past Medical History:  Diagnosis Date  . Aortic stenosis   . CAD (coronary artery disease)    a. NSTEMI 1/19 - LHC 1/19: ostial LAD 75%, proximal LAD 80%, ostial LCx 75%, mid LCX 75%, OM2 80%, ostial RCA 99%, mid RCA 95%, distal RCA 99%, med Rx  . Diabetes mellitus without complication ([place])   . Hypertension   . Ischemic cardiomyopathy    a. TTE 1/19: EF of 30-35%, hypokinesis of the anterior, anteroseptal, and apical myocardium, Gr1DD, moderate aortic stenosis (may be under estimated due to his cardiomyopathy), RV systolic function  normal, left atrium normal size, unable to estimate PASP  . Myelodysplastic syndrome [facility])     Past Surgical History:  Procedure Laterality Date  . LEFT HEART CATH AND CORONARY ANGIOGRAPHY N/A 08/16/2017   Procedure: LEFT HEART CATH AND CORONARY ANGIOGRAPHY;  Surgeon: Minna Merritts, MD;  Location: [facility];  Service: Cardiovascular;  Laterality: N/A;    Current Meds    Medication Sig  . albuterol (PROVENTIL HFA;VENTOLIN HFA) 108 (90 Base) MCG/ACT inhaler Inhale 2 puffs into the lungs every 6 (six) hours as needed for wheezing or shortness of breath.  Marland Kitchen aspirin EC 81 MG EC tablet Take 1 tablet (81 mg total) by mouth daily.  . bisacodyl (DULCOLAX) 5 MG EC tablet Take 1 tablet (5 mg total) by mouth daily as needed for moderate constipation.  . clopidogrel (PLAVIX) 75 MG tablet Take 1 tablet (75 mg total) by mouth daily.  . Darbepoetin Alfa (ARANESP) 500 MCG/ML SOSY injection Inject 500 mcg into the skin every 30 (thirty) days.  Marland Kitchen docusate sodium (COLACE) 100 MG capsule Take 1 capsule (100 mg total) by mouth daily as needed.  . isosorbide mononitrate (IMDUR) 30 MG 24 hr tablet Take 1 tablet (30 mg total) by mouth daily.  Marland Kitchen lisinopril (PRINIVIL,ZESTRIL) 10 MG tablet Take 1 tablet (10 mg total) by mouth daily.  . metoprolol succinate (TOPROL-XL) 25 MG 24 hr tablet Take 0.5 tablets (12.5 mg total) by mouth daily.  . Multiple Vitamin (MULTI-VITAMINS) TABS Take 1 tablet by mouth daily.  . nitroGLYCERIN (NITROSTAT) 0.4 MG SL tablet Place 1 tablet (0.4 mg total) under the tongue every 5 (five) minutes as needed for chest pain.  Marland Kitchen senna-docusate (SENOKOT-S) 8.6-50 MG tablet Take 2 tablets by mouth daily as needed for mild constipation.  . simvastatin (ZOCOR) 10 MG tablet Take 10 mg by mouth daily.  Marland Kitchen triamcinolone cream (KENALOG) 0.1 % Apply 1 application topically 2 (two) times daily.    Allergies:   Penicillins   Social History:  The patient  reports that he quit smoking about 49 years ago. His smoking use included cigarettes. He smoked 1.00 pack per day. he has never used smokeless tobacco. He reports that he does not drink alcohol or use drugs.   Family History:  The patient's family history includes Hypertension in his father.  ROS:   Review of Systems  Constitutional: Positive for malaise/fatigue. Negative for chills, diaphoresis, fever and weight loss.   HENT: Negative for congestion.   Eyes: Negative for discharge and redness.  Respiratory: Negative for cough, hemoptysis, sputum production, shortness of breath and wheezing.   Cardiovascular: Negative for chest pain, palpitations, orthopnea, claudication, leg swelling and PND.  Gastrointestinal: Negative for abdominal pain, blood in stool, heartburn, melena, nausea and vomiting.  Genitourinary: Negative for hematuria.  Musculoskeletal: Positive for back pain and neck pain. Negative for falls and myalgias.  Skin: Negative for rash.  Neurological: Positive for dizziness and weakness. Negative for tingling, tremors, sensory change, speech change, focal weakness and loss of consciousness.  Endo/Heme/Allergies: Does not bruise/bleed easily.  Psychiatric/Behavioral: Negative for substance abuse. The patient is not nervous/anxious.   All other systems reviewed and are negative.    PHYSICAL EXAM:  VS:  BP 128/60 (BP Location: Left Arm, Patient Position: Sitting, Cuff Size: Normal)   Pulse (!) 55   Ht 6' (1.829 m)   Wt 159 lb (72.1 kg)   BMI 21.56 kg/m  BMI: Body mass index is 21.56 kg/m.  Physical Exam  Constitutional:  He is oriented to person, place, and time. He appears well-developed and well-nourished.  HENT:  Head: Normocephalic and atraumatic.  Eyes: Right eye exhibits no discharge. Left eye exhibits no discharge.  Neck: Normal range of motion. No JVD present.  Cardiovascular: Normal rate, regular rhythm, S1 normal and S2 normal. Exam reveals no distant heart sounds, no friction rub, no midsystolic click and no opening snap.  Murmur heard.  Harsh midsystolic murmur is present with a grade of 3/6 at the upper right sternal border radiating to the neck. Pulses:      Posterior tibial pulses are 1+ on the right side, and 1+ on the left side.  Pulmonary/Chest: Effort normal and breath sounds normal. No respiratory distress. He has no decreased breath sounds. He has no wheezes. He has no  rales. He exhibits no tenderness.  Abdominal: Soft. He exhibits no distension. There is no tenderness.  Musculoskeletal: He exhibits no edema.  Examination of the posterior chest reveals no erythema, wound, bruising, or trauma.  Palpation of the right side of the back does not reproduce patient's pain.  Neurological: He is alert and oriented to person, place, and time.  Skin: Skin is warm and dry. No cyanosis. Nails show no clubbing.  Psychiatric: He has a normal mood and affect. His speech is normal and behavior is normal. Judgment and thought content normal.     EKG:  Was ordered and interpreted by me today. Shows sinus bradycardia, 55 bpm, left axis deviation, TWI III, aVF, V3-V4  Recent Labs: 08/17/2017: Magnesium 2.1 09/01/2017: B Natriuretic Peptide 183.0 09/13/2017: ALT 11; BUN 21; Creatinine, Ser 0.98; Hemoglobin 8.2; Platelets 110; Potassium 3.9; Sodium 136  09/01/2017: Cholesterol 102; HDL 35; LDL Cholesterol 56; Total CHOL/HDL Ratio 2.9; Triglycerides 53; VLDL 11   Estimated Creatinine Clearance: 51.1 mL/min (by C-G formula based on SCr of 0.98 mg/dL).   Wt Readings from Last 3 Encounters:  09/23/17 159 lb (72.1 kg)  09/13/17 155 lb 8 oz (70.5 kg)  09/04/17 150 lb (68 kg)     Other studies reviewed: Additional studies/records reviewed today include: summarized above  ASSESSMENT AND PLAN:  1. CAD in native coronary artery without angina: No symptoms concerning for chest pain at this time.  Recent left heart cath in 08/2017 as above showing severe three-vessel coronary artery disease not amenable to PCI or bypass.  Continue medical management with ASA and Plavix.  Continue isosorbide mononitrate 30 mg daily.  Toprol has been changed to carvedilol as detailed below given patient's sinus bradycardia.  Consider outpatient cardiac rehab and follow-up.  Continue simvastatin.  No plans for further ischemic evaluation at this time.  2. ICM: He does not appear grossly volume overloaded at  this time.  Change Toprol to Coreg 3.125 for less heart rate effect.  Continue lisinopril.  Could consider addition of spironolactone in follow-up.  CHF education.  Daily weights.  3. At least moderate aortic stenosis: He notes some positional dizziness.  He is unlikely to be a candidate for invasive valvular workup given his advanced age and comorbid conditions.  Continue to follow clinically.  He is significantly volume dependent.  4. HTN: Well-controlled.  Continue current medications as above.  5. MDS: Per hematology.  6. Back pain: Currently pain-free.  Recommend that patient contact PCP for further evaluation.  Disposition: F/u with Dr. Rockey Situ in 1 month.  Current medicines are reviewed at length with the patient today.  The patient did not have any concerns regarding medicines.  Signed, Thurmond Butts  Idolina Primer, PA-C 09/23/2017 2:34 PM     [place], [place] [address] [phone]

## 2017-09-23 ENCOUNTER — Encounter: Payer: Self-pay | Admitting: Physician Assistant

## 2017-09-23 ENCOUNTER — Ambulatory Visit: Payer: Medicare Other | Admitting: Physician Assistant

## 2017-09-23 VITALS — BP 128/60 | HR 55 | Ht 72.0 in | Wt 159.0 lb

## 2017-09-23 DIAGNOSIS — I5022 Chronic systolic (congestive) heart failure: Secondary | ICD-10-CM | POA: Diagnosis not present

## 2017-09-23 DIAGNOSIS — I10 Essential (primary) hypertension: Secondary | ICD-10-CM | POA: Diagnosis not present

## 2017-09-23 DIAGNOSIS — I2510 Atherosclerotic heart disease of native coronary artery without angina pectoris: Secondary | ICD-10-CM

## 2017-09-23 DIAGNOSIS — I350 Nonrheumatic aortic (valve) stenosis: Secondary | ICD-10-CM

## 2017-09-23 DIAGNOSIS — I255 Ischemic cardiomyopathy: Secondary | ICD-10-CM | POA: Diagnosis not present

## 2017-09-23 DIAGNOSIS — D4620 Refractory anemia with excess of blasts, unspecified: Secondary | ICD-10-CM

## 2017-09-23 DIAGNOSIS — M546 Pain in thoracic spine: Secondary | ICD-10-CM | POA: Diagnosis not present

## 2017-09-23 MED ORDER — CARVEDILOL 3.125 MG PO TABS: 3.1250 mg | ORAL_TABLET | Freq: Two times a day (BID) | ORAL | 3 refills | Status: AC

## 2017-09-23 NOTE — Patient Instructions (Addendum)
Medication Instructions:  Your physician has recommended you make the following change in your medication:  1. STOP taking toprol (Metoprolol) 2. START Carvediolol 3.125 mg twice a day  Follow-Up: Your physician recommends that you schedule a follow-up appointment in: 1 month with Dr. Rockey Situ.  It was a pleasure seeing you today here in the office. Please do not hesitate to give [place] a call back if you have any further questions. [place], BSN   +

## 2017-10-04 ENCOUNTER — Telehealth: Payer: Self-pay | Admitting: *Deleted

## 2017-10-04 ENCOUNTER — Inpatient Hospital Stay: Payer: Medicare Other

## 2017-10-04 VITALS — BP 101/53 | HR 54

## 2017-10-04 DIAGNOSIS — D4620 Refractory anemia with excess of blasts, unspecified: Secondary | ICD-10-CM

## 2017-10-04 DIAGNOSIS — D469 Myelodysplastic syndrome, unspecified: Secondary | ICD-10-CM | POA: Diagnosis not present

## 2017-10-04 LAB — CBC WITH DIFFERENTIAL/PLATELET
Basophils Absolute: 0 10*3/uL (ref 0–0.1)
Basophils Relative: 1 %
EOS ABS: 0 10*3/uL (ref 0–0.7)
Eosinophils Relative: 1 %
HCT: 25.5 % — ABNORMAL LOW (ref 40.0–52.0)
HEMOGLOBIN: 8.7 g/dL — AB (ref 13.0–18.0)
Lymphocytes Relative: 50 %
Lymphs Abs: 0.8 10*3/uL — ABNORMAL LOW (ref 1.0–3.6)
MCH: 36.5 pg — ABNORMAL HIGH (ref 26.0–34.0)
MCHC: 34 g/dL (ref 32.0–36.0)
MCV: 107.2 fL — ABNORMAL HIGH (ref 80.0–100.0)
Monocytes Absolute: 0.5 10*3/uL (ref 0.2–1.0)
Monocytes Relative: 29 %
NEUTROS PCT: 19 %
Neutro Abs: 0.3 10*3/uL — ABNORMAL LOW (ref 1.4–6.5)
PLATELETS: 91 10*3/uL — AB (ref 150–440)
RBC: 2.38 MIL/uL — AB (ref 4.40–5.90)
RDW: 19.8 % — ABNORMAL HIGH (ref 11.5–14.5)
WBC: 1.6 10*3/uL — AB (ref 3.8–10.6)

## 2017-10-04 MED ORDER — DARBEPOETIN ALFA 500 MCG/ML IJ SOSY
500.0000 ug | PREFILLED_SYRINGE | INTRAMUSCULAR | Status: DC
  Administered 2017-10-04: 500 ug via SUBCUTANEOUS
  Filled 2017-10-04: qty 1

## 2017-10-04 NOTE — Telephone Encounter (Signed)
Lab called Critical ANC 0.3

## 2017-10-14 ENCOUNTER — Other Ambulatory Visit: Payer: Self-pay

## 2017-10-14 MED ORDER — ISOSORBIDE MONONITRATE ER 30 MG PO TB24: 30.0000 mg | ORAL_TABLET | Freq: Every day | ORAL | 3 refills | Status: DC

## 2017-10-14 MED ORDER — LISINOPRIL 10 MG PO TABS: 10.0000 mg | ORAL_TABLET | Freq: Every day | ORAL | 3 refills | Status: DC

## 2017-10-14 NOTE — Telephone Encounter (Signed)
*  STAT* If patient is at the pharmacy, call can be transferred to refill team.   1. Which medications need to be refilled? (please list name of each medication and dose if known) isosorbide and lisinopril  2. Which pharmacy/location (including street and city if local pharmacy) is medication to be sent to? [facility] [address]  3. Do they need a 30 day or 90 day supply? 90 day

## 2017-10-19 NOTE — Progress Notes (Signed)
Cardiology Office Note  Date:  10/21/2017   ID:  Terry Gonzales, DOB [birth year], MRN [id number]  PCP:  Zolotor, Audie Clear, MD   Chief Complaint  Patient presents with  . other    1 month follow up. Meds reviewed by the pt. verbally. Pt. c/o shortness of breath and chest pain.     HPI:  Terry Gonzales is a 84 year old gentleman with past medical history of MDS, hematocrit 25 Moderate to severe aortic stenosis Ejection fraction 30-35% Severe three-vessel coronary disease Acute renal failure Bilateral pneumonia  Presents for follow-up after recent hospitalization after NSTEMI, managed medically  hospitalization January 2019 with pneumonia, respiratory distress, renal failure, non-STEMI Troponin up to 25 Cardiac catheterization performed as below  severe three-vessel disease, not a candidate for CABG at the time  In follow-up today reports having very occasional chest pain symptoms Takes nitro which usually relieves his symptoms  Denies any further shortness of breath, coughing pneumonia bronchitis Walking some, walks with a walker Good days and bad days Presents today with a wheelchair  No specific new complaints Legs are weak but slowly getting better Eating more weight is trending higher Lost significant weight when he had pneumonia  Chronic back pain Wife reports that he is doing too much like moving garbage cans  Having allergies On previous office visit blood pressure was running low  EKG personally reviewed by myself on todays visit Shows sinus rhythm rate 64 bpm nonspecific ST and T wave abnormality anterior precordial leads, left axis deviation  Other records reviewed with him in detail  Cath 08/16/2017 Severe three vessel disease, ostial LAD, ostial LCX, occluded RCA Also with significant aortic valve stenosis, Depressed EF on echo 30 to 35% Left anterior descending (LAD): Large vessel that extends to the apical region, diagonal branch 2 of moderate size, severe  ostial and proximal disease  Left circumflex (LCx): Large vessel with OM branch 2, moderate to severe ostial disease, severe OM1 and OM2 disease  Right coronary artery (RCA): Right dominant vessel , subtotally occluded ostially, severe mid vessel disease, extasia distal vessel, collaterals from left to right, likely a CTO  Left ventriculography: unable to cross the aortic valve secondary to dilated root, heavy aortic valve calcification, likely moderate to severe, possibly severe aortic valve stenosis   PMH:   has a past medical history of Aortic stenosis, CAD (coronary artery disease), Diabetes mellitus without complication ([place]), Hypertension, Ischemic cardiomyopathy, and Myelodysplastic syndrome ([place]).  PSH:    Past Surgical History:  Procedure Laterality Date  . LEFT HEART CATH AND CORONARY ANGIOGRAPHY N/A 08/16/2017   Procedure: LEFT HEART CATH AND CORONARY ANGIOGRAPHY;  Surgeon: Minna Merritts, MD;  Location: [facility];  Service: Cardiovascular;  Laterality: N/A;    Current Outpatient Medications  Medication Sig Dispense Refill  . albuterol (PROVENTIL HFA;VENTOLIN HFA) 108 (90 Base) MCG/ACT inhaler Inhale 2 puffs into the lungs every 6 (six) hours as needed for wheezing or shortness of breath. 1 Inhaler 2  . aspirin EC 81 MG EC tablet Take 1 tablet (81 mg total) by mouth daily. 30 tablet 0  . bisacodyl (DULCOLAX) 5 MG EC tablet Take 1 tablet (5 mg total) by mouth daily as needed for moderate constipation. 30 tablet 0  . carvedilol (COREG) 3.125 MG tablet Take 1 tablet (3.125 mg total) by mouth 2 (two) times daily. 180 tablet 3  . clopidogrel (PLAVIX) 75 MG tablet Take 1 tablet (75 mg total) by mouth daily. 90 tablet 3  .  Darbepoetin Alfa (ARANESP) 500 MCG/ML SOSY injection Inject 500 mcg into the skin every 30 (thirty) days.    Marland Kitchen docusate sodium (COLACE) 100 MG capsule Take 1 capsule (100 mg total) by mouth daily as needed. 30 capsule 2  . isosorbide mononitrate (IMDUR)  30 MG 24 hr tablet Take 1 tablet (30 mg total) by mouth daily. 30 tablet 3  . lisinopril (PRINIVIL,ZESTRIL) 10 MG tablet Take 1 tablet (10 mg total) by mouth daily. 30 tablet 3  . Multiple Vitamin (MULTI-VITAMINS) TABS Take 1 tablet by mouth daily.    . nitroGLYCERIN (NITROSTAT) 0.4 MG SL tablet Place 1 tablet (0.4 mg total) under the tongue every 5 (five) minutes as needed for chest pain. 25 tablet 3  . senna-docusate (SENOKOT-S) 8.6-50 MG tablet Take 2 tablets by mouth daily as needed for mild constipation. 30 tablet 1  . simvastatin (ZOCOR) 10 MG tablet Take 10 mg by mouth daily.    Marland Kitchen triamcinolone cream (KENALOG) 0.1 % Apply 1 application topically 2 (two) times daily.     No current facility-administered medications for this visit.      Allergies:   Penicillins   Social History:  The patient  reports that he quit smoking about 49 years ago. His smoking use included cigarettes. He smoked 1.00 pack per day. he has never used smokeless tobacco. He reports that he does not drink alcohol or use drugs.   Family History:   family history includes Hypertension in his father.    Review of Systems: Review of Systems  Constitutional: Positive for malaise/fatigue.  Respiratory: Negative.   Cardiovascular: Negative.   Gastrointestinal: Negative.   Musculoskeletal: Negative.        Fall risk, gait instability  Psychiatric/Behavioral: Negative.   All other systems reviewed and are negative.    PHYSICAL EXAM: VS:  BP (!) 120/48 (BP Location: Left Arm, Patient Position: Sitting, Cuff Size: Normal)   Pulse 64   Ht 6' (1.829 m)   Wt 161 lb 8 oz (73.3 kg)   BMI 21.90 kg/m  , BMI Body mass index is 21.9 kg/m. Constitutional:  oriented to person, place, and time. No distress.  HENT:  Head: Normocephalic and atraumatic.  Eyes:  no discharge. No scleral icterus.  Neck: Normal range of motion. Neck supple. No JVD present.  Cardiovascular: Cardiac: RRR; 2+ SEM RSB,  No rubs, or gallops,no  edema  Pulmonary/Chest: Effort normal and breath sounds normal. No stridor. No respiratory distress.  no wheezes.  no rales.  no tenderness.  Abdominal: Soft.  no distension.  no tenderness.  Musculoskeletal: Normal range of motion.  no  tenderness or deformity.  Neurological:  normal muscle tone. Coordination normal. No atrophy Skin: Skin is warm and dry. No rash noted. not diaphoretic.  Psychiatric:  normal mood and affect. behavior is normal. Thought content normal.   Recent Labs: 08/17/2017: Magnesium 2.1 09/01/2017: B Natriuretic Peptide 183.0 09/13/2017: ALT 11; BUN 21; Creatinine, Ser 0.98; Potassium 3.9; Sodium 136 10/04/2017: Hemoglobin 8.7; Platelets 91    Lipid Panel Lab Results  Component Value Date   CHOL 102 09/01/2017   HDL 35 (L) 09/01/2017   LDLCALC 56 09/01/2017   TRIG 53 09/01/2017      Wt Readings from Last 3 Encounters:  10/21/17 161 lb 8 oz (73.3 kg)  09/23/17 159 lb (72.1 kg)  09/13/17 155 lb 8 oz (70.5 kg)       ASSESSMENT AND PLAN:  NSTEMI (non-ST elevated myocardial infarction) ([place]) - Plan: EKG  12-Lead cardiac catheterization with severe three-vessel coronary disease Nitroglycerin provided for any anginal symptoms Stay on aspirin and Plavix Still weak Long discussion concerning anginal symptoms Not a good candidate for catheterization or bypass surgery at this time We will continue medical management  Acute combined systolic and diastolic heart failure ([place]) - Plan: EKG 12-Lead Appears euvolemic No changes to his medications  Severe aortic stenosis - Plan: EKG 12-Lead Low ejection fraction likely underestimating aortic valve stenosis on prior echo Would continue medical management at this time, repeat echocardiogram in follow-up Poor candidate for transesophageal echo  Currently denies significant symptoms  MDS (myelodysplastic syndrome), low grade ([place]) - Plan: EKG 12-Lead  followed by oncology  Hematocrit 25, WBCs 1.6 Trending down from  January Ideally would like hemoglobin 10 or higher given significant coronary disease   Total encounter time more than 25 minutes  Greater than 50% was spent in counseling and coordination of care with the patient  Disposition:   F/U  6 months   Orders Placed This Encounter  Procedures  . EKG 12-Lead     Signed, Esmond Plants, M.D., Ph.D. 10/21/2017  [place], [place]

## 2017-10-21 ENCOUNTER — Encounter: Payer: Self-pay | Admitting: Cardiovascular Disease

## 2017-10-21 ENCOUNTER — Ambulatory Visit: Payer: Medicare Other | Admitting: Cardiovascular Disease

## 2017-10-21 VITALS — BP 120/48 | HR 64 | Ht 72.0 in | Wt 161.5 lb

## 2017-10-21 DIAGNOSIS — I5022 Chronic systolic (congestive) heart failure: Secondary | ICD-10-CM

## 2017-10-21 DIAGNOSIS — D4620 Refractory anemia with excess of blasts, unspecified: Secondary | ICD-10-CM

## 2017-10-21 DIAGNOSIS — I10 Essential (primary) hypertension: Secondary | ICD-10-CM | POA: Diagnosis not present

## 2017-10-21 DIAGNOSIS — I5041 Acute combined systolic (congestive) and diastolic (congestive) heart failure: Secondary | ICD-10-CM

## 2017-10-21 DIAGNOSIS — I350 Nonrheumatic aortic (valve) stenosis: Secondary | ICD-10-CM | POA: Diagnosis not present

## 2017-10-21 DIAGNOSIS — I2510 Atherosclerotic heart disease of native coronary artery without angina pectoris: Secondary | ICD-10-CM | POA: Insufficient documentation

## 2017-10-21 DIAGNOSIS — I25118 Atherosclerotic heart disease of native coronary artery with other forms of angina pectoris: Secondary | ICD-10-CM | POA: Diagnosis not present

## 2017-10-21 NOTE — Patient Instructions (Addendum)
Medication Instructions:   For heartburn Take rolaids And ok to take zantac/pepcid   For allergy Take 1/2 or whole of zyrtec (certirazine)   Labwork:  No new labs needed  Testing/Procedures:  No further testing at this time   Follow-Up: It was a pleasure seeing you in the office today. Please call us if you have new issues that need to be addressed before your next appt.  [phone]  Your physician wants you to follow-up in: 6 months.  You will receive a reminder letter in the mail two months in advance. If you don't receive a letter, please call our office to schedule the follow-up appointment.  If you need a refill on your cardiac medications before your next appointment, please call your pharmacy.  For educational health videos Log in to : [email or web address] Or : [email or web address], password : [email or web address]

## 2017-10-25 ENCOUNTER — Inpatient Hospital Stay: Payer: Medicare Other

## 2017-10-25 ENCOUNTER — Inpatient Hospital Stay (HOSPITAL_BASED_OUTPATIENT_CLINIC_OR_DEPARTMENT_OTHER): Payer: Medicare Other | Admitting: Internal Medicine

## 2017-10-25 ENCOUNTER — Inpatient Hospital Stay: Payer: Medicare Other | Attending: Internal Medicine

## 2017-10-25 ENCOUNTER — Encounter: Payer: Self-pay | Admitting: Internal Medicine

## 2017-10-25 ENCOUNTER — Other Ambulatory Visit: Payer: Self-pay

## 2017-10-25 VITALS — BP 124/65 | HR 58 | Temp 98.0°F | Resp 20 | Ht 72.0 in | Wt 161.0 lb

## 2017-10-25 DIAGNOSIS — E119 Type 2 diabetes mellitus without complications: Secondary | ICD-10-CM | POA: Insufficient documentation

## 2017-10-25 DIAGNOSIS — I10 Essential (primary) hypertension: Secondary | ICD-10-CM | POA: Insufficient documentation

## 2017-10-25 DIAGNOSIS — I252 Old myocardial infarction: Secondary | ICD-10-CM

## 2017-10-25 DIAGNOSIS — Z79899 Other long term (current) drug therapy: Secondary | ICD-10-CM | POA: Diagnosis not present

## 2017-10-25 DIAGNOSIS — Z7982 Long term (current) use of aspirin: Secondary | ICD-10-CM

## 2017-10-25 DIAGNOSIS — I350 Nonrheumatic aortic (valve) stenosis: Secondary | ICD-10-CM | POA: Diagnosis not present

## 2017-10-25 DIAGNOSIS — Z87891 Personal history of nicotine dependence: Secondary | ICD-10-CM | POA: Diagnosis not present

## 2017-10-25 DIAGNOSIS — I509 Heart failure, unspecified: Secondary | ICD-10-CM | POA: Diagnosis not present

## 2017-10-25 DIAGNOSIS — I2510 Atherosclerotic heart disease of native coronary artery without angina pectoris: Secondary | ICD-10-CM | POA: Insufficient documentation

## 2017-10-25 DIAGNOSIS — D4620 Refractory anemia with excess of blasts, unspecified: Secondary | ICD-10-CM | POA: Insufficient documentation

## 2017-10-25 LAB — CBC WITH DIFFERENTIAL/PLATELET
BASOS PCT: 0 %
Basophils Absolute: 0 10*3/uL (ref 0–0.1)
Eosinophils Absolute: 0 10*3/uL (ref 0–0.7)
Eosinophils Relative: 1 %
HEMATOCRIT: 24.3 % — AB (ref 40.0–52.0)
Hemoglobin: 8.4 g/dL — ABNORMAL LOW (ref 13.0–18.0)
Lymphocytes Relative: 33 %
Lymphs Abs: 0.9 10*3/uL — ABNORMAL LOW (ref 1.0–3.6)
MCH: 37.6 pg — ABNORMAL HIGH (ref 26.0–34.0)
MCHC: 34.4 g/dL (ref 32.0–36.0)
MCV: 109.3 fL — ABNORMAL HIGH (ref 80.0–100.0)
MONO ABS: 0.7 10*3/uL (ref 0.2–1.0)
MONOS PCT: 27 %
NEUTROS ABS: 1.1 10*3/uL — AB (ref 1.4–6.5)
Neutrophils Relative %: 39 %
Platelets: 126 10*3/uL — ABNORMAL LOW (ref 150–440)
RBC: 2.22 MIL/uL — ABNORMAL LOW (ref 4.40–5.90)
RDW: 18.6 % — AB (ref 11.5–14.5)
WBC: 2.7 10*3/uL — ABNORMAL LOW (ref 3.8–10.6)

## 2017-10-25 MED ORDER — DARBEPOETIN ALFA 500 MCG/ML IJ SOSY
500.0000 ug | PREFILLED_SYRINGE | INTRAMUSCULAR | Status: DC
  Administered 2017-10-25: 500 ug via SUBCUTANEOUS
  Filled 2017-10-25: qty 1

## 2017-10-25 NOTE — Progress Notes (Signed)
Patient reports night sweats at night.  Pt reports chronic fatigue.

## 2017-10-25 NOTE — Assessment & Plan Note (Addendum)
#  MDS-predominant anemia; moderate leukopenia ANC 500; moderate thrombocytopenia at 100 platelet.  Continue aranesp every 3 weeks.  Patient's hemoglobin has been above 8; not using transfusions.   #However, intermittently worsening leukopenia-absolute neutrophil count elevated between 100-300; however today improved at 1.1.  Asymptomatic/no infections.  #I had a long discussion with the patient and his wife regarding-intermittent/declining white count-could be sign of progression of disease/transformation to acute leukemia.  Definitive diagnostic procedure would be bone marrow biopsy.  Patient reluctant.  Will check a peripheral blood flow cytometry.  # CAD- s/p recent NSTEMI- s/p PRBC transfusion approximately 2 months ago.  # Okay to hold off routine dental appointment  # cbc/aranesp in 3 weeks; and again in 6 weeks/ MD/labs/aranesp.   C; Dr.Zolotor.

## 2017-10-25 NOTE — Progress Notes (Signed)
[place] OFFICE PROGRESS NOTE  Patient Care Team: Zolotor, Audie Clear, MD as PCP - General  Cancer Staging No matching staging information was found for the patient.   Oncology History   DEC 2017- 2017- MDS-anemia prominent- anemia, responsive to darbepoietin, which he started in January 2018. darbepoietin with zyrtec  [Dr.Ma];    # CAD- NSTEMI-jan 2019/ AS [Dr.Gollan]        ------------------------------------------------ --  DEC 2017- Dr.Ma[Bone marrow, right iliac, aspiration and biopsy- -  Hypercellular bone marrow (80%) with trilineage hematopoiesis showing mild morphologic dyspoiesis and no increase in blasts (See Comment; 2% blasts) -  Routine cytogenetic results reveal a normal karyotype; see details below (case [id number]). -  Myeloid mutation panel results reveal variants detected in genes ZRSR2, TET2, and EZH2; see details below (]-  # Variants of potential clinical significance (ASCO/AMP/CAP Tier II): ZRSR2:  c.473delC [p.Pro180f]   TET2:  c.507dupT [p.Asn170Ter]  Variants of uncertain clinical significance (ASCO/AMP/CAP Tier III): TET2:  c.3928_3042del [p.Lys1310_Asp1314del] EZH2:  c.2022G>T [p.Leu674Phe]  No variants detected in ASXL1, BCOR, CEBPA, DNMT3A, ETV6/TEL, FLT3, IDH1, IDH2, KIT, NPM1, NRAS, RUNX1, SF3B1, SRSF2, STAG2, TP53, U2AF1, WT1          MDS (myelodysplastic syndrome), low grade (HCC)      INTERVAL HISTORY:  Terry Wahlen89y.[id number].  male pleasant patient above history of history of aortic stenosis CAD-history of MDS diagnosed December 2017-[facility]  Fortunately patient has not had any further hospitalizations since last visit.  He complains of mild to moderate fatigue.   He denies any infections.  Denies any fevers or chills.  He denies any worsening constipation.  Denies any worsening swelling of the legs.  REVIEW OF SYSTEMS:  A complete 10 point review of  system is done which is negative except mentioned above/history of present illness.   PAST MEDICAL HISTORY :  Past Medical History:  Diagnosis Date  . Aortic stenosis   . CAD (coronary artery disease)    a. NSTEMI 1/19 - LHC 1/19: ostial LAD 75%, proximal LAD 80%, ostial LCx 75%, mid LCX 75%, OM2 80%, ostial RCA 99%, mid RCA 95%, distal RCA 99%, med Rx  . [place] complication [place]   . Hypertension   . Ischemic cardiomyopathy    a. TTE 1/19: EF of 30-35%, hypokinesis of the anterior, anteroseptal, and apical myocardium, Gr1DD, moderate aortic stenosis (may be under estimated due to his cardiomyopathy), RV systolic function normal, left atrium normal size, unable to estimate PASP  . Myelodysplastic syndrome ([place]     PAST SURGICAL HISTORY :   Past Surgical History:  Procedure Laterality Date  . LEFT HEART CATH AND CORONARY ANGIOGRAPHY N/A 08/16/2017   Procedure: LEFT HEART CATH AND CORONARY ANGIOGRAPHY;  Surgeon: GMinna Merritts MD;  [facility];  Service: Cardiovascular;  Laterality: N/A;    FAMILY HISTORY :   Family History  Problem Relation Age of Onset  . Hypertension Father     SOCIAL HISTORY:   Social History   Tobacco Use  . Smoking status: Former Smoker    Packs/day: 1.00    Types: Cigarettes    Last attempt to quit: 08/13/1968    Years since quitting: 49.2  . Smokeless tobacco: Never Used  Substance Use Topics  . Alcohol use: No    Frequency: Never  . Drug use: No    ALLERGIES:  is allergic to penicillins.  MEDICATIONS:  Current Outpatient Medications  Medication Sig Dispense Refill  . albuterol (PROVENTIL HFA;VENTOLIN HFA) 108 (90 Base) MCG/ACT inhaler Inhale 2 puffs into the lungs every 6 (six) hours as needed for wheezing or shortness of breath. 1 Inhaler 2  . aspirin EC 81 MG EC tablet Take 1 tablet (81 mg total) by mouth daily. 30 tablet 0  . bisacodyl (DULCOLAX) 5 MG EC tablet Take 1 tablet (5 mg total) by mouth  daily as needed for moderate constipation. 30 tablet 0  . carvedilol (COREG) 3.125 MG tablet Take 1 tablet (3.125 mg total) by mouth 2 (two) times daily. 180 tablet 3  . clopidogrel (PLAVIX) 75 MG tablet Take 1 tablet (75 mg total) by mouth daily. 90 tablet 3  . Darbepoetin Alfa (ARANESP) 500 MCG/ML SOSY injection Inject 500 mcg into the skin every 30 (thirty) days.    Marland Kitchen docusate sodium (COLACE) 100 MG capsule Take 1 capsule (100 mg total) by mouth daily as needed. 30 capsule 2  . isosorbide mononitrate (IMDUR) 30 MG 24 hr tablet Take 1 tablet (30 mg total) by mouth daily. 30 tablet 3  . lisinopril (PRINIVIL,ZESTRIL) 10 MG tablet Take 1 tablet (10 mg total) by mouth daily. 30 tablet 3  . Multiple Vitamin (MULTI-VITAMINS) TABS Take 1 tablet by mouth daily.    Marland Kitchen senna-docusate (SENOKOT-S) 8.6-50 MG tablet Take 2 tablets by mouth daily as needed for mild constipation. 30 tablet 1  . simvastatin (ZOCOR) 10 MG tablet Take 10 mg by mouth daily.    Marland Kitchen triamcinolone cream (KENALOG) 0.1 % Apply 1 application topically 2 (two) times daily.    . nitroGLYCERIN (NITROSTAT) 0.4 MG SL tablet Place 1 tablet (0.4 mg total) under the tongue every 5 (five) minutes as needed for chest pain. (Patient not taking: Reported on 10/25/2017) 25 tablet 3   No current facility-administered medications for this visit.     PHYSICAL EXAMINATION: ECOG PERFORMANCE STATUS: 1 - Symptomatic but completely ambulatory  BP 124/65 (BP Location: Left Arm, Patient Position: Sitting)   Pulse (!) 58   Temp 98 F (36.7 C) (Tympanic)   Resp 20   Ht 6' (1.829 m)   Wt 161 lb (73 kg)   BMI 21.84 kg/m   Filed Weights   10/25/17 1044  Weight: 161 lb (73 kg)    GENERAL: Well-nourished well-developed; Alert, no distress and comfortable.   He is in a wheelchair.  Accompanied by his wife. EYES: no pallor or icterus OROPHARYNX: no thrush or ulceration; good dentition  NECK: supple, no masses felt LYMPH:  no palpable lymphadenopathy in  the cervical, axillary or inguinal regions LUNGS: clear to auscultation and  No wheeze or crackles HEART/CVS: regular rate & rhythm and no murmurs; No lower extremity edema ABDOMEN:abdomen soft, non-tender and normal bowel sounds Musculoskeletal:no cyanosis of digits and no clubbing  PSYCH: alert & oriented x 3 with fluent speech NEURO: no focal motor/sensory deficits SKIN:  no rashes or significant lesions  LABORATORY DATA:  I have reviewed the data as listed    Component Value Date/Time   NA 136 09/13/2017 1010   NA 140 05/27/2014 1418   K 3.9 09/13/2017 1010   K 3.8 05/27/2014 1418   CL 103 09/13/2017 1010   CL 106 05/27/2014 1418   CO2 26 09/13/2017 1010   CO2 28 05/27/2014 1418   GLUCOSE 121 (H) 09/13/2017 1010   GLUCOSE 166 (H) 05/27/2014 1418   BUN 21 (H) 09/13/2017 1010   BUN 18 05/27/2014 1418  CREATININE 0.98 09/13/2017 1010   CREATININE 1.24 05/27/2014 1418   CALCIUM 9.1 09/13/2017 1010   CALCIUM 9.1 05/27/2014 1418   PROT 6.8 09/13/2017 1010   PROT 7.2 05/27/2014 1418   ALBUMIN 3.5 09/13/2017 1010   ALBUMIN 3.5 05/27/2014 1418   AST 21 09/13/2017 1010   AST 25 05/27/2014 1418   ALT 11 (L) 09/13/2017 1010   ALT 23 05/27/2014 1418   ALKPHOS 82 09/13/2017 1010   ALKPHOS 113 05/27/2014 1418   BILITOT 0.6 09/13/2017 1010   BILITOT 0.7 05/27/2014 1418   GFRNONAA >60 09/13/2017 1010   GFRNONAA 59 (L) 05/27/2014 1418   GFRAA >60 09/13/2017 1010   GFRAA >60 05/27/2014 1418    No results found for: SPEP, UPEP  Lab Results  Component Value Date   WBC 2.7 (L) 10/25/2017   NEUTROABS 1.1 (L) 10/25/2017   HGB 8.4 (L) 10/25/2017   HCT 24.3 (L) 10/25/2017   MCV 109.3 (H) 10/25/2017   PLT 126 (L) 10/25/2017      Chemistry      Component Value Date/Time   NA 136 09/13/2017 1010   NA 140 05/27/2014 1418   K 3.9 09/13/2017 1010   K 3.8 05/27/2014 1418   CL 103 09/13/2017 1010   CL 106 05/27/2014 1418   CO2 26 09/13/2017 1010   CO2 28 05/27/2014 1418   BUN  21 (H) 09/13/2017 1010   BUN 18 05/27/2014 1418   CREATININE 0.98 09/13/2017 1010   CREATININE 1.24 05/27/2014 1418      Component Value Date/Time   CALCIUM 9.1 09/13/2017 1010   CALCIUM 9.1 05/27/2014 1418   ALKPHOS 82 09/13/2017 1010   ALKPHOS 113 05/27/2014 1418   AST 21 09/13/2017 1010   AST 25 05/27/2014 1418   ALT 11 (L) 09/13/2017 1010   ALT 23 05/27/2014 1418   BILITOT 0.6 09/13/2017 1010   BILITOT 0.7 05/27/2014 1418       RADIOGRAPHIC STUDIES: I have personally reviewed the radiological images as listed and agreed with the findings in the report. No results found.   ASSESSMENT & PLAN:  MDS (myelodysplastic syndrome), low grade (HCC) # MDS-predominant anemia; moderate leukopenia ANC 500; moderate thrombocytopenia at 100 platelet.  Continue aranesp every 3 weeks.  Patient's hemoglobin has been above 8; not using transfusions.   #However, intermittently worsening leukopenia-absolute neutrophil count elevated between 100-300; however today improved at 1.1.  Asymptomatic/no infections.  #I had a long discussion with the patient and his wife regarding-intermittent/declining white count-could be sign of progression of disease/transformation to acute leukemia.  Definitive diagnostic procedure would be bone marrow biopsy.  Patient reluctant.  Will check a peripheral blood flow cytometry.  # CAD- s/p recent NSTEMI- s/p PRBC transfusion approximately 2 weeks ago.  # Okay to hold off routine dental appointment  # cbc/aranesp in 3 weeks; and again in 6 weeks/ MD/labs/aranesp.   C; Dr.Zolotor.    Orders Placed This Encounter  Procedures  . Flow cytometry panel-leukemia/lymphoma work-up    Standing Status:   Future    Standing Expiration Date:   10/26/2018   All questions were answered. The patient knows to call the clinic with any problems, questions or concerns.      Cammie Sickle, MD 10/26/2017 11:42 PM

## 2017-11-15 ENCOUNTER — Inpatient Hospital Stay: Payer: Medicare Other

## 2017-11-15 ENCOUNTER — Inpatient Hospital Stay: Payer: Medicare Other | Attending: Internal Medicine

## 2017-11-15 VITALS — BP 110/65 | HR 56

## 2017-11-15 DIAGNOSIS — Z7982 Long term (current) use of aspirin: Secondary | ICD-10-CM | POA: Diagnosis not present

## 2017-11-15 DIAGNOSIS — R05 Cough: Secondary | ICD-10-CM | POA: Diagnosis not present

## 2017-11-15 DIAGNOSIS — D4620 Refractory anemia with excess of blasts, unspecified: Secondary | ICD-10-CM

## 2017-11-15 DIAGNOSIS — Z8701 Personal history of pneumonia (recurrent): Secondary | ICD-10-CM | POA: Diagnosis not present

## 2017-11-15 DIAGNOSIS — I255 Ischemic cardiomyopathy: Secondary | ICD-10-CM | POA: Insufficient documentation

## 2017-11-15 DIAGNOSIS — E119 Type 2 diabetes mellitus without complications: Secondary | ICD-10-CM | POA: Diagnosis not present

## 2017-11-15 DIAGNOSIS — I10 Essential (primary) hypertension: Secondary | ICD-10-CM | POA: Diagnosis not present

## 2017-11-15 DIAGNOSIS — Z79899 Other long term (current) drug therapy: Secondary | ICD-10-CM | POA: Insufficient documentation

## 2017-11-15 DIAGNOSIS — I350 Nonrheumatic aortic (valve) stenosis: Secondary | ICD-10-CM | POA: Insufficient documentation

## 2017-11-15 DIAGNOSIS — D469 Myelodysplastic syndrome, unspecified: Secondary | ICD-10-CM | POA: Diagnosis present

## 2017-11-15 DIAGNOSIS — I252 Old myocardial infarction: Secondary | ICD-10-CM | POA: Insufficient documentation

## 2017-11-15 DIAGNOSIS — Z87891 Personal history of nicotine dependence: Secondary | ICD-10-CM | POA: Insufficient documentation

## 2017-11-15 DIAGNOSIS — I2510 Atherosclerotic heart disease of native coronary artery without angina pectoris: Secondary | ICD-10-CM | POA: Diagnosis not present

## 2017-11-15 DIAGNOSIS — D72819 Decreased white blood cell count, unspecified: Secondary | ICD-10-CM | POA: Diagnosis not present

## 2017-11-15 LAB — CBC WITH DIFFERENTIAL/PLATELET
BASOS ABS: 0 10*3/uL (ref 0–0.1)
BASOS PCT: 1 %
Eosinophils Absolute: 0 10*3/uL (ref 0–0.7)
Eosinophils Relative: 1 %
HEMATOCRIT: 28 % — AB (ref 40.0–52.0)
HEMOGLOBIN: 9.6 g/dL — AB (ref 13.0–18.0)
Lymphocytes Relative: 40 %
Lymphs Abs: 0.8 10*3/uL — ABNORMAL LOW (ref 1.0–3.6)
MCH: 37.2 pg — ABNORMAL HIGH (ref 26.0–34.0)
MCHC: 34.3 g/dL (ref 32.0–36.0)
MCV: 108.7 fL — ABNORMAL HIGH (ref 80.0–100.0)
MONOS PCT: 27 %
Monocytes Absolute: 0.5 10*3/uL (ref 0.2–1.0)
NEUTROS ABS: 0.6 10*3/uL — AB (ref 1.4–6.5)
NEUTROS PCT: 31 %
Platelets: 97 10*3/uL — ABNORMAL LOW (ref 150–440)
RBC: 2.58 MIL/uL — ABNORMAL LOW (ref 4.40–5.90)
RDW: 17 % — ABNORMAL HIGH (ref 11.5–14.5)
WBC: 1.9 10*3/uL — ABNORMAL LOW (ref 3.8–10.6)

## 2017-11-15 MED ORDER — DARBEPOETIN ALFA 500 MCG/ML IJ SOSY
500.0000 ug | PREFILLED_SYRINGE | INTRAMUSCULAR | Status: DC
  Administered 2017-11-15: 500 ug via SUBCUTANEOUS
  Filled 2017-11-15: qty 1

## 2017-11-18 LAB — COMP PANEL: LEUKEMIA/LYMPHOMA

## 2017-11-27 ENCOUNTER — Ambulatory Visit: Payer: Medicare Other | Admitting: Cardiovascular Disease

## 2017-12-06 ENCOUNTER — Other Ambulatory Visit: Payer: Self-pay

## 2017-12-06 ENCOUNTER — Inpatient Hospital Stay: Payer: Medicare Other

## 2017-12-06 ENCOUNTER — Inpatient Hospital Stay: Payer: Medicare Other | Admitting: Internal Medicine

## 2017-12-06 ENCOUNTER — Encounter: Payer: Self-pay | Admitting: Internal Medicine

## 2017-12-06 VITALS — BP 122/66 | HR 71 | Temp 97.2°F | Resp 20 | Ht 72.0 in | Wt 160.0 lb

## 2017-12-06 DIAGNOSIS — I10 Essential (primary) hypertension: Secondary | ICD-10-CM

## 2017-12-06 DIAGNOSIS — I255 Ischemic cardiomyopathy: Secondary | ICD-10-CM

## 2017-12-06 DIAGNOSIS — R05 Cough: Secondary | ICD-10-CM

## 2017-12-06 DIAGNOSIS — I350 Nonrheumatic aortic (valve) stenosis: Secondary | ICD-10-CM | POA: Diagnosis not present

## 2017-12-06 DIAGNOSIS — Z87891 Personal history of nicotine dependence: Secondary | ICD-10-CM | POA: Diagnosis not present

## 2017-12-06 DIAGNOSIS — D469 Myelodysplastic syndrome, unspecified: Secondary | ICD-10-CM | POA: Diagnosis not present

## 2017-12-06 DIAGNOSIS — D4620 Refractory anemia with excess of blasts, unspecified: Secondary | ICD-10-CM

## 2017-12-06 DIAGNOSIS — Z79899 Other long term (current) drug therapy: Secondary | ICD-10-CM

## 2017-12-06 DIAGNOSIS — D72819 Decreased white blood cell count, unspecified: Secondary | ICD-10-CM

## 2017-12-06 DIAGNOSIS — I252 Old myocardial infarction: Secondary | ICD-10-CM

## 2017-12-06 DIAGNOSIS — I2510 Atherosclerotic heart disease of native coronary artery without angina pectoris: Secondary | ICD-10-CM

## 2017-12-06 DIAGNOSIS — E119 Type 2 diabetes mellitus without complications: Secondary | ICD-10-CM

## 2017-12-06 DIAGNOSIS — Z7982 Long term (current) use of aspirin: Secondary | ICD-10-CM | POA: Diagnosis not present

## 2017-12-06 DIAGNOSIS — Z8701 Personal history of pneumonia (recurrent): Secondary | ICD-10-CM

## 2017-12-06 LAB — CBC WITH DIFFERENTIAL/PLATELET
BASOS PCT: 1 %
Basophils Absolute: 0 10*3/uL (ref 0–0.1)
EOS ABS: 0 10*3/uL (ref 0–0.7)
Eosinophils Relative: 1 %
HCT: 28.2 % — ABNORMAL LOW (ref 40.0–52.0)
HEMOGLOBIN: 9.8 g/dL — AB (ref 13.0–18.0)
LYMPHS ABS: 0.9 10*3/uL — AB (ref 1.0–3.6)
Lymphocytes Relative: 29 %
MCH: 37.9 pg — AB (ref 26.0–34.0)
MCHC: 34.7 g/dL (ref 32.0–36.0)
MCV: 109.2 fL — ABNORMAL HIGH (ref 80.0–100.0)
Monocytes Absolute: 0.5 10*3/uL (ref 0.2–1.0)
Monocytes Relative: 17 %
NEUTROS ABS: 1.6 10*3/uL (ref 1.4–6.5)
NEUTROS PCT: 52 %
Platelets: 139 10*3/uL — ABNORMAL LOW (ref 150–440)
RBC: 2.58 MIL/uL — AB (ref 4.40–5.90)
RDW: 15.7 % — ABNORMAL HIGH (ref 11.5–14.5)
WBC: 3 10*3/uL — AB (ref 3.8–10.6)

## 2017-12-06 MED ORDER — DARBEPOETIN ALFA 500 MCG/ML IJ SOSY
500.0000 ug | PREFILLED_SYRINGE | INTRAMUSCULAR | Status: DC
  Administered 2017-12-06: 500 ug via SUBCUTANEOUS
  Filled 2017-12-06: qty 1

## 2017-12-06 NOTE — Assessment & Plan Note (Addendum)
#  MDS-predominant anemia; intermittent leucopenia/ moderate thrombocytopenia at 100 platelet.  Continue aranesp every 3 weeks.  Patient's hemoglobin has been above 8; not using transfusions.  Today hemoglobin is 9.8.  #However, intermittently worsening leukopenia-absolute neutrophil count elevated is 1.3; recent peripheral blood flow cytometry negative for any blasts.  Discussed that the only way to know to confirm would be is a bone marrow biopsy which they are reluctant.   # CAD- s/p recent NSTEMI- s/p PRBC transfusion approximately 3-4 months ago.  #Recent pneumonia/status post Levaquin; currently improved.   # cbc/aranesp in 3 weeks; and again in 6 weeks/ MD/labs/aranesp.   Cc; Dr.Warshaw/ Dr.Zolotor.

## 2017-12-06 NOTE — Progress Notes (Signed)
Patient recovering from pneumonia. Currently being tx with levaquin 500 mg daily x 7 days. Started this script on 4/24. He reports improvement in symptoms after starting the levaquin. He states that he was previously tx with a zpac for URI symptoms.

## 2017-12-06 NOTE — Progress Notes (Signed)
[place] OFFICE PROGRESS NOTE  Patient Care Team: Zolotor, Audie Clear, MD as PCP - General  Cancer Staging No matching staging information was found for the patient.   Oncology History   DEC 2017- 2017- MDS-anemia prominent- anemia, responsive to darbepoietin, which he started in January 2018. darbepoietin with zyrtec  [Dr.Ma];    # CAD- NSTEMI-jan 2019/ AS [Dr.Gollan]        ------------------------------------------------ --  DEC 2017- Dr.Ma[Bone marrow, right iliac, aspiration and biopsy- -  Hypercellular bone marrow (80%) with trilineage hematopoiesis showing mild morphologic dyspoiesis and no increase in blasts (See Comment; 2% blasts) -  Routine cytogenetic results reveal a normal karyotype; see details below (case [id number]). -  Myeloid mutation panel results reveal variants detected in genes ZRSR2, TET2, and EZH2; see details below (]-  # Variants of potential clinical significance (ASCO/AMP/CAP Tier II): ZRSR2:  c.473delC [p.Pro121f]   TET2:  c.507dupT [p.Asn170Ter]  Variants of uncertain clinical significance (ASCO/AMP/CAP Tier III): TET2:  c.3928_3042del [p.Lys1310_Asp1314del] EZH2:  c.2022G>T [p.Leu674Phe]  No variants detected in ASXL1, BCOR, CEBPA, DNMT3A, ETV6/TEL, FLT3, IDH1, IDH2, KIT, NPM1, NRAS, RUNX1, SF3B1, SRSF2, STAG2, TP53, U2AF1, WT1          MDS (myelodysplastic syndrome), low grade (HCC)      INTERVAL HISTORY:  GVin [id number].o.  male pleasant patient above history of history of aortic stenosis CAD-history of MDS diagnosed December 2017-[facility]  In the interim  Patient was evaluated with PCP-for possible pneumonia.  He did not improve with Z-Pak; then seen in the office started on Levaquin.  His cough improved.  Energy levels improved. denies any fevers or chills.  He denies any worsening constipation.  Denies any worsening swelling of the legs.  Complains  of mild fatigue.  Otherwise not significantly worse.  REVIEW OF SYSTEMS:  A complete 10 point review of system is done which is negative except mentioned above/history of present illness.   PAST MEDICAL HISTORY :  Past Medical History:  Diagnosis Date  . Aortic stenosis   . CAD (coronary artery disease)    a. NSTEMI 1/19 - LHC 1/19: ostial LAD 75%, proximal LAD 80%, ostial LCx 75%, mid LCX 75%, OM2 80%, ostial RCA 99%, mid RCA 95%, distal RCA 99%, med Rx  . Diabetes mellitus without complication ([facility]   . Hypertension   . Ischemic cardiomyopathy    a. TTE 1/19: EF of 30-35%, hypokinesis of the anterior, anteroseptal, and apical myocardium, Gr1DD, moderate aortic stenosis (may be under estimated due to his cardiomyopathy), RV systolic function normal, left atrium normal size, unable to estimate PASP  . Myelodysplastic syndrome ([place]     PAST SURGICAL HISTORY :   Past Surgical History:  Procedure Laterality Date  . LEFT HEART CATH AND CORONARY ANGIOGRAPHY N/A 08/16/2017   Procedure: LEFT HEART CATH AND CORONARY ANGIOGRAPHY;  Surgeon: GMinna Merritts MD;  [facility];  Service: Cardiovascular;  Laterality: N/A;    FAMILY HISTORY :   Family History  Problem Relation Age of Onset  . Hypertension Father     SOCIAL HISTORY:   Social History   Tobacco Use  . Smoking status: Former Smoker    Packs/day: 1.00    Types: Cigarettes    Last attempt to quit: 08/13/1968    Years since quitting: 49.3  . Smokeless tobacco: Never Used  Substance Use Topics  . Alcohol use: No  Frequency: Never  . Drug use: No    ALLERGIES:  is allergic to penicillins.  MEDICATIONS:  Current Outpatient Medications  Medication Sig Dispense Refill  . albuterol (PROVENTIL HFA;VENTOLIN HFA) 108 (90 Base) MCG/ACT inhaler Inhale 2 puffs into the lungs every 6 (six) hours as needed for wheezing or shortness of breath. 1 Inhaler 2  . aspirin EC 81 MG EC tablet Take 1 tablet (81 mg total) by  mouth daily. 30 tablet 0  . bisacodyl (DULCOLAX) 5 MG EC tablet Take 1 tablet (5 mg total) by mouth daily as needed for moderate constipation. 30 tablet 0  . carvedilol (COREG) 3.125 MG tablet Take 1 tablet (3.125 mg total) by mouth 2 (two) times daily. 180 tablet 3  . clopidogrel (PLAVIX) 75 MG tablet Take 1 tablet (75 mg total) by mouth daily. 90 tablet 3  . Darbepoetin Alfa (ARANESP) 500 MCG/ML SOSY injection Inject 500 mcg into the skin every 30 (thirty) days.    Marland Kitchen docusate sodium (COLACE) 100 MG capsule Take 1 capsule (100 mg total) by mouth daily as needed. 30 capsule 2  . fluticasone (FLONASE) 50 MCG/ACT nasal spray Place 2 sprays into the nose daily.    . isosorbide mononitrate (IMDUR) 30 MG 24 hr tablet Take 1 tablet (30 mg total) by mouth daily. 30 tablet 3  . levofloxacin (LEVAQUIN) 500 MG tablet Take 1 tablet by mouth daily.    Marland Kitchen lisinopril (PRINIVIL,ZESTRIL) 10 MG tablet Take 1 tablet (10 mg total) by mouth daily. 30 tablet 3  . metoprolol succinate (TOPROL-XL) 25 MG 24 hr tablet Take 12.5 mg by mouth daily.    . Multiple Vitamin (MULTI-VITAMINS) TABS Take 1 tablet by mouth daily.    Marland Kitchen senna-docusate (SENOKOT-S) 8.6-50 MG tablet Take 2 tablets by mouth daily as needed for mild constipation. 30 tablet 1  . simvastatin (ZOCOR) 10 MG tablet Take 10 mg by mouth daily.    Marland Kitchen triamcinolone cream (KENALOG) 0.1 % Apply 1 application topically 2 (two) times daily.    . nitroGLYCERIN (NITROSTAT) 0.4 MG SL tablet Place 1 tablet (0.4 mg total) under the tongue every 5 (five) minutes as needed for chest pain. (Patient not taking: Reported on 10/25/2017) 25 tablet 3   No current facility-administered medications for this visit.    Facility-Administered Medications Ordered in Other Visits  Medication Dose Route Frequency Provider Last Rate Last Dose  . Darbepoetin Alfa (ARANESP) injection 500 mcg  500 mcg Subcutaneous Weekly Charlaine Dalton R, MD   500 mcg at 12/06/17 1127    PHYSICAL  EXAMINATION: ECOG PERFORMANCE STATUS: 1 - Symptomatic but completely ambulatory  BP 122/66 (BP Location: Left Arm, Patient Position: Sitting)   Pulse 71   Temp (!) 97.2 F (36.2 C) (Tympanic)   Resp 20   Ht 6' (1.829 m)   Wt 160 lb (72.6 kg)   BMI 21.70 kg/m   Filed Weights   12/06/17 1033  Weight: 160 lb (72.6 kg)    GENERAL: Well-nourished well-developed; Alert, no distress and comfortable.   He is in a wheelchair.  Accompanied by his wife. EYES: no pallor or icterus OROPHARYNX: no thrush or ulceration; good dentition  NECK: supple, no masses felt LYMPH:  no palpable lymphadenopathy in the cervical, axillary or inguinal regions LUNGS: clear to auscultation and  No wheeze or crackles HEART/CVS: regular rate & rhythm and no murmurs; No lower extremity edema ABDOMEN:abdomen soft, non-tender and normal bowel sounds Musculoskeletal:no cyanosis of digits and no clubbing  PSYCH: alert &  oriented x 3 with fluent speech NEURO: no focal motor/sensory deficits SKIN:  no rashes or significant lesions  LABORATORY DATA:  I have reviewed the data as listed    Component Value Date/Time   NA 136 09/13/2017 1010   NA 140 05/27/2014 1418   K 3.9 09/13/2017 1010   K 3.8 05/27/2014 1418   CL 103 09/13/2017 1010   CL 106 05/27/2014 1418   CO2 26 09/13/2017 1010   CO2 28 05/27/2014 1418   GLUCOSE 121 (H) 09/13/2017 1010   GLUCOSE 166 (H) 05/27/2014 1418   BUN 21 (H) 09/13/2017 1010   BUN 18 05/27/2014 1418   CREATININE 0.98 09/13/2017 1010   CREATININE 1.24 05/27/2014 1418   CALCIUM 9.1 09/13/2017 1010   CALCIUM 9.1 05/27/2014 1418   PROT 6.8 09/13/2017 1010   PROT 7.2 05/27/2014 1418   ALBUMIN 3.5 09/13/2017 1010   ALBUMIN 3.5 05/27/2014 1418   AST 21 09/13/2017 1010   AST 25 05/27/2014 1418   ALT 11 (L) 09/13/2017 1010   ALT 23 05/27/2014 1418   ALKPHOS 82 09/13/2017 1010   ALKPHOS 113 05/27/2014 1418   BILITOT 0.6 09/13/2017 1010   BILITOT 0.7 05/27/2014 1418   GFRNONAA  >60 09/13/2017 1010   GFRNONAA 59 (L) 05/27/2014 1418   GFRAA >60 09/13/2017 1010   GFRAA >60 05/27/2014 1418    No results found for: SPEP, UPEP  Lab Results  Component Value Date   WBC 3.0 (L) 12/06/2017   NEUTROABS 1.6 12/06/2017   HGB 9.8 (L) 12/06/2017   HCT 28.2 (L) 12/06/2017   MCV 109.2 (H) 12/06/2017   PLT 139 (L) 12/06/2017      Chemistry      Component Value Date/Time   NA 136 09/13/2017 1010   NA 140 05/27/2014 1418   K 3.9 09/13/2017 1010   K 3.8 05/27/2014 1418   CL 103 09/13/2017 1010   CL 106 05/27/2014 1418   CO2 26 09/13/2017 1010   CO2 28 05/27/2014 1418   BUN 21 (H) 09/13/2017 1010   BUN 18 05/27/2014 1418   CREATININE 0.98 09/13/2017 1010   CREATININE 1.24 05/27/2014 1418      Component Value Date/Time   CALCIUM 9.1 09/13/2017 1010   CALCIUM 9.1 05/27/2014 1418   ALKPHOS 82 09/13/2017 1010   ALKPHOS 113 05/27/2014 1418   AST 21 09/13/2017 1010   AST 25 05/27/2014 1418   ALT 11 (L) 09/13/2017 1010   ALT 23 05/27/2014 1418   BILITOT 0.6 09/13/2017 1010   BILITOT 0.7 05/27/2014 1418       RADIOGRAPHIC STUDIES: I have personally reviewed the radiological images as listed and agreed with the findings in the report. No results found.   ASSESSMENT & PLAN:  MDS (myelodysplastic syndrome), low grade (HCC) # MDS-predominant anemia; intermittent leucopenia/ moderate thrombocytopenia at 100 platelet.  Continue aranesp every 3 weeks.  Patient's hemoglobin has been above 8; not using transfusions.  Today hemoglobin is 9.8.  #However, intermittently worsening leukopenia-absolute neutrophil count elevated is 1.3; recent peripheral blood flow cytometry negative for any blasts.  Discussed that the only way to know to confirm would be is a bone marrow biopsy which they are reluctant.   # CAD- s/p recent NSTEMI- s/p PRBC transfusion approximately 3-4 months ago.  #Recent pneumonia/status post Levaquin; currently improved.   # cbc/aranesp in 3 weeks; and  again in 6 weeks/ MD/labs/aranesp.   Cc; Dr.Warshaw/ Dr.Zolotor.    Orders Placed This Encounter  Procedures  .  CBC with Differential/Platelet    Standing Status:   Standing    Number of Occurrences:   10    Standing Expiration Date:   12/07/2018  . Comprehensive metabolic panel    Standing Status:   Standing    Number of Occurrences:   10    Standing Expiration Date:   12/07/2018   All questions were answered. The patient knows to call the clinic with any problems, questions or concerns.      Cammie Sickle, MD 12/06/2017 2:39 PM

## 2017-12-27 ENCOUNTER — Inpatient Hospital Stay: Payer: Medicare Other | Attending: Internal Medicine

## 2017-12-27 ENCOUNTER — Inpatient Hospital Stay: Payer: Medicare Other

## 2017-12-27 VITALS — BP 91/56 | HR 53

## 2017-12-27 DIAGNOSIS — Z79899 Other long term (current) drug therapy: Secondary | ICD-10-CM | POA: Diagnosis not present

## 2017-12-27 DIAGNOSIS — D4620 Refractory anemia with excess of blasts, unspecified: Secondary | ICD-10-CM

## 2017-12-27 LAB — COMPREHENSIVE METABOLIC PANEL
ALT: 10 U/L — ABNORMAL LOW (ref 17–63)
ANION GAP: 8 (ref 5–15)
AST: 19 U/L (ref 15–41)
Albumin: 3.6 g/dL (ref 3.5–5.0)
Alkaline Phosphatase: 82 U/L (ref 38–126)
BILIRUBIN TOTAL: 0.9 mg/dL (ref 0.3–1.2)
BUN: 24 mg/dL — ABNORMAL HIGH (ref 6–20)
CO2: 23 mmol/L (ref 22–32)
Calcium: 9.5 mg/dL (ref 8.9–10.3)
Chloride: 108 mmol/L (ref 101–111)
Creatinine, Ser: 0.85 mg/dL (ref 0.61–1.24)
GLUCOSE: 100 mg/dL — AB (ref 65–99)
POTASSIUM: 4.2 mmol/L (ref 3.5–5.1)
Sodium: 139 mmol/L (ref 135–145)
TOTAL PROTEIN: 6.9 g/dL (ref 6.5–8.1)

## 2017-12-27 LAB — CBC WITH DIFFERENTIAL/PLATELET
Basophils Absolute: 0 10*3/uL (ref 0–0.1)
Basophils Relative: 1 %
Eosinophils Absolute: 0 10*3/uL (ref 0–0.7)
Eosinophils Relative: 1 %
HEMATOCRIT: 28.7 % — AB (ref 40.0–52.0)
HEMOGLOBIN: 9.7 g/dL — AB (ref 13.0–18.0)
LYMPHS ABS: 0.8 10*3/uL — AB (ref 1.0–3.6)
Lymphocytes Relative: 45 %
MCH: 37 pg — AB (ref 26.0–34.0)
MCHC: 33.9 g/dL (ref 32.0–36.0)
MCV: 109.3 fL — AB (ref 80.0–100.0)
MONO ABS: 0.5 10*3/uL (ref 0.2–1.0)
MONOS PCT: 28 %
NEUTROS ABS: 0.5 10*3/uL — AB (ref 1.4–6.5)
Neutrophils Relative %: 25 %
Platelets: 82 10*3/uL — ABNORMAL LOW (ref 150–440)
RBC: 2.63 MIL/uL — ABNORMAL LOW (ref 4.40–5.90)
RDW: 15.8 % — AB (ref 11.5–14.5)
WBC: 1.9 10*3/uL — ABNORMAL LOW (ref 3.8–10.6)

## 2017-12-27 MED ORDER — DARBEPOETIN ALFA 500 MCG/ML IJ SOSY
500.0000 ug | PREFILLED_SYRINGE | INTRAMUSCULAR | Status: AC
  Administered 2017-12-27: 500 ug via SUBCUTANEOUS
  Filled 2017-12-27: qty 1

## 2018-01-17 ENCOUNTER — Inpatient Hospital Stay: Payer: Medicare Other | Attending: Nurse Practitioner

## 2018-01-17 ENCOUNTER — Inpatient Hospital Stay: Payer: Medicare Other

## 2018-01-17 ENCOUNTER — Inpatient Hospital Stay: Payer: Medicare Other | Admitting: Nurse Practitioner

## 2018-01-17 ENCOUNTER — Encounter: Payer: Self-pay | Admitting: Nurse Practitioner

## 2018-01-17 VITALS — BP 129/66 | HR 52 | Temp 97.2°F | Resp 20 | Wt 159.7 lb

## 2018-01-17 DIAGNOSIS — E119 Type 2 diabetes mellitus without complications: Secondary | ICD-10-CM

## 2018-01-17 DIAGNOSIS — Z87891 Personal history of nicotine dependence: Secondary | ICD-10-CM | POA: Diagnosis not present

## 2018-01-17 DIAGNOSIS — I10 Essential (primary) hypertension: Secondary | ICD-10-CM

## 2018-01-17 DIAGNOSIS — D72819 Decreased white blood cell count, unspecified: Secondary | ICD-10-CM

## 2018-01-17 DIAGNOSIS — D4620 Refractory anemia with excess of blasts, unspecified: Secondary | ICD-10-CM | POA: Diagnosis not present

## 2018-01-17 DIAGNOSIS — I255 Ischemic cardiomyopathy: Secondary | ICD-10-CM | POA: Insufficient documentation

## 2018-01-17 DIAGNOSIS — I252 Old myocardial infarction: Secondary | ICD-10-CM

## 2018-01-17 DIAGNOSIS — D696 Thrombocytopenia, unspecified: Secondary | ICD-10-CM | POA: Insufficient documentation

## 2018-01-17 DIAGNOSIS — Z79899 Other long term (current) drug therapy: Secondary | ICD-10-CM | POA: Insufficient documentation

## 2018-01-17 DIAGNOSIS — I350 Nonrheumatic aortic (valve) stenosis: Secondary | ICD-10-CM | POA: Insufficient documentation

## 2018-01-17 DIAGNOSIS — I2510 Atherosclerotic heart disease of native coronary artery without angina pectoris: Secondary | ICD-10-CM | POA: Diagnosis not present

## 2018-01-17 LAB — COMPREHENSIVE METABOLIC PANEL
ALBUMIN: 3.6 g/dL (ref 3.5–5.0)
ALK PHOS: 80 U/L (ref 38–126)
ALT: 10 U/L — ABNORMAL LOW (ref 17–63)
ANION GAP: 7 (ref 5–15)
AST: 19 U/L (ref 15–41)
BUN: 21 mg/dL — ABNORMAL HIGH (ref 6–20)
CALCIUM: 9.3 mg/dL (ref 8.9–10.3)
CO2: 24 mmol/L (ref 22–32)
Chloride: 107 mmol/L (ref 101–111)
Creatinine, Ser: 0.96 mg/dL (ref 0.61–1.24)
GFR calc Af Amer: >60 mL/min (ref >60)
GFR calc non Af Amer: >60 mL/min (ref >60)
GLUCOSE: 155 mg/dL — AB (ref 65–99)
POTASSIUM: 4.5 mmol/L (ref 3.5–5.1)
Sodium: 138 mmol/L (ref 135–145)
Total Bilirubin: 0.5 mg/dL (ref 0.3–1.2)
Total Protein: 6.7 g/dL (ref 6.5–8.1)

## 2018-01-17 LAB — CBC WITH DIFFERENTIAL/PLATELET
Basophils Absolute: 0 10*3/uL (ref 0–0.1)
Basophils Relative: 1 %
Eosinophils Absolute: 0 10*3/uL (ref 0–0.7)
Eosinophils Relative: 1 %
HEMATOCRIT: 28.3 % — AB (ref 40.0–52.0)
Hemoglobin: 9.6 g/dL — ABNORMAL LOW (ref 13.0–18.0)
LYMPHS PCT: 41 %
Lymphs Abs: 0.9 10*3/uL — ABNORMAL LOW (ref 1.0–3.6)
MCH: 37.1 pg — ABNORMAL HIGH (ref 26.0–34.0)
MCHC: 34 g/dL (ref 32.0–36.0)
MCV: 109.2 fL — AB (ref 80.0–100.0)
MONO ABS: 0.5 10*3/uL (ref 0.2–1.0)
MONOS PCT: 26 %
NEUTROS ABS: 0.6 10*3/uL — AB (ref 1.4–6.5)
Neutrophils Relative %: 31 %
Platelets: 82 10*3/uL — ABNORMAL LOW (ref 150–440)
RBC: 2.59 MIL/uL — ABNORMAL LOW (ref 4.40–5.90)
RDW: 15.9 % — AB (ref 11.5–14.5)
WBC: 2.1 10*3/uL — ABNORMAL LOW (ref 3.8–10.6)

## 2018-01-17 MED ORDER — DARBEPOETIN ALFA 500 MCG/ML IJ SOSY
500.0000 ug | PREFILLED_SYRINGE | INTRAMUSCULAR | Status: DC
  Administered 2018-01-17: 500 ug via SUBCUTANEOUS
  Filled 2018-01-17: qty 1

## 2018-01-17 NOTE — Progress Notes (Signed)
[facility] OFFICE PROGRESS NOTE  Patient Care Team: Zolotor, Audie Clear, MD as PCP - General Rogue Bussing Elisha Headland, MD as Medical Oncologist (Medical Oncology)  Cancer Staging No matching staging information was found for the patient.   Oncology History   DEC 2017- 2017- MDS-anemia prominent- anemia, responsive to darbepoietin, which he started in January 2018. darbepoietin with zyrtec  [Dr.Ma];    # CAD- NSTEMI-jan 2019/ AS [Dr.Gollan]        ------------------------------------------------ --  DEC 2017- Dr.Ma[Bone marrow, right iliac, aspiration and biopsy- -  Hypercellular bone marrow (80%) with trilineage hematopoiesis showing mild morphologic dyspoiesis and no increase in blasts (See Comment; 2% blasts) -  Routine cytogenetic results reveal a normal karyotype; see details below (case [id number]). -  Myeloid mutation panel results reveal variants detected in genes ZRSR2, TET2, and EZH2; see details below (]-  # Variants of potential clinical significance (ASCO/AMP/CAP Tier II): ZRSR2:  c.473delC [p.Pro169f]   TET2:  c.507dupT [p.Asn170Ter]  Variants of uncertain clinical significance (ASCO/AMP/CAP Tier III): TET2:  c.3928_3042del [p.Lys1310_Asp1314del] EZH2:  c.2022G>T [p.Leu674Phe]  No variants detected in ASXL1, BCOR, CEBPA, DNMT3A, ETV6/TEL, FLT3, IDH1, IDH2, KIT, NPM1, NRAS, RUNX1, SF3B1, SRSF2, STAG2, TP53, U2AF1, WT1          MDS (myelodysplastic syndrome), low grade (HCC)      INTERVAL HISTORY:  Terry [id number].[id number].  male pleasant patient above history of history of aortic stenosis CAD, history of MDS (diagnosed 07/2016 and [facility] for follow-up and consideration of Aranesp.    Today he reports feeling well and states his energy has continued to improve. He has recovered from pneumonia and completed levaquin. He continues to have mild fatigue but otherwise feels well. Constipation is  stable.    REVIEW OF SYSTEMS:  A complete 10 point review of system is done which is negative except mentioned above/history of present illness.   PAST MEDICAL HISTORY :  Past Medical History:  Diagnosis Date  . Aortic stenosis   . CAD (coronary artery disease)    a. NSTEMI 1/19 - LHC 1/19: ostial LAD 75%, proximal LAD 80%, ostial LCx 75%, mid LCX 75%, OM2 80%, ostial RCA 99%, mid RCA 95%, distal RCA 99%, med Rx  . Diabetes mellitus without complication ([place]   . Hypertension   . Ischemic cardiomyopathy    a. TTE 1/19: EF of 30-35%, hypokinesis of the anterior, anteroseptal, and apical myocardium, Gr1DD, moderate aortic stenosis (may be under estimated due to his cardiomyopathy), RV systolic function normal, left atrium normal size, unable to estimate PASP  . Myelodysplastic syndrome ([place]     PAST SURGICAL HISTORY :   Past Surgical History:  Procedure Laterality Date  . LEFT HEART CATH AND CORONARY ANGIOGRAPHY N/A 08/16/2017   Procedure: LEFT HEART CATH AND CORONARY ANGIOGRAPHY;  Surgeon: GMinna Merritts MD;  [facility];  Service: Cardiovascular;  Laterality: N/A;    FAMILY HISTORY :   Family History  Problem Relation Age of Onset  . Hypertension Father     SOCIAL HISTORY:   Social History   Tobacco Use  . Smoking status: Former Smoker    Packs/day: 1.00    Types: Cigarettes    Last attempt to quit: 08/13/1968    Years since quitting: 49.4  . Smokeless tobacco: Never Used  Substance Use Topics  . Alcohol use: No    Frequency: Never  . Drug use: No  ALLERGIES:  is allergic to penicillins.  MEDICATIONS:  Current Outpatient Medications  Medication Sig Dispense Refill  . albuterol (PROVENTIL HFA;VENTOLIN HFA) 108 (90 Base) MCG/ACT inhaler Inhale 2 puffs into the lungs every 6 (six) hours as needed for wheezing or shortness of breath. 1 Inhaler 2  . aspirin EC 81 MG EC tablet Take 1 tablet (81 mg total) by mouth daily. 30 tablet 0  . bisacodyl  (DULCOLAX) 5 MG EC tablet Take 1 tablet (5 mg total) by mouth daily as needed for moderate constipation. 30 tablet 0  . carvedilol (COREG) 3.125 MG tablet Take 1 tablet (3.125 mg total) by mouth 2 (two) times daily. 180 tablet 3  . clopidogrel (PLAVIX) 75 MG tablet Take 1 tablet (75 mg total) by mouth daily. 90 tablet 3  . Darbepoetin Alfa (ARANESP) 500 MCG/ML SOSY injection Inject 500 mcg into the skin every 30 (thirty) days.    Marland Kitchen docusate sodium (COLACE) 100 MG capsule Take 1 capsule (100 mg total) by mouth daily as needed. 30 capsule 2  . fluticasone (FLONASE) 50 MCG/ACT nasal spray Place 2 sprays into the nose daily.    . isosorbide mononitrate (IMDUR) 30 MG 24 hr tablet Take 1 tablet (30 mg total) by mouth daily. 30 tablet 3  . lisinopril (PRINIVIL,ZESTRIL) 10 MG tablet Take 1 tablet (10 mg total) by mouth daily. 30 tablet 3  . metoprolol succinate (TOPROL-XL) 25 MG 24 hr tablet Take 12.5 mg by mouth daily.    . Multiple Vitamin (MULTI-VITAMINS) TABS Take 1 tablet by mouth daily.    . nitroGLYCERIN (NITROSTAT) 0.4 MG SL tablet Place 1 tablet (0.4 mg total) under the tongue every 5 (five) minutes as needed for chest pain. 25 tablet 3  . senna-docusate (SENOKOT-S) 8.6-50 MG tablet Take 2 tablets by mouth daily as needed for mild constipation. 30 tablet 1  . simvastatin (ZOCOR) 10 MG tablet Take 10 mg by mouth daily.    Marland Kitchen triamcinolone cream (KENALOG) 0.1 % Apply 1 application topically 2 (two) times daily.     No current facility-administered medications for this visit.    Facility-Administered Medications Ordered in Other Visits  Medication Dose Route Frequency Provider Last Rate Last Dose  . Darbepoetin Alfa (ARANESP) injection 500 mcg  500 mcg Subcutaneous Weekly Charlaine Dalton R, MD   500 mcg at 12/27/17 1048  . Darbepoetin Alfa (ARANESP) injection 500 mcg  500 mcg Subcutaneous Weekly Charlaine Dalton R, MD   500 mcg at 01/17/18 1229    PHYSICAL EXAMINATION: ECOG PERFORMANCE  STATUS: 1 - Symptomatic but completely ambulatory  BP 129/66 (BP Location: Left Arm, Patient Position: Sitting)   Pulse (!) 52   Temp (!) 97.2 F (36.2 C) (Tympanic)   Resp 20   Wt 159 lb 11.2 oz (72.4 kg)   SpO2 98%   BMI 21.66 kg/m   Filed Weights   01/17/18 1126  Weight: 159 lb 11.2 oz (72.4 kg)   GENERAL: frail, elderly gentleman. alert, no distress and comfortable. Accompanied by wife. In wheelchair.  EYES: no pallor or icterus OROPHARYNX: no thrush or ulceration NECK: supple; no lymph nodes felt LUNGS: Decreased breath sounds auscultation bilaterally. No wheeze or crackles HEART/CVS: regular rate & rhythm and no murmurs; No lower extremity edema ABDOMEN: abdomen soft, non-tender and normal bowel sounds. No hepatomegaly or splenomegaly.  Musculoskeletal: no cyanosis of digits and no clubbing  PSYCH: alert & oriented x 3 with fluent speech NEURO: no focal motor/sensory deficits SKIN: no rashes or significant  lesions   LABORATORY DATA:  I have reviewed the data as listed    Component Value Date/Time   NA 138 01/17/2018 1046   NA 140 05/27/2014 1418   K 4.5 01/17/2018 1046   K 3.8 05/27/2014 1418   CL 107 01/17/2018 1046   CL 106 05/27/2014 1418   CO2 24 01/17/2018 1046   CO2 28 05/27/2014 1418   GLUCOSE 155 (H) 01/17/2018 1046   GLUCOSE 166 (H) 05/27/2014 1418   BUN 21 (H) 01/17/2018 1046   BUN 18 05/27/2014 1418   CREATININE 0.96 01/17/2018 1046   CREATININE 1.24 05/27/2014 1418   CALCIUM 9.3 01/17/2018 1046   CALCIUM 9.1 05/27/2014 1418   PROT 6.7 01/17/2018 1046   PROT 7.2 05/27/2014 1418   ALBUMIN 3.6 01/17/2018 1046   ALBUMIN 3.5 05/27/2014 1418   AST 19 01/17/2018 1046   AST 25 05/27/2014 1418   ALT 10 (L) 01/17/2018 1046   ALT 23 05/27/2014 1418   ALKPHOS 80 01/17/2018 1046   ALKPHOS 113 05/27/2014 1418   BILITOT 0.5 01/17/2018 1046   BILITOT 0.7 05/27/2014 1418   GFRNONAA >60 01/17/2018 1046   GFRNONAA 59 (L) 05/27/2014 1418   GFRAA >60  01/17/2018 1046   GFRAA >60 05/27/2014 1418    No results found for: SPEP, UPEP  Lab Results  Component Value Date   WBC 2.1 (L) 01/17/2018   NEUTROABS 0.6 (L) 01/17/2018   HGB 9.6 (L) 01/17/2018   HCT 28.3 (L) 01/17/2018   MCV 109.2 (H) 01/17/2018   PLT 82 (L) 01/17/2018      Chemistry      Component Value Date/Time   NA 138 01/17/2018 1046   NA 140 05/27/2014 1418   K 4.5 01/17/2018 1046   K 3.8 05/27/2014 1418   CL 107 01/17/2018 1046   CL 106 05/27/2014 1418   CO2 24 01/17/2018 1046   CO2 28 05/27/2014 1418   BUN 21 (H) 01/17/2018 1046   BUN 18 05/27/2014 1418   CREATININE 0.96 01/17/2018 1046   CREATININE 1.24 05/27/2014 1418      Component Value Date/Time   CALCIUM 9.3 01/17/2018 1046   CALCIUM 9.1 05/27/2014 1418   ALKPHOS 80 01/17/2018 1046   ALKPHOS 113 05/27/2014 1418   AST 19 01/17/2018 1046   AST 25 05/27/2014 1418   ALT 10 (L) 01/17/2018 1046   ALT 23 05/27/2014 1418   BILITOT 0.5 01/17/2018 1046   BILITOT 0.7 05/27/2014 1418       RADIOGRAPHIC STUDIES: I have personally reviewed the radiological images as listed and agreed with the findings in the report. No results found.   ASSESSMENT & PLAN:  1. MDS- predominant anemia; intermittent leucopenia and moderate thrombocytopenia. Plt 82 today. Aranesp today and continue every 3 weeks. Hmg 9.6. No transfusion today. Would consider transfusion if hmg < 8 and symptomatic. Wbc today 2.1, ANC 0.6. Again discussed role of bone marrow biopsy and he wishes to avoid at this time. Will continue to monitor.    rtc in 3 weeks for lab & aranesp then again in 6 weeks for lab & re-evaluation & re-consideration of aranesp by Dr. Rogue Bussing.    Orders Placed This Encounter  Procedures  . CBC with Differential/Platelet    Standing Status:   Standing    Number of Occurrences:   20    Standing Expiration Date:   01/25/2019  . Comprehensive metabolic panel    Standing Status:   Standing    Number  of Occurrences:    20    Standing Expiration Date:   01/25/2019   All questions were answered. The patient knows to call the clinic with any problems, questions or concerns.    Beckey Rutter, DNP, AGNP-C [place] at [facility] [phone] (work cell) [phone] (office) 01/24/18 4:57 PM

## 2018-01-20 ENCOUNTER — Telehealth: Payer: Self-pay | Admitting: Cardiovascular Disease

## 2018-01-20 ENCOUNTER — Other Ambulatory Visit: Payer: Self-pay

## 2018-01-20 MED ORDER — LISINOPRIL 10 MG PO TABS: 10.0000 mg | ORAL_TABLET | Freq: Every day | ORAL | 0 refills | Status: DC

## 2018-01-20 MED ORDER — ISOSORBIDE MONONITRATE ER 30 MG PO TB24: 30.0000 mg | ORAL_TABLET | Freq: Every day | ORAL | 0 refills | Status: DC

## 2018-01-20 NOTE — Telephone Encounter (Signed)
°*  STAT* If patient is at the pharmacy, call can be transferred to refill team.   1. Which medications need to be refilled? (please list name of each medication and dose if known)  lisinporil 10 MG 1 tablet daily Isosorbide (IMDUR) 30 MG - 1 tablet daily    2. Which pharmacy/location (including street and city if local pharmacy) is medication to be sent to? [facility] on [address]  3. Do they need a 30 day or 90 day supply? 90 day   Patient at [facility] currently

## 2018-01-20 NOTE — Telephone Encounter (Signed)
Requested Prescriptions   Signed Prescriptions Disp Refills  . lisinopril (PRINIVIL,ZESTRIL) 10 MG tablet 90 tablet 0    Sig: Take 1 tablet (10 mg total) by mouth daily.    Authorizing Provider: Minna Merritts    Ordering User: Janan Ridge isosorbide mononitrate (IMDUR) 30 MG 24 hr tablet 90 tablet 0    Sig: Take 1 tablet (30 mg total) by mouth daily.    Authorizing Provider: Minna Merritts    Ordering User: Janan Ridge

## 2018-01-24 ENCOUNTER — Encounter: Payer: Self-pay | Admitting: Emergency Medicine

## 2018-01-24 ENCOUNTER — Emergency Department
Admission: EM | Admit: 2018-01-24 | Discharge: 2018-01-24 | Disposition: A | Discharge status: Home / Self Care | Payer: Medicare Other | Attending: Emergency Medicine | Admitting: Emergency Medicine

## 2018-01-24 ENCOUNTER — Other Ambulatory Visit: Payer: Self-pay

## 2018-01-24 DIAGNOSIS — E119 Type 2 diabetes mellitus without complications: Secondary | ICD-10-CM | POA: Diagnosis not present

## 2018-01-24 DIAGNOSIS — I2510 Atherosclerotic heart disease of native coronary artery without angina pectoris: Secondary | ICD-10-CM | POA: Insufficient documentation

## 2018-01-24 DIAGNOSIS — Z79899 Other long term (current) drug therapy: Secondary | ICD-10-CM | POA: Diagnosis not present

## 2018-01-24 DIAGNOSIS — Z7982 Long term (current) use of aspirin: Secondary | ICD-10-CM | POA: Diagnosis not present

## 2018-01-24 DIAGNOSIS — I110 Hypertensive heart disease with heart failure: Secondary | ICD-10-CM | POA: Diagnosis not present

## 2018-01-24 DIAGNOSIS — Z7902 Long term (current) use of antithrombotics/antiplatelets: Secondary | ICD-10-CM | POA: Diagnosis not present

## 2018-01-24 DIAGNOSIS — I5041 Acute combined systolic (congestive) and diastolic (congestive) heart failure: Secondary | ICD-10-CM | POA: Insufficient documentation

## 2018-01-24 DIAGNOSIS — I252 Old myocardial infarction: Secondary | ICD-10-CM | POA: Diagnosis not present

## 2018-01-24 DIAGNOSIS — Z87891 Personal history of nicotine dependence: Secondary | ICD-10-CM | POA: Insufficient documentation

## 2018-01-24 DIAGNOSIS — K5641 Fecal impaction: Secondary | ICD-10-CM | POA: Diagnosis not present

## 2018-01-24 DIAGNOSIS — K5900 Constipation, unspecified: Secondary | ICD-10-CM | POA: Diagnosis present

## 2018-01-24 NOTE — ED Notes (Signed)
Pt states constipation since Monday, pt states last BM on Monday. Pt states hx of constipation and has been seen for it. Pt c/o rectal pain from feel like he needs to go but cannot. Pt states he has meds at home to help with constipation and they cause him to have a bowel movement "but it's still hard".

## 2018-01-24 NOTE — ED Notes (Signed)
Disimpaction performed by MD. Large amounts of stool removed at this time.

## 2018-01-24 NOTE — ED Notes (Signed)
Pt verbalized to this RN that he was able to have a BM by self and feels better.

## 2018-01-24 NOTE — ED Triage Notes (Signed)
ARrives for c/o rectal pain and constipation. Last BM was Monday, which patient states was normal.  States symptoms started today.  Has been seen through the ED in the past for the same.

## 2018-01-24 NOTE — ED Provider Notes (Signed)
[facility] Emergency Department Provider Note ____________________________________________   First MD Initiated Contact with Patient 01/24/18 1814     (approximate)  I have reviewed the triage vital signs and the nursing notes.   HISTORY  Chief Complaint Constipation  HPI Terry Gonzales is a 84 y.o. male with a history of CAD on aspirin and Plavix as well as a history of constipation with disimpaction earlier this year who is presenting to the emergency department having not moved his bowels since this past Monday.  He says that he takes senna as well as Colace daily at home and has still not been able to move his bowels.  Says that he does not drink as much water as he should.  Also tried prune juice this morning without relief.  Says that he is having pain in his rectum and feels like the stool is just about the exit but he cannot push it out.  Says that he also has some mild abdominal pressure to the lower abdomen.  Past Medical History:  Diagnosis Date  . Aortic stenosis   . CAD (coronary artery disease)    a. NSTEMI 1/19 - LHC 1/19: ostial LAD 75%, proximal LAD 80%, ostial LCx 75%, mid LCX 75%, OM2 80%, ostial RCA 99%, mid RCA 95%, distal RCA 99%, med Rx  . Diabetes mellitus without complication ([place])   . Hypertension   . Ischemic cardiomyopathy    a. TTE 1/19: EF of 30-35%, hypokinesis of the anterior, anteroseptal, and apical myocardium, Gr1DD, moderate aortic stenosis (may be under estimated due to his cardiomyopathy), RV systolic function normal, left atrium normal size, unable to estimate PASP  . Myelodysplastic syndrome [facility])     Patient Active Problem List   Diagnosis Date Noted  . CAD (coronary artery disease), native coronary artery 10/21/2017  . Chest pain 09/01/2017  . MDS (myelodysplastic syndrome), low grade ([place]) 08/20/2017  . NSTEMI (non-ST elevated myocardial infarction) ([place])   . Acute combined systolic and diastolic heart failure  ([place])   . Severe aortic stenosis   . Pneumonia 08/14/2017    Past Surgical History:  Procedure Laterality Date  . LEFT HEART CATH AND CORONARY ANGIOGRAPHY N/A 08/16/2017   Procedure: LEFT HEART CATH AND CORONARY ANGIOGRAPHY;  Surgeon: Minna Merritts, MD;  Location: [facility] [facility] LAB;  Service: Cardiovascular;  Laterality: N/A;    Prior to Admission medications   Medication Sig Start Date End Date Taking? Authorizing Provider  albuterol (PROVENTIL HFA;VENTOLIN HFA) 108 (90 Base) MCG/ACT inhaler Inhale 2 puffs into the lungs every 6 (six) hours as needed for wheezing or shortness of breath. 08/19/17   Vaughan Basta, MD  aspirin EC 81 MG EC tablet Take 1 tablet (81 mg total) by mouth daily. 08/20/17   Vaughan Basta, MD  bisacodyl (DULCOLAX) 5 MG EC tablet Take 1 tablet (5 mg total) by mouth daily as needed for moderate constipation. 08/19/17   Vaughan Basta, MD  carvedilol (COREG) 3.125 MG tablet Take 1 tablet (3.125 mg total) by mouth 2 (two) times daily. 09/23/17 01/17/18  Rise Mu, PA-C  clopidogrel (PLAVIX) 75 MG tablet Take 1 tablet (75 mg total) by mouth daily. 09/05/17   Minna Merritts, MD  Darbepoetin Alfa (ARANESP) 500 MCG/ML SOSY injection Inject 500 mcg into the skin every 30 (thirty) days.    [provider]  docusate sodium (COLACE) 100 MG capsule Take 1 capsule (100 mg total) by mouth daily as needed. 09/04/17 09/04/18  Lenise Arena  E, MD  fluticasone (FLONASE) 50 MCG/ACT nasal spray Place 2 sprays into the nose daily. 02/20/16 01/17/18  [provider]  isosorbide mononitrate (IMDUR) 30 MG 24 hr tablet Take 1 tablet (30 mg total) by mouth daily. 01/20/18   Minna Merritts, MD  lisinopril (PRINIVIL,ZESTRIL) 10 MG tablet Take 1 tablet (10 mg total) by mouth daily. 01/20/18   Minna Merritts, MD  metoprolol succinate (TOPROL-XL) 25 MG 24 hr tablet Take 12.5 mg by mouth daily. 09/05/17   [provider]  Multiple Vitamin  (MULTI-VITAMINS) TABS Take 1 tablet by mouth daily. 02/24/03   [provider]  nitroGLYCERIN (NITROSTAT) 0.4 MG SL tablet Place 1 tablet (0.4 mg total) under the tongue every 5 (five) minutes as needed for chest pain. 08/29/17   Minna Merritts, MD  senna-docusate (SENOKOT-S) 8.6-50 MG tablet Take 2 tablets by mouth daily as needed for mild constipation. 09/04/17 09/04/18  Earleen Newport, MD  simvastatin (ZOCOR) 10 MG tablet Take 10 mg by mouth daily. 05/06/17   [provider]  triamcinolone cream (KENALOG) 0.1 % Apply 1 application topically 2 (two) times daily.    [provider]    Allergies Penicillins  Family History  Problem Relation Age of Onset  . Hypertension Father     Social History Social History   Tobacco Use  . Smoking status: Former Smoker    Packs/day: 1.00    Types: Cigarettes    Last attempt to quit: 08/13/1968    Years since quitting: 49.4  . Smokeless tobacco: Never Used  Substance Use Topics  . Alcohol use: No    Frequency: Never  . Drug use: No    Review of Systems  Constitutional: No fever/chills Eyes: No visual changes. ENT: No sore throat. Cardiovascular: Denies chest pain. Respiratory: Denies shortness of breath. Gastrointestinal:  No nausea, no vomiting.  No diarrhea.   Genitourinary: Negative for dysuria. Musculoskeletal: Negative for back pain. Skin: Negative for rash. Neurological: Negative for headaches, focal weakness or numbness.   ____________________________________________   PHYSICAL EXAM:  VITAL SIGNS: ED Triage Vitals [01/24/18 1751]  Enc Vitals Group     BP (!) 154/67     Pulse Rate 68     Resp 18     Temp 97.6 F (36.4 C)     Temp Source Oral     SpO2 93 %     Weight 159 lb (72.1 kg)     Height      Head Circumference      Peak Flow      Pain Score 7     Pain Loc      Pain Edu?      Excl. in [place]?     Constitutional: Alert and oriented. Well appearing and in no acute  distress. Eyes: Conjunctivae are normal.  Head: Atraumatic. Nose: No congestion/rhinnorhea. Mouth/Throat: Mucous membranes are moist.  Neck: No stridor.   Cardiovascular: Normal rate, regular rhythm. Grossly normal heart sounds.   Respiratory: Normal respiratory effort.  No retractions. Lungs CTAB. Gastrointestinal: Soft and nontender. No distention.  Musculoskeletal: No lower extremity tenderness nor edema.  No joint effusions. Neurologic:  Normal speech and language. No gross focal neurologic deficits are appreciated. Skin:  Skin is warm, dry and intact. No rash noted. Psychiatric: Mood and affect are normal. Speech and behavior are normal.  ____________________________________________   LABS (all labs ordered are listed, but only abnormal results are displayed)  Labs Reviewed - No data to display  ____________________________________________  EKG   ____________________________________________  RADIOLOGY   ____________________________________________   PROCEDURES  Procedure(s) performed:  ------------------------------------------------------------------------------------------------------------------- Fecal Disimpaction Procedure Note:  Performed by me:  Patient placed in the lateral recumbent position with knees drawn towards chest. Nurse present for patient support. Large amount of hard brown stool removed. No complications during procedure.   ------------------------------------------------------------------------------------------------------------------    Procedures  Critical Care performed:   ____________________________________________   INITIAL IMPRESSION / ASSESSMENT AND PLAN / ED COURSE  Pertinent labs & imaging results that were available during my care of the patient were reviewed by me and considered in my medical decision making (see chart for details).  DDX: Constipation, fecal impaction, abdominal pain As part of my medical decision  making, I reviewed the following data within the electronic MEDICAL RECORD NUMBER Notes from prior ED visits  Large amount of stool disimpacted.  Patient trying to move his bowels at this time.  Says that he feels much relief and less pressure after the disimpaction.  ----------------------------------------- 7:54 PM on 01/24/2018 -----------------------------------------  Patient with relief of his pain.  Was able to move bowels after the disimpaction.  We will continue with Colace and senna.  We discussed increasing water intake as well as making sure that he has plenty fibrinous type and will be discharged at this time. ____________________________________________   FINAL CLINICAL IMPRESSION(S) / ED DIAGNOSES  Fecal impaction    NEW MEDICATIONS STARTED DURING THIS VISIT:  New Prescriptions   No medications on file     Note:  This document was prepared using Dragon voice recognition software and may include unintentional dictation errors.     Orbie Pyo, MD 01/24/18 [phone]

## 2018-02-07 ENCOUNTER — Inpatient Hospital Stay: Payer: Medicare Other

## 2018-02-07 ENCOUNTER — Other Ambulatory Visit: Payer: Self-pay

## 2018-02-07 DIAGNOSIS — D4620 Refractory anemia with excess of blasts, unspecified: Secondary | ICD-10-CM | POA: Diagnosis not present

## 2018-02-07 LAB — COMPREHENSIVE METABOLIC PANEL
ALK PHOS: 84 U/L (ref 38–126)
ALT: 9 U/L (ref 0–44)
AST: 19 U/L (ref 15–41)
Albumin: 3.8 g/dL (ref 3.5–5.0)
Anion gap: 6 (ref 5–15)
BUN: 27 mg/dL — ABNORMAL HIGH (ref 8–23)
CALCIUM: 9.6 mg/dL (ref 8.9–10.3)
CO2: 24 mmol/L (ref 22–32)
CREATININE: 0.84 mg/dL (ref 0.61–1.24)
Chloride: 110 mmol/L (ref 98–111)
Glucose, Bld: 137 mg/dL — ABNORMAL HIGH (ref 70–99)
Potassium: 4.1 mmol/L (ref 3.5–5.1)
Sodium: 140 mmol/L (ref 135–145)
TOTAL PROTEIN: 6.7 g/dL (ref 6.5–8.1)
Total Bilirubin: 0.9 mg/dL (ref 0.3–1.2)

## 2018-02-07 LAB — CBC WITH DIFFERENTIAL/PLATELET
BASOS PCT: 0 %
Basophils Absolute: 0 10*3/uL (ref 0–0.1)
EOS ABS: 0 10*3/uL (ref 0–0.7)
Eosinophils Relative: 1 %
HCT: 31.7 % — ABNORMAL LOW (ref 40.0–52.0)
HEMOGLOBIN: 10.6 g/dL — AB (ref 13.0–18.0)
Lymphocytes Relative: 40 %
Lymphs Abs: 0.8 10*3/uL — ABNORMAL LOW (ref 1.0–3.6)
MCH: 37.1 pg — ABNORMAL HIGH (ref 26.0–34.0)
MCHC: 33.4 g/dL (ref 32.0–36.0)
MCV: 111.1 fL — ABNORMAL HIGH (ref 80.0–100.0)
Monocytes Absolute: 0.6 10*3/uL (ref 0.2–1.0)
Monocytes Relative: 27 %
NEUTROS PCT: 32 %
Neutro Abs: 0.7 10*3/uL — ABNORMAL LOW (ref 1.4–6.5)
Platelets: 80 10*3/uL — ABNORMAL LOW (ref 150–440)
RBC: 2.85 MIL/uL — AB (ref 4.40–5.90)
RDW: 16.4 % — ABNORMAL HIGH (ref 11.5–14.5)
WBC: 2.1 10*3/uL — ABNORMAL LOW (ref 3.8–10.6)

## 2018-02-28 ENCOUNTER — Inpatient Hospital Stay (HOSPITAL_BASED_OUTPATIENT_CLINIC_OR_DEPARTMENT_OTHER): Payer: Medicare Other | Admitting: Internal Medicine

## 2018-02-28 ENCOUNTER — Inpatient Hospital Stay: Payer: Medicare Other | Attending: Internal Medicine

## 2018-02-28 ENCOUNTER — Inpatient Hospital Stay: Payer: Medicare Other

## 2018-02-28 VITALS — BP 106/54 | HR 59 | Temp 97.9°F | Resp 20 | Ht 72.0 in | Wt 159.0 lb

## 2018-02-28 DIAGNOSIS — E119 Type 2 diabetes mellitus without complications: Secondary | ICD-10-CM

## 2018-02-28 DIAGNOSIS — I255 Ischemic cardiomyopathy: Secondary | ICD-10-CM | POA: Diagnosis not present

## 2018-02-28 DIAGNOSIS — I350 Nonrheumatic aortic (valve) stenosis: Secondary | ICD-10-CM | POA: Diagnosis not present

## 2018-02-28 DIAGNOSIS — I110 Hypertensive heart disease with heart failure: Secondary | ICD-10-CM

## 2018-02-28 DIAGNOSIS — Z7982 Long term (current) use of aspirin: Secondary | ICD-10-CM | POA: Diagnosis not present

## 2018-02-28 DIAGNOSIS — I509 Heart failure, unspecified: Secondary | ICD-10-CM | POA: Insufficient documentation

## 2018-02-28 DIAGNOSIS — D4620 Refractory anemia with excess of blasts, unspecified: Secondary | ICD-10-CM | POA: Diagnosis not present

## 2018-02-28 DIAGNOSIS — I252 Old myocardial infarction: Secondary | ICD-10-CM

## 2018-02-28 DIAGNOSIS — R05 Cough: Secondary | ICD-10-CM

## 2018-02-28 DIAGNOSIS — G8929 Other chronic pain: Secondary | ICD-10-CM | POA: Insufficient documentation

## 2018-02-28 DIAGNOSIS — R531 Weakness: Secondary | ICD-10-CM | POA: Insufficient documentation

## 2018-02-28 DIAGNOSIS — I2510 Atherosclerotic heart disease of native coronary artery without angina pectoris: Secondary | ICD-10-CM | POA: Insufficient documentation

## 2018-02-28 DIAGNOSIS — R0602 Shortness of breath: Secondary | ICD-10-CM | POA: Insufficient documentation

## 2018-02-28 DIAGNOSIS — Z79899 Other long term (current) drug therapy: Secondary | ICD-10-CM | POA: Insufficient documentation

## 2018-02-28 DIAGNOSIS — R5383 Other fatigue: Secondary | ICD-10-CM

## 2018-02-28 DIAGNOSIS — Z87891 Personal history of nicotine dependence: Secondary | ICD-10-CM | POA: Diagnosis not present

## 2018-02-28 LAB — CBC WITH DIFFERENTIAL/PLATELET
Basophils Absolute: 0 10*3/uL (ref 0–0.1)
Basophils Relative: 1 %
Eosinophils Absolute: 0 10*3/uL (ref 0–0.7)
Eosinophils Relative: 1 %
HEMATOCRIT: 25.5 % — AB (ref 40.0–52.0)
HEMOGLOBIN: 8.6 g/dL — AB (ref 13.0–18.0)
LYMPHS ABS: 0.9 10*3/uL — AB (ref 1.0–3.6)
Lymphocytes Relative: 33 %
MCH: 36.9 pg — AB (ref 26.0–34.0)
MCHC: 33.9 g/dL (ref 32.0–36.0)
MCV: 109.1 fL — AB (ref 80.0–100.0)
MONO ABS: 0.6 10*3/uL (ref 0.2–1.0)
Monocytes Relative: 24 %
NEUTROS ABS: 1.2 10*3/uL — AB (ref 1.4–6.5)
Neutrophils Relative %: 41 %
Platelets: 96 10*3/uL — ABNORMAL LOW (ref 150–440)
RBC: 2.33 MIL/uL — ABNORMAL LOW (ref 4.40–5.90)
RDW: 15.5 % — AB (ref 11.5–14.5)
WBC: 2.7 10*3/uL — ABNORMAL LOW (ref 3.8–10.6)

## 2018-02-28 LAB — FERRITIN: FERRITIN: 432 ng/mL — AB (ref 24–336)

## 2018-02-28 LAB — IRON AND TIBC
Iron: 83 ug/dL (ref 45–182)
Saturation Ratios: 35 % (ref 17.9–39.5)
TIBC: 240 ug/dL — AB (ref 250–450)
UIBC: 157 ug/dL

## 2018-02-28 LAB — COMPREHENSIVE METABOLIC PANEL
ALK PHOS: 89 U/L (ref 38–126)
ALT: 11 U/L (ref 0–44)
ANION GAP: 9 (ref 5–15)
AST: 18 U/L (ref 15–41)
Albumin: 3.9 g/dL (ref 3.5–5.0)
BUN: 28 mg/dL — ABNORMAL HIGH (ref 8–23)
CALCIUM: 9.4 mg/dL (ref 8.9–10.3)
CHLORIDE: 111 mmol/L (ref 98–111)
CO2: 22 mmol/L (ref 22–32)
Creatinine, Ser: 1.01 mg/dL (ref 0.61–1.24)
GFR calc Af Amer: >60 mL/min (ref >60)
GFR calc non Af Amer: >60 mL/min (ref >60)
GLUCOSE: 101 mg/dL — AB (ref 70–99)
Potassium: 4.1 mmol/L (ref 3.5–5.1)
SODIUM: 142 mmol/L (ref 135–145)
Total Bilirubin: 0.7 mg/dL (ref 0.3–1.2)
Total Protein: 6.9 g/dL (ref 6.5–8.1)

## 2018-02-28 LAB — BRAIN NATRIURETIC PEPTIDE: B Natriuretic Peptide: 377 pg/mL — ABNORMAL HIGH (ref 0.0–100.0)

## 2018-02-28 MED ORDER — DARBEPOETIN ALFA 500 MCG/ML IJ SOSY
500.0000 ug | PREFILLED_SYRINGE | INTRAMUSCULAR | Status: DC
  Administered 2018-02-28: 500 ug via SUBCUTANEOUS
  Filled 2018-02-28: qty 1

## 2018-02-28 NOTE — Assessment & Plan Note (Addendum)
#   Intermediate grade MDS-predominant anemia; intermittent leucopenia/ moderate thrombocytopenia 80-100.  Slight worsening of anemia/neutropenia noted; June 2019 peripheral blood flow cytometry negative for any blasts.  Discussed that bone marrow would be essential to confirm transformation.  Patient/family reluctant.  #For now continue Aranesp every 3 weeks; today hemoglobin is 8.3.  Check iron studies.  Would recommend IV iron.  # SOB/orthopnea worsened-however patient/family declined increasing the diuretics even for few days.  Defer to PCP/cardiology.  # CAD- s/p recent NSTEMI- s/p PRBC transfusion approximately 3-4 months ago.  #Chronic joint pains continue Tylenol as needed.  # cbc/aranesp in 3 weeks; and again in 6 weeks/ MD/labs/aranesp; check BNP today; Iron studies/ferritin  Cc; Dr.Warshaw/ Dr.Zolotor.

## 2018-02-28 NOTE — Progress Notes (Signed)
[place] OFFICE PROGRESS NOTE  Patient Care Team: Zolotor, Audie Clear, MD as PCP - General Rogue Bussing Elisha Headland, MD as Medical Oncologist (Medical Oncology)  Cancer Staging No matching staging information was found for the patient.   Oncology History   DEC 2017- 2017- MDS-anemia prominent- anemia, responsive to darbepoietin, which he started in January 2018. darbepoietin with zyrtec  [Dr.Ma];  # April 2019- peripheral blood-NEG for blasts    # CAD- NSTEMI-jan 2019/ AS [Dr.Gollan]/ CHF   ------------------------------------------------ --  DEC 2017- Dr.Ma[Bone marrow, right iliac, aspiration and biopsy- -  Hypercellular bone marrow (80%) with trilineage hematopoiesis showing mild morphologic dyspoiesis and no increase in blasts (See Comment; 2% blasts) -  Routine cytogenetic results reveal a normal karyotype; see details below (case [id number]). -  Myeloid mutation panel results reveal variants detected in genes ZRSR2, TET2, and EZH2; see details below (]-  # Variants of potential clinical significance (ASCO/AMP/CAP Tier II): ZRSR2:  c.473delC [p.Pro177f]   TET2:  c.507dupT [p.Asn170Ter]  Variants of uncertain clinical significance (ASCO/AMP/CAP Tier III): TET2:  c.3928_3042del [p.Lys1310_Asp1314del] EZH2:  c.2022G>T [p.Leu674Phe]  No variants detected in ASXL1, BCOR, CEBPA, DNMT3A, ETV6/TEL, FLT3, IDH1, IDH2, KIT, NPM1, NRAS, RUNX1, SF3B1, SRSF2, STAG2, TP53, U2AF1, WT1  --------------------------------------------------    DIAGNOSIS: MDS  STAGE/Grade: intermediate  ;GOALS: control/pallaitive  CURRENT/MOST RECENT THERAPY; Aranesp          MDS (myelodysplastic syndrome), low grade (HCC)      INTERVAL HISTORY:  Terry Branscom89y.[id number].  male pleasant patient above history of myelodysplastic syndrome low-grade with predominant anemia currently on Aranesp is here for follow-up.  Patient complains of ongoing shortness of breath which is chronic.   States is worsened.  States that noted to have difficulty breathing worse at nighttime.  Positive for mild cough no hemoptysis.  No fever chills.  Appetite is fair.  No weight loss.  No weight gain.  Review of Systems  Constitutional: Positive for malaise/fatigue. Negative for chills, diaphoresis, fever and weight loss.  HENT: Negative for nosebleeds and sore throat.   Eyes: Negative for double vision.  Respiratory: Positive for shortness of breath. Negative for cough, hemoptysis, sputum production and wheezing.   Cardiovascular: Negative for chest pain, palpitations, orthopnea and leg swelling.  Gastrointestinal: Negative for abdominal pain, blood in stool, constipation, diarrhea, heartburn, melena, nausea and vomiting.  Genitourinary: Negative for dysuria, frequency and urgency.  Musculoskeletal: Positive for back pain and joint pain.  Skin: Negative.  Negative for itching and rash.  Neurological: Negative for dizziness, tingling, focal weakness, weakness and headaches.  Endo/Heme/Allergies: Does not bruise/bleed easily.  Psychiatric/Behavioral: Negative for depression. The patient is not nervous/anxious and does not have insomnia.       PAST MEDICAL HISTORY :  Past Medical History:  Diagnosis Date  . Aortic stenosis   . CAD (coronary artery disease)    a. NSTEMI 1/19 - LHC 1/19: ostial LAD 75%, proximal LAD 80%, ostial LCx 75%, mid LCX 75%, OM2 80%, ostial RCA 99%, mid RCA 95%, distal RCA 99%, med Rx  . Diabetes mellitus without complication ([place]   . Hypertension   . Ischemic cardiomyopathy    a. TTE 1/19: EF of 30-35%, hypokinesis of the anterior, anteroseptal, and apical myocardium, Gr1DD, moderate aortic stenosis (may be under estimated due to his cardiomyopathy), RV systolic function normal, left atrium normal size, unable to estimate PASP  . [place]     PAST SURGICAL HISTORY :   Past Surgical History:  Procedure  Laterality Date  . LEFT HEART CATH  AND CORONARY ANGIOGRAPHY N/A 08/16/2017   Procedure: LEFT HEART CATH AND CORONARY ANGIOGRAPHY;  Surgeon: Minna Merritts, MD;  Location: [facility];  Service: Cardiovascular;  Laterality: N/A;    FAMILY HISTORY :   Family History  Problem Relation Age of Onset  . Hypertension Father     SOCIAL HISTORY:   Social History   Tobacco Use  . Smoking status: Former Smoker    Packs/day: 1.00    Types: Cigarettes    Last attempt to quit: 08/13/1968    Years since quitting: 49.6  . Smokeless tobacco: Never Used  Substance Use Topics  . Alcohol use: No    Frequency: Never  . Drug use: No    ALLERGIES:  is allergic to penicillins.  MEDICATIONS:  Current Outpatient Medications  Medication Sig Dispense Refill  . albuterol (PROVENTIL HFA;VENTOLIN HFA) 108 (90 Base) MCG/ACT inhaler Inhale 2 puffs into the lungs every 6 (six) hours as needed for wheezing or shortness of breath. 1 Inhaler 2  . aspirin EC 81 MG EC tablet Take 1 tablet (81 mg total) by mouth daily. 30 tablet 0  . bisacodyl (DULCOLAX) 5 MG EC tablet Take 1 tablet (5 mg total) by mouth daily as needed for moderate constipation. 30 tablet 0  . carvedilol (COREG) 3.125 MG tablet Take 1 tablet (3.125 mg total) by mouth 2 (two) times daily. 180 tablet 3  . clopidogrel (PLAVIX) 75 MG tablet Take 1 tablet (75 mg total) by mouth daily. 90 tablet 3  . Darbepoetin Alfa (ARANESP) 500 MCG/ML SOSY injection Inject 500 mcg into the skin every 30 (thirty) days.    Marland Kitchen docusate sodium (COLACE) 100 MG capsule Take 1 capsule (100 mg total) by mouth daily as needed. 30 capsule 2  . fluticasone (FLONASE) 50 MCG/ACT nasal spray Place 2 sprays into the nose daily.    Marland Kitchen lisinopril (PRINIVIL,ZESTRIL) 10 MG tablet Take 1 tablet (10 mg total) by mouth daily. 90 tablet 0  . metoprolol succinate (TOPROL-XL) 25 MG 24 hr tablet Take 12.5 mg by mouth daily.    . Multiple Vitamin (MULTI-VITAMINS) TABS Take 1 tablet by mouth daily.    Marland Kitchen senna-docusate  (SENOKOT-S) 8.6-50 MG tablet Take 2 tablets by mouth daily as needed for mild constipation. 30 tablet 1  . simvastatin (ZOCOR) 10 MG tablet Take 10 mg by mouth daily.    Marland Kitchen triamcinolone cream (KENALOG) 0.1 % Apply 1 application topically 2 (two) times daily.    . isosorbide mononitrate (IMDUR) 30 MG 24 hr tablet Take 1 tablet (30 mg total) by mouth daily. 90 tablet 0  . nitroGLYCERIN (NITROSTAT) 0.4 MG SL tablet Place 1 tablet (0.4 mg total) under the tongue every 5 (five) minutes as needed for chest pain. (Patient not taking: Reported on 02/28/2018) 25 tablet 3   No current facility-administered medications for this visit.    Facility-Administered Medications Ordered in Other Visits  Medication Dose Route Frequency Provider Last Rate Last Dose  . Darbepoetin Alfa (ARANESP) injection 500 mcg  500 mcg Subcutaneous Weekly Charlaine Dalton R, MD   500 mcg at 12/27/17 1048    PHYSICAL EXAMINATION: ECOG PERFORMANCE STATUS: 2 - Symptomatic, <50% confined to bed  BP (!) 106/54   Pulse (!) 59   Temp 97.9 F (36.6 C) (Tympanic)   Resp 20   Ht 6' (1.829 m)   Wt 159 lb (72.1 kg)   BMI 21.56 kg/m   [employer]  02/28/18 1038  Weight: 159 lb (72.1 kg)    GENERAL: Mechele Claude Caucasian male patient in a wheelchair.  Alert, no distress and comfortable.  Accompanied by his family. EYES: no pallor or icterus OROPHARYNX: no thrush or ulceration; NECK: supple; no lymph nodes felt. LYMPH:  no palpable lymphadenopathy in the axillary or inguinal regions LUNGS: Decreased breath sounds auscultation bilaterally. No wheeze or crackles HEART/CVS: regular rate & rhythm and no murmurs; No lower extremity edema ABDOMEN:abdomen soft, non-tender and normal bowel sounds. No hepatomegaly or splenomegaly.  Musculoskeletal:no cyanosis of digits and no clubbing  PSYCH: alert & oriented x 3 with fluent speech NEURO: no focal motor/sensory deficits SKIN:  no rashes or significant  lesions    LABORATORY DATA:  I have reviewed the data as listed    Component Value Date/Time   NA 142 02/28/2018 1002   NA 140 05/27/2014 1418   K 4.1 02/28/2018 1002   K 3.8 05/27/2014 1418   CL 111 02/28/2018 1002   CL 106 05/27/2014 1418   CO2 22 02/28/2018 1002   CO2 28 05/27/2014 1418   GLUCOSE 101 (H) 02/28/2018 1002   GLUCOSE 166 (H) 05/27/2014 1418   BUN 28 (H) 02/28/2018 1002   BUN 18 05/27/2014 1418   CREATININE 1.01 02/28/2018 1002   CREATININE 1.24 05/27/2014 1418   CALCIUM 9.4 02/28/2018 1002   CALCIUM 9.1 05/27/2014 1418   PROT 6.9 02/28/2018 1002   PROT 7.2 05/27/2014 1418   ALBUMIN 3.9 02/28/2018 1002   ALBUMIN 3.5 05/27/2014 1418   AST 18 02/28/2018 1002   AST 25 05/27/2014 1418   ALT 11 02/28/2018 1002   ALT 23 05/27/2014 1418   ALKPHOS 89 02/28/2018 1002   ALKPHOS 113 05/27/2014 1418   BILITOT 0.7 02/28/2018 1002   BILITOT 0.7 05/27/2014 1418   GFRNONAA >60 02/28/2018 1002   GFRNONAA 59 (L) 05/27/2014 1418   GFRAA >60 02/28/2018 1002   GFRAA >60 05/27/2014 1418    No results found for: SPEP, UPEP  Lab Results  Component Value Date   WBC 2.7 (L) 02/28/2018   NEUTROABS 1.2 (L) 02/28/2018   HGB 8.6 (L) 02/28/2018   HCT 25.5 (L) 02/28/2018   MCV 109.1 (H) 02/28/2018   PLT 96 (L) 02/28/2018      Chemistry      Component Value Date/Time   NA 142 02/28/2018 1002   NA 140 05/27/2014 1418   K 4.1 02/28/2018 1002   K 3.8 05/27/2014 1418   CL 111 02/28/2018 1002   CL 106 05/27/2014 1418   CO2 22 02/28/2018 1002   CO2 28 05/27/2014 1418   BUN 28 (H) 02/28/2018 1002   BUN 18 05/27/2014 1418   CREATININE 1.01 02/28/2018 1002   CREATININE 1.24 05/27/2014 1418      Component Value Date/Time   CALCIUM 9.4 02/28/2018 1002   CALCIUM 9.1 05/27/2014 1418   ALKPHOS 89 02/28/2018 1002   ALKPHOS 113 05/27/2014 1418   AST 18 02/28/2018 1002   AST 25 05/27/2014 1418   ALT 11 02/28/2018 1002   ALT 23 05/27/2014 1418   BILITOT 0.7 02/28/2018 1002    BILITOT 0.7 05/27/2014 1418       RADIOGRAPHIC STUDIES: I have personally reviewed the radiological images as listed and agreed with the findings in the report. No results found.   ASSESSMENT & PLAN:  MDS (myelodysplastic syndrome), low grade (HCC) # Intermediate grade MDS-predominant anemia; intermittent leucopenia/ moderate thrombocytopenia 80-100.  Slight worsening of anemia/neutropenia noted;  June 2019 peripheral blood flow cytometry negative for any blasts.  Discussed that bone marrow would be essential to confirm transformation.  Patient/family reluctant.  #For now continue Aranesp every 3 weeks; today hemoglobin is 8.3.  Check iron studies.  Would recommend IV iron.  # SOB/orthopnea worsened-however patient/family declined increasing the diuretics even for few days.  Defer to PCP/cardiology.  # CAD- s/p recent NSTEMI- s/p PRBC transfusion approximately 3-4 months ago.  #Chronic joint pains continue Tylenol as needed.  # cbc/aranesp in 3 weeks; and again in 6 weeks/ MD/labs/aranesp; check BNP today; Iron studies/ferritin  Cc; Dr.Warshaw/ Dr.Zolotor.    Orders Placed This Encounter  Procedures  . Iron and TIBC    Standing Status:   Future    Number of Occurrences:   1    Standing Expiration Date:   03/01/2019  . Ferritin    Standing Status:   Future    Number of Occurrences:   1    Standing Expiration Date:   03/01/2019  . Brain natriuretic peptide    Standing Status:   Future    Number of Occurrences:   1    Standing Expiration Date:   03/01/2019  . CBC with Differential/Platelet    Standing Status:   Standing    Number of Occurrences:   5    Standing Expiration Date:   03/01/2019  . Comprehensive metabolic panel    Standing Status:   Standing    Number of Occurrences:   5    Standing Expiration Date:   03/01/2019   All questions were answered. The patient knows to call the clinic with any problems, questions or concerns.      Cammie Sickle,  MD 03/08/2018 1:17 PM

## 2018-03-21 ENCOUNTER — Inpatient Hospital Stay: Payer: Medicare Other | Attending: Internal Medicine

## 2018-03-21 ENCOUNTER — Other Ambulatory Visit: Payer: Self-pay

## 2018-03-21 ENCOUNTER — Other Ambulatory Visit: Payer: Self-pay | Admitting: Internal Medicine

## 2018-03-21 ENCOUNTER — Inpatient Hospital Stay: Payer: Medicare Other

## 2018-03-21 VITALS — BP 125/61

## 2018-03-21 DIAGNOSIS — D4620 Refractory anemia with excess of blasts, unspecified: Secondary | ICD-10-CM

## 2018-03-21 DIAGNOSIS — Z79899 Other long term (current) drug therapy: Secondary | ICD-10-CM | POA: Insufficient documentation

## 2018-03-21 LAB — CBC WITH DIFFERENTIAL/PLATELET
Basophils Absolute: 0 10*3/uL (ref 0–0.1)
Basophils Relative: 1 %
EOS ABS: 0 10*3/uL (ref 0–0.7)
EOS PCT: 2 %
HCT: 26.7 % — ABNORMAL LOW (ref 40.0–52.0)
Hemoglobin: 9 g/dL — ABNORMAL LOW (ref 13.0–18.0)
LYMPHS ABS: 0.8 10*3/uL — AB (ref 1.0–3.6)
LYMPHS PCT: 36 %
MCH: 37.2 pg — AB (ref 26.0–34.0)
MCHC: 33.7 g/dL (ref 32.0–36.0)
MCV: 110.2 fL — AB (ref 80.0–100.0)
MONO ABS: 0.6 10*3/uL (ref 0.2–1.0)
MONOS PCT: 27 %
Neutro Abs: 0.8 10*3/uL — ABNORMAL LOW (ref 1.4–6.5)
Neutrophils Relative %: 34 %
PLATELETS: 97 10*3/uL — AB (ref 150–440)
RBC: 2.43 MIL/uL — ABNORMAL LOW (ref 4.40–5.90)
RDW: 16.5 % — ABNORMAL HIGH (ref 11.5–14.5)
WBC: 2.2 10*3/uL — ABNORMAL LOW (ref 3.8–10.6)

## 2018-03-21 LAB — COMPREHENSIVE METABOLIC PANEL
ALBUMIN: 3.9 g/dL (ref 3.5–5.0)
ALT: 10 U/L (ref 0–44)
AST: 18 U/L (ref 15–41)
Alkaline Phosphatase: 85 U/L (ref 38–126)
Anion gap: 6 (ref 5–15)
BUN: 27 mg/dL — AB (ref 8–23)
CHLORIDE: 108 mmol/L (ref 98–111)
CO2: 26 mmol/L (ref 22–32)
CREATININE: 0.89 mg/dL (ref 0.61–1.24)
Calcium: 9.4 mg/dL (ref 8.9–10.3)
GFR calc Af Amer: >60 mL/min (ref >60)
GFR calc non Af Amer: >60 mL/min (ref >60)
GLUCOSE: 95 mg/dL (ref 70–99)
POTASSIUM: 4.3 mmol/L (ref 3.5–5.1)
SODIUM: 140 mmol/L (ref 135–145)
Total Bilirubin: 0.7 mg/dL (ref 0.3–1.2)
Total Protein: 6.8 g/dL (ref 6.5–8.1)

## 2018-03-21 MED ORDER — DARBEPOETIN ALFA 500 MCG/ML IJ SOSY
500.0000 ug | PREFILLED_SYRINGE | INTRAMUSCULAR | Status: DC
  Administered 2018-03-21: 500 ug via SUBCUTANEOUS
  Filled 2018-03-21: qty 1

## 2018-04-11 ENCOUNTER — Encounter: Payer: Self-pay | Admitting: Internal Medicine

## 2018-04-11 ENCOUNTER — Other Ambulatory Visit: Payer: Self-pay

## 2018-04-11 ENCOUNTER — Inpatient Hospital Stay: Payer: Medicare Other

## 2018-04-11 ENCOUNTER — Inpatient Hospital Stay: Payer: Medicare Other | Admitting: Internal Medicine

## 2018-04-11 VITALS — BP 146/69 | HR 51 | Temp 97.6°F | Resp 20 | Ht 72.0 in | Wt 162.0 lb

## 2018-04-11 DIAGNOSIS — D4620 Refractory anemia with excess of blasts, unspecified: Secondary | ICD-10-CM

## 2018-04-11 DIAGNOSIS — Z79899 Other long term (current) drug therapy: Secondary | ICD-10-CM | POA: Diagnosis not present

## 2018-04-11 LAB — CBC WITH DIFFERENTIAL/PLATELET
BASOS ABS: 0 10*3/uL (ref 0–0.1)
Basophils Relative: 1 %
EOS PCT: 1 %
Eosinophils Absolute: 0 10*3/uL (ref 0–0.7)
HCT: 26.8 % — ABNORMAL LOW (ref 40.0–52.0)
Hemoglobin: 9.1 g/dL — ABNORMAL LOW (ref 13.0–18.0)
LYMPHS ABS: 0.9 10*3/uL — AB (ref 1.0–3.6)
Lymphocytes Relative: 36 %
MCH: 37.5 pg — AB (ref 26.0–34.0)
MCHC: 33.8 g/dL (ref 32.0–36.0)
MCV: 110.8 fL — AB (ref 80.0–100.0)
MONO ABS: 0.7 10*3/uL (ref 0.2–1.0)
MONOS PCT: 27 %
Neutro Abs: 0.9 10*3/uL — ABNORMAL LOW (ref 1.4–6.5)
Neutrophils Relative %: 35 %
PLATELETS: 88 10*3/uL — AB (ref 150–440)
RBC: 2.42 MIL/uL — ABNORMAL LOW (ref 4.40–5.90)
RDW: 16 % — AB (ref 11.5–14.5)
WBC: 2.5 10*3/uL — ABNORMAL LOW (ref 3.8–10.6)

## 2018-04-11 LAB — COMPREHENSIVE METABOLIC PANEL
ALBUMIN: 3.7 g/dL (ref 3.5–5.0)
ALT: 10 U/L (ref 0–44)
AST: 18 U/L (ref 15–41)
Alkaline Phosphatase: 90 U/L (ref 38–126)
Anion gap: 8 (ref 5–15)
BUN: 22 mg/dL (ref 8–23)
CHLORIDE: 107 mmol/L (ref 98–111)
CO2: 24 mmol/L (ref 22–32)
CREATININE: 0.88 mg/dL (ref 0.61–1.24)
Calcium: 9.4 mg/dL (ref 8.9–10.3)
GFR calc Af Amer: >60 mL/min (ref >60)
GLUCOSE: 116 mg/dL — AB (ref 70–99)
POTASSIUM: 3.9 mmol/L (ref 3.5–5.1)
Sodium: 139 mmol/L (ref 135–145)
Total Bilirubin: 0.8 mg/dL (ref 0.3–1.2)
Total Protein: 6.9 g/dL (ref 6.5–8.1)

## 2018-04-11 MED ORDER — DARBEPOETIN ALFA 500 MCG/ML IJ SOSY
500.0000 ug | PREFILLED_SYRINGE | INTRAMUSCULAR | Status: DC
  Administered 2018-04-11: 500 ug via SUBCUTANEOUS
  Filled 2018-04-11: qty 1

## 2018-04-11 NOTE — Progress Notes (Signed)
[place] OFFICE PROGRESS NOTE  Patient Care Team: Zolotor, Audie Clear, MD as PCP - General Rogue Bussing Elisha Headland, MD as Medical Oncologist (Medical Oncology)  Cancer Staging No matching staging information was found for the patient.   Oncology History   DEC 2017- 2017- MDS-anemia prominent- anemia, responsive to darbepoietin, which he started in January 2018. darbepoietin with zyrtec  [Dr.Ma];  # April 2019- peripheral blood-NEG for blasts    # CAD- NSTEMI-jan 2019/ AS [Dr.Gollan]/ CHF   ------------------------------------------------ --  DEC 2017- Dr.Ma[Bone marrow, right iliac, aspiration and biopsy- -  Hypercellular bone marrow (80%) with trilineage hematopoiesis showing mild morphologic dyspoiesis and no increase in blasts (See Comment; 2% blasts) -  Routine cytogenetic results reveal a normal karyotype; see details below (case [id number]). -  Myeloid mutation panel results reveal variants detected in genes ZRSR2, TET2, and EZH2; see details below (]-  # Variants of potential clinical significance (ASCO/AMP/CAP Tier II): ZRSR2:  c.473delC [p.Pro120f]   TET2:  c.507dupT [p.Asn170Ter]  Variants of uncertain clinical significance (ASCO/AMP/CAP Tier III): TET2:  c.3928_3042del [p.Lys1310_Asp1314del] EZH2:  c.2022G>T [p.Leu674Phe]  No variants detected in ASXL1, BCOR, CEBPA, DNMT3A, ETV6/TEL, FLT3, IDH1, IDH2, KIT, NPM1, NRAS, RUNX1, SF3B1, SRSF2, STAG2, TP53, U2AF1, WT1  --------------------------------------------------    DIAGNOSIS: MDS  STAGE/Grade: intermediate  ;GOALS: control/pallaitive  CURRENT/MOST RECENT THERAPY; Aranesp          MDS (myelodysplastic syndrome), low grade (HCC)      INTERVAL HISTORY:  Terry [id number].o.  male pleasant patient above history of myelodysplastic syndrome low-grade with predominant anemia currently on Aranesp is here for follow-up.  Patient continues to complain of chronic mild shortness of breath; not  any worse.  No worsening cough or hemoptysis.  No fever chills.  Patient recently noted to have worsening bilateral shoulder pain; evaluated by PCP.  Awaiting physical therapy.  Review of Systems  Constitutional: Positive for malaise/fatigue. Negative for chills, diaphoresis, fever and weight loss.  HENT: Negative for nosebleeds and sore throat.   Eyes: Negative for double vision.  Respiratory: Positive for shortness of breath. Negative for cough, hemoptysis, sputum production and wheezing.   Cardiovascular: Negative for chest pain, palpitations, orthopnea and leg swelling.  Gastrointestinal: Negative for abdominal pain, blood in stool, constipation, diarrhea, heartburn, melena, nausea and vomiting.  Genitourinary: Negative for dysuria, frequency and urgency.  Musculoskeletal: Positive for back pain and joint pain.  Skin: Negative.  Negative for itching and rash.  Neurological: Negative for dizziness, tingling, focal weakness, weakness and headaches.  Endo/Heme/Allergies: Does not bruise/bleed easily.  Psychiatric/Behavioral: Negative for depression. The patient is not nervous/anxious and does not have insomnia.       PAST MEDICAL HISTORY :  Past Medical History:  Diagnosis Date  . Aortic stenosis   . CAD (coronary artery disease)    a. NSTEMI 1/19 - LHC 1/19: ostial LAD 75%, proximal LAD 80%, ostial LCx 75%, mid LCX 75%, OM2 80%, ostial RCA 99%, mid RCA 95%, distal RCA 99%, med Rx  . [place] complication [place]   . Hypertension   . Ischemic cardiomyopathy    a. TTE 1/19: EF of 30-35%, hypokinesis of the anterior, anteroseptal, and apical myocardium, Gr1DD, moderate aortic stenosis (may be under estimated due to his cardiomyopathy), RV systolic function normal, left atrium normal size, unable to estimate PASP  . Myelodysplastic syndrome ([place]     PAST SURGICAL HISTORY :   Past Surgical History:  Procedure Laterality Date  . LEFT HEART CATH AND  CORONARY ANGIOGRAPHY  N/A 08/16/2017   Procedure: LEFT HEART CATH AND CORONARY ANGIOGRAPHY;  Surgeon: Minna Merritts, MD;  Location: [facility];  Service: Cardiovascular;  Laterality: N/A;    FAMILY HISTORY :   Family History  Problem Relation Age of Onset  . Hypertension Father     SOCIAL HISTORY:   Social History   Tobacco Use  . Smoking status: Former Smoker    Packs/day: 1.00    Types: Cigarettes    Last attempt to quit: 08/13/1968    Years since quitting: 49.7  . Smokeless tobacco: Never Used  Substance Use Topics  . Alcohol use: No    Frequency: Never  . Drug use: No    ALLERGIES:  is allergic to penicillins.  MEDICATIONS:  Current Outpatient Medications  Medication Sig Dispense Refill  . aspirin EC 81 MG EC tablet Take 1 tablet (81 mg total) by mouth daily. 30 tablet 0  . bisacodyl (DULCOLAX) 5 MG EC tablet Take 1 tablet (5 mg total) by mouth daily as needed for moderate constipation. 30 tablet 0  . carvedilol (COREG) 3.125 MG tablet Take 1 tablet (3.125 mg total) by mouth 2 (two) times daily. 180 tablet 3  . clopidogrel (PLAVIX) 75 MG tablet Take 1 tablet (75 mg total) by mouth daily. 90 tablet 3  . Darbepoetin Alfa (ARANESP) 500 MCG/ML SOSY injection Inject 500 mcg into the skin every 30 (thirty) days.    Marland Kitchen docusate sodium (COLACE) 100 MG capsule Take 1 capsule (100 mg total) by mouth daily as needed. 30 capsule 2  . fluticasone (FLONASE) 50 MCG/ACT nasal spray Place 2 sprays into the nose daily.    Marland Kitchen lisinopril (PRINIVIL,ZESTRIL) 10 MG tablet Take 1 tablet (10 mg total) by mouth daily. 90 tablet 0  . metoprolol succinate (TOPROL-XL) 25 MG 24 hr tablet Take 12.5 mg by mouth daily.    . Multiple Vitamin (MULTI-VITAMINS) TABS Take 1 tablet by mouth daily.    Marland Kitchen senna-docusate (SENOKOT-S) 8.6-50 MG tablet Take 2 tablets by mouth daily as needed for mild constipation. 30 tablet 1  . simvastatin (ZOCOR) 10 MG tablet Take 10 mg by mouth daily.    Marland Kitchen triamcinolone cream (KENALOG) 0.1 %  Apply 1 application topically 2 (two) times daily.    Marland Kitchen albuterol (PROVENTIL HFA;VENTOLIN HFA) 108 (90 Base) MCG/ACT inhaler Inhale 2 puffs into the lungs every 6 (six) hours as needed for wheezing or shortness of breath. (Patient not taking: Reported on 04/11/2018) 1 Inhaler 2  . isosorbide mononitrate (IMDUR) 30 MG 24 hr tablet TAKE 1 TABLET BY MOUTH ONCE DAILY 90 tablet 0  . nitroGLYCERIN (NITROSTAT) 0.4 MG SL tablet Place 1 tablet (0.4 mg total) under the tongue every 5 (five) minutes as needed for chest pain. (Patient not taking: Reported on 04/11/2018) 25 tablet 3   No current facility-administered medications for this visit.    Facility-Administered Medications Ordered in Other Visits  Medication Dose Route Frequency Provider Last Rate Last Dose  . Darbepoetin Alfa (ARANESP) injection 500 mcg  500 mcg Subcutaneous Weekly Charlaine Dalton R, MD   500 mcg at 12/27/17 1048    PHYSICAL EXAMINATION: ECOG PERFORMANCE STATUS: 2 - Symptomatic, <50% confined to bed  BP (!) 146/69 (Patient Position: Sitting)   Pulse (!) 51   Temp 97.6 F (36.4 C) (Tympanic)   Resp 20   Ht 6' (1.829 m)   Wt 162 lb (73.5 kg)   BMI 21.97 kg/m   [employer]  04/11/18 1041  Weight: 162 lb (73.5 kg)    Physical Exam  Constitutional: He is oriented to person, place, and time.  Frail appearing; in a wheel chair; with wife.   HENT:  Head: Normocephalic and atraumatic.  Mouth/Throat: Oropharynx is clear and moist. No oropharyngeal exudate.  Eyes: Pupils are equal, round, and reactive to light.  Neck: Normal range of motion. Neck supple.  Cardiovascular: Normal rate and regular rhythm.  Pulmonary/Chest: No respiratory distress. He has no wheezes.  Abdominal: Soft. Bowel sounds are normal. He exhibits no distension and no mass. There is no tenderness. There is no rebound and no guarding.  Musculoskeletal: Normal range of motion. He exhibits no edema or tenderness.  Neurological: He is alert and  oriented to person, place, and time.  Skin: Skin is warm.  Multiple chronic bruises noted.  Psychiatric: Affect normal.       LABORATORY DATA:  I have reviewed the data as listed    Component Value Date/Time   NA 139 04/11/2018 1015   NA 140 05/27/2014 1418   K 3.9 04/11/2018 1015   K 3.8 05/27/2014 1418   CL 107 04/11/2018 1015   CL 106 05/27/2014 1418   CO2 24 04/11/2018 1015   CO2 28 05/27/2014 1418   GLUCOSE 116 (H) 04/11/2018 1015   GLUCOSE 166 (H) 05/27/2014 1418   BUN 22 04/11/2018 1015   BUN 18 05/27/2014 1418   CREATININE 0.88 04/11/2018 1015   CREATININE 1.24 05/27/2014 1418   CALCIUM 9.4 04/11/2018 1015   CALCIUM 9.1 05/27/2014 1418   PROT 6.9 04/11/2018 1015   PROT 7.2 05/27/2014 1418   ALBUMIN 3.7 04/11/2018 1015   ALBUMIN 3.5 05/27/2014 1418   AST 18 04/11/2018 1015   AST 25 05/27/2014 1418   ALT 10 04/11/2018 1015   ALT 23 05/27/2014 1418   ALKPHOS 90 04/11/2018 1015   ALKPHOS 113 05/27/2014 1418   BILITOT 0.8 04/11/2018 1015   BILITOT 0.7 05/27/2014 1418   GFRNONAA >60 04/11/2018 1015   GFRNONAA 59 (L) 05/27/2014 1418   GFRAA >60 04/11/2018 1015   GFRAA >60 05/27/2014 1418    No results found for: SPEP, UPEP  Lab Results  Component Value Date   WBC 2.5 (L) 04/11/2018   NEUTROABS 0.9 (L) 04/11/2018   HGB 9.1 (L) 04/11/2018   HCT 26.8 (L) 04/11/2018   MCV 110.8 (H) 04/11/2018   PLT 88 (L) 04/11/2018      Chemistry      Component Value Date/Time   NA 139 04/11/2018 1015   NA 140 05/27/2014 1418   K 3.9 04/11/2018 1015   K 3.8 05/27/2014 1418   CL 107 04/11/2018 1015   CL 106 05/27/2014 1418   CO2 24 04/11/2018 1015   CO2 28 05/27/2014 1418   BUN 22 04/11/2018 1015   BUN 18 05/27/2014 1418   CREATININE 0.88 04/11/2018 1015   CREATININE 1.24 05/27/2014 1418      Component Value Date/Time   CALCIUM 9.4 04/11/2018 1015   CALCIUM 9.1 05/27/2014 1418   ALKPHOS 90 04/11/2018 1015   ALKPHOS 113 05/27/2014 1418   AST 18 04/11/2018  1015   AST 25 05/27/2014 1418   ALT 10 04/11/2018 1015   ALT 23 05/27/2014 1418   BILITOT 0.8 04/11/2018 1015   BILITOT 0.7 05/27/2014 1418       RADIOGRAPHIC STUDIES: I have personally reviewed the radiological images as listed and agreed with the findings in the report. No results found.  ASSESSMENT & PLAN:  MDS (myelodysplastic syndrome), low grade (HCC) # Intermediate grade MDS-predominant anemia; intermittent leucopenia/ moderate thrombocytopenia 80-100.  Slight worsening of anemia/neutropenia noted; June 2019 peripheral blood flow cytometry negative for any blasts.  Clinically stable.  #For now continue Aranesp every 3 weeks; today hemoglobin is 9.5  Iron sat- 35%; HOLD off IV iron.   # Dental procedure upcoming [given neutropenia]- sep 30th [facility] per pt; I have asked pt/wife to have dentist call our office [has taken amoxicillin previously; ? allergy]  # CAD- s/p recent NSTEMI-stable.  #Chronic joint pains/bilateral shoulder pain stable awaiting physical therapy.  Continue Tylenol as needed.  # cbc/aranesp in 3 weeks; and again in 6 weeks/ MD/labs/aranespCc; Dr.Warshaw/ Dr.Zolotor.    No orders of the defined types were placed in this encounter.  All questions were answered. The patient knows to call the clinic with any problems, questions or concerns.      Cammie Sickle, MD 04/16/2018 9:10 PM

## 2018-04-11 NOTE — Assessment & Plan Note (Addendum)
#   Intermediate grade MDS-predominant anemia; intermittent leucopenia/ moderate thrombocytopenia 80-100.  Slight worsening of anemia/neutropenia noted; June 2019 peripheral blood flow cytometry negative for any blasts.  Clinically stable.  #For now continue Aranesp every 3 weeks; today hemoglobin is 9.5  Iron sat- 35%; HOLD off IV iron.   # Dental procedure upcoming [given neutropenia]- sep 30th [facility] per pt; I have asked pt/wife to have dentist call our office [has taken amoxicillin previously; ? allergy]  # CAD- s/p recent NSTEMI-stable.  #Chronic joint pains/bilateral shoulder pain stable awaiting physical therapy.  Continue Tylenol as needed.  # cbc/aranesp in 3 weeks; and again in 6 weeks/ MD/labs/aranespCc; Dr.Warshaw/ Dr.Zolotor.

## 2018-04-13 ENCOUNTER — Other Ambulatory Visit: Payer: Self-pay | Admitting: Cardiovascular Disease

## 2018-05-02 ENCOUNTER — Inpatient Hospital Stay: Payer: Medicare Other | Attending: Internal Medicine

## 2018-05-02 ENCOUNTER — Inpatient Hospital Stay: Payer: Medicare Other

## 2018-05-02 VITALS — BP 123/62 | HR 52 | Resp 18

## 2018-05-02 DIAGNOSIS — D4620 Refractory anemia with excess of blasts, unspecified: Secondary | ICD-10-CM

## 2018-05-02 DIAGNOSIS — Z79899 Other long term (current) drug therapy: Secondary | ICD-10-CM | POA: Insufficient documentation

## 2018-05-02 DIAGNOSIS — D469 Myelodysplastic syndrome, unspecified: Secondary | ICD-10-CM | POA: Insufficient documentation

## 2018-05-02 LAB — CBC WITH DIFFERENTIAL/PLATELET
Basophils Absolute: 0 10*3/uL (ref 0–0.1)
Basophils Relative: 1 %
EOS ABS: 0 10*3/uL (ref 0–0.7)
Eosinophils Relative: 2 %
HCT: 28.1 % — ABNORMAL LOW (ref 40.0–52.0)
HEMOGLOBIN: 9.5 g/dL — AB (ref 13.0–18.0)
Lymphocytes Relative: 36 %
Lymphs Abs: 0.9 10*3/uL — ABNORMAL LOW (ref 1.0–3.6)
MCH: 38.1 pg — ABNORMAL HIGH (ref 26.0–34.0)
MCHC: 34 g/dL (ref 32.0–36.0)
MCV: 112 fL — ABNORMAL HIGH (ref 80.0–100.0)
Monocytes Absolute: 0.6 10*3/uL (ref 0.2–1.0)
Monocytes Relative: 25 %
NEUTROS PCT: 36 %
Neutro Abs: 0.8 10*3/uL — ABNORMAL LOW (ref 1.4–6.5)
Platelets: 84 10*3/uL — ABNORMAL LOW (ref 150–440)
RBC: 2.51 MIL/uL — ABNORMAL LOW (ref 4.40–5.90)
RDW: 15.8 % — ABNORMAL HIGH (ref 11.5–14.5)
WBC: 2.3 10*3/uL — ABNORMAL LOW (ref 3.8–10.6)

## 2018-05-02 LAB — COMPREHENSIVE METABOLIC PANEL
ALT: 9 U/L (ref 0–44)
AST: 17 U/L (ref 15–41)
Albumin: 3.8 g/dL (ref 3.5–5.0)
Alkaline Phosphatase: 96 U/L (ref 38–126)
Anion gap: 4 — ABNORMAL LOW (ref 5–15)
BILIRUBIN TOTAL: 0.9 mg/dL (ref 0.3–1.2)
BUN: 20 mg/dL (ref 8–23)
CO2: 26 mmol/L (ref 22–32)
CREATININE: 0.81 mg/dL (ref 0.61–1.24)
Calcium: 9.5 mg/dL (ref 8.9–10.3)
Chloride: 108 mmol/L (ref 98–111)
GFR calc non Af Amer: >60 mL/min (ref >60)
Glucose, Bld: 141 mg/dL — ABNORMAL HIGH (ref 70–99)
Potassium: 4.3 mmol/L (ref 3.5–5.1)
Sodium: 138 mmol/L (ref 135–145)
TOTAL PROTEIN: 7 g/dL (ref 6.5–8.1)

## 2018-05-02 MED ORDER — DARBEPOETIN ALFA 500 MCG/ML IJ SOSY
500.0000 ug | PREFILLED_SYRINGE | INTRAMUSCULAR | Status: DC
  Administered 2018-05-02: 500 ug via SUBCUTANEOUS
  Filled 2018-05-02: qty 1

## 2018-05-23 ENCOUNTER — Other Ambulatory Visit: Payer: Self-pay

## 2018-05-23 ENCOUNTER — Encounter: Payer: Self-pay | Admitting: Internal Medicine

## 2018-05-23 ENCOUNTER — Inpatient Hospital Stay: Payer: Medicare Other | Admitting: Internal Medicine

## 2018-05-23 ENCOUNTER — Inpatient Hospital Stay: Payer: Medicare Other

## 2018-05-23 ENCOUNTER — Inpatient Hospital Stay: Payer: Medicare Other | Attending: Internal Medicine

## 2018-05-23 VITALS — BP 133/75 | HR 58 | Temp 97.0°F | Resp 20 | Ht 72.0 in | Wt 166.0 lb

## 2018-05-23 DIAGNOSIS — E119 Type 2 diabetes mellitus without complications: Secondary | ICD-10-CM

## 2018-05-23 DIAGNOSIS — Z7982 Long term (current) use of aspirin: Secondary | ICD-10-CM

## 2018-05-23 DIAGNOSIS — I509 Heart failure, unspecified: Secondary | ICD-10-CM | POA: Insufficient documentation

## 2018-05-23 DIAGNOSIS — D4620 Refractory anemia with excess of blasts, unspecified: Secondary | ICD-10-CM

## 2018-05-23 DIAGNOSIS — I255 Ischemic cardiomyopathy: Secondary | ICD-10-CM | POA: Diagnosis not present

## 2018-05-23 DIAGNOSIS — I350 Nonrheumatic aortic (valve) stenosis: Secondary | ICD-10-CM

## 2018-05-23 DIAGNOSIS — I2510 Atherosclerotic heart disease of native coronary artery without angina pectoris: Secondary | ICD-10-CM | POA: Diagnosis not present

## 2018-05-23 DIAGNOSIS — I252 Old myocardial infarction: Secondary | ICD-10-CM

## 2018-05-23 DIAGNOSIS — Z79899 Other long term (current) drug therapy: Secondary | ICD-10-CM | POA: Insufficient documentation

## 2018-05-23 DIAGNOSIS — Z87891 Personal history of nicotine dependence: Secondary | ICD-10-CM | POA: Diagnosis not present

## 2018-05-23 DIAGNOSIS — R0602 Shortness of breath: Secondary | ICD-10-CM | POA: Insufficient documentation

## 2018-05-23 DIAGNOSIS — R531 Weakness: Secondary | ICD-10-CM | POA: Insufficient documentation

## 2018-05-23 DIAGNOSIS — I110 Hypertensive heart disease with heart failure: Secondary | ICD-10-CM

## 2018-05-23 DIAGNOSIS — M25511 Pain in right shoulder: Secondary | ICD-10-CM

## 2018-05-23 DIAGNOSIS — R5383 Other fatigue: Secondary | ICD-10-CM | POA: Insufficient documentation

## 2018-05-23 DIAGNOSIS — M25512 Pain in left shoulder: Secondary | ICD-10-CM

## 2018-05-23 LAB — CBC WITH DIFFERENTIAL/PLATELET
Abs Immature Granulocytes: 0.06 10*3/uL (ref 0.00–0.07)
BASOS PCT: 0 %
Basophils Absolute: 0 10*3/uL (ref 0.0–0.1)
EOS ABS: 0 10*3/uL (ref 0.0–0.5)
EOS PCT: 0 %
HEMATOCRIT: 29.7 % — AB (ref 39.0–52.0)
Hemoglobin: 9.4 g/dL — ABNORMAL LOW (ref 13.0–17.0)
IMMATURE GRANULOCYTES: 3 %
LYMPHS ABS: 0.9 10*3/uL (ref 0.7–4.0)
Lymphocytes Relative: 38 %
MCH: 35.9 pg — ABNORMAL HIGH (ref 26.0–34.0)
MCHC: 31.6 g/dL (ref 30.0–36.0)
MCV: 113.4 fL — ABNORMAL HIGH (ref 80.0–100.0)
Monocytes Absolute: 0.5 10*3/uL (ref 0.1–1.0)
Monocytes Relative: 20 %
NEUTROS PCT: 39 %
NRBC: 0 % (ref 0.0–0.2)
Neutro Abs: 0.9 10*3/uL — ABNORMAL LOW (ref 1.7–7.7)
PLATELETS: 90 10*3/uL — AB (ref 150–400)
RBC: 2.62 MIL/uL — AB (ref 4.22–5.81)
RDW: 14.4 % (ref 11.5–15.5)
WBC: 2.4 10*3/uL — AB (ref 4.0–10.5)

## 2018-05-23 LAB — COMPREHENSIVE METABOLIC PANEL
ALBUMIN: 4 g/dL (ref 3.5–5.0)
ALK PHOS: 92 U/L (ref 38–126)
ALT: 11 U/L (ref 0–44)
AST: 21 U/L (ref 15–41)
Anion gap: 8 (ref 5–15)
BUN: 26 mg/dL — ABNORMAL HIGH (ref 8–23)
CALCIUM: 9.6 mg/dL (ref 8.9–10.3)
CO2: 23 mmol/L (ref 22–32)
CREATININE: 0.89 mg/dL (ref 0.61–1.24)
Chloride: 109 mmol/L (ref 98–111)
GFR calc Af Amer: >60 mL/min (ref >60)
GFR calc non Af Amer: >60 mL/min (ref >60)
Glucose, Bld: 163 mg/dL — ABNORMAL HIGH (ref 70–99)
Potassium: 3.9 mmol/L (ref 3.5–5.1)
SODIUM: 140 mmol/L (ref 135–145)
TOTAL PROTEIN: 7.1 g/dL (ref 6.5–8.1)
Total Bilirubin: 0.7 mg/dL (ref 0.3–1.2)

## 2018-05-23 MED ORDER — DARBEPOETIN ALFA 500 MCG/ML IJ SOSY
500.0000 ug | PREFILLED_SYRINGE | INTRAMUSCULAR | Status: DC
  Administered 2018-05-23: 500 ug via SUBCUTANEOUS
  Filled 2018-05-23: qty 1

## 2018-05-23 NOTE — Progress Notes (Signed)
[place] OFFICE PROGRESS NOTE  Patient Care Team: Zolotor, Audie Clear, MD as PCP - General Rogue Bussing Elisha Headland, MD as Medical Oncologist (Medical Oncology)  Cancer Staging No matching staging information was found for the patient.   Oncology History   DEC 2017- 2017- MDS-anemia prominent- anemia, responsive to darbepoietin, which he started in January 2018. darbepoietin with zyrtec  [Dr.Ma];  # April 2019- peripheral blood-NEG for blasts    # CAD- NSTEMI-jan 2019/ AS [Dr.Gollan]/ CHF   ------------------------------------------------ --  DEC 2017- Dr.Ma[Bone marrow, right iliac, aspiration and biopsy- -  Hypercellular bone marrow (80%) with trilineage hematopoiesis showing mild morphologic dyspoiesis and no increase in blasts (See Comment; 2% blasts) -  Routine cytogenetic results reveal a normal karyotype; see details below (case [id number]). -  Myeloid mutation panel results reveal variants detected in genes ZRSR2, TET2, and EZH2; see details below (]-  # Variants of potential clinical significance (ASCO/AMP/CAP Tier II): ZRSR2:  c.473delC [p.Pro155f]   TET2:  c.507dupT [p.Asn170Ter]  Variants of uncertain clinical significance (ASCO/AMP/CAP Tier III): TET2:  c.3928_3042del [p.Lys1310_Asp1314del] EZH2:  c.2022G>T [p.Leu674Phe]  No variants detected in ASXL1, BCOR, CEBPA, DNMT3A, ETV6/TEL, FLT3, IDH1, IDH2, KIT, NPM1, NRAS, RUNX1, SF3B1, SRSF2, STAG2, TP53, U2AF1, WT1  --------------------------------------------------    DIAGNOSIS: MDS  STAGE/Grade: intermediate  ;GOALS: control/pallaitive  CURRENT/MOST RECENT THERAPY; Aranesp          MDS (myelodysplastic syndrome), low grade (HCC)      INTERVAL HISTORY:  Terry Shelton89y.o.  male pleasant patient above history of myelodysplastic syndrome low-grade with predominant anemia currently on Aranesp is here for follow-up.  Patient complains of worsening arthritis bilateral shoulder joint pains.   Is currently status post physical therapy.  He notes no significant improvement post physical therapy.  Taking Tylenol once a day.  Chronic shortness of breath.  No worsening or hemoptysis.  No fevers or chills.  Review of Systems  Constitutional: Positive for malaise/fatigue. Negative for chills, diaphoresis, fever and weight loss.  HENT: Negative for nosebleeds and sore throat.   Eyes: Negative for double vision.  Respiratory: Positive for shortness of breath. Negative for cough, hemoptysis, sputum production and wheezing.   Cardiovascular: Negative for chest pain, palpitations, orthopnea and leg swelling.  Gastrointestinal: Negative for abdominal pain, blood in stool, constipation, diarrhea, heartburn, melena, nausea and vomiting.  Genitourinary: Negative for dysuria, frequency and urgency.  Musculoskeletal: Positive for back pain and joint pain.  Skin: Negative.  Negative for itching and rash.  Neurological: Negative for dizziness, tingling, focal weakness, weakness and headaches.  Endo/Heme/Allergies: Does not bruise/bleed easily.  Psychiatric/Behavioral: Negative for depression. The patient is not nervous/anxious and does not have insomnia.       PAST MEDICAL HISTORY :  Past Medical History:  Diagnosis Date  . Aortic stenosis   . CAD (coronary artery disease)    a. NSTEMI 1/19 - LHC 1/19: ostial LAD 75%, proximal LAD 80%, ostial LCx 75%, mid LCX 75%, OM2 80%, ostial RCA 99%, mid RCA 95%, distal RCA 99%, med Rx  . Diabetes mellitus without complication ([place]   . Hypertension   . Ischemic cardiomyopathy    a. TTE 1/19: EF of 30-35%, hypokinesis of the anterior, anteroseptal, and apical myocardium, Gr1DD, moderate aortic stenosis (may be under estimated due to his cardiomyopathy), RV systolic function normal, left atrium normal size, unable to estimate PASP  . Myelodysplastic syndrome ([facility]     PAST SURGICAL HISTORY :   Past Surgical History:  Procedure Laterality Date  .  LEFT  HEART CATH AND CORONARY ANGIOGRAPHY N/A 08/16/2017   Procedure: LEFT HEART CATH AND CORONARY ANGIOGRAPHY;  Surgeon: Minna Merritts, MD;  Location: [place] [facility];  Service: Cardiovascular;  Laterality: N/A;    FAMILY HISTORY :   Family History  Problem Relation Age of Onset  . Hypertension Father     SOCIAL HISTORY:   Social History   Tobacco Use  . Smoking status: Former Smoker    Packs/day: 1.00    Types: Cigarettes    Last attempt to quit: 08/13/1968    Years since quitting: 49.8  . Smokeless tobacco: Never Used  Substance Use Topics  . Alcohol use: No    Frequency: Never  . Drug use: No    ALLERGIES:  is allergic to penicillins.  MEDICATIONS:  Current Outpatient Medications  Medication Sig Dispense Refill  . albuterol (PROVENTIL HFA;VENTOLIN HFA) 108 (90 Base) MCG/ACT inhaler Inhale 2 puffs into the lungs every 6 (six) hours as needed for wheezing or shortness of breath. 1 Inhaler 2  . aspirin EC 81 MG EC tablet Take 1 tablet (81 mg total) by mouth daily. 30 tablet 0  . bisacodyl (DULCOLAX) 5 MG EC tablet Take 1 tablet (5 mg total) by mouth daily as needed for moderate constipation. 30 tablet 0  . carvedilol (COREG) 3.125 MG tablet Take 1 tablet (3.125 mg total) by mouth 2 (two) times daily. 180 tablet 3  . clopidogrel (PLAVIX) 75 MG tablet Take 1 tablet (75 mg total) by mouth daily. 90 tablet 3  . Darbepoetin Alfa (ARANESP) 500 MCG/ML SOSY injection Inject 500 mcg into the skin every 30 (thirty) days.    Marland Kitchen docusate sodium (COLACE) 100 MG capsule Take 1 capsule (100 mg total) by mouth daily as needed. 30 capsule 2  . fluticasone (FLONASE) 50 MCG/ACT nasal spray Place 2 sprays into the nose daily.    . isosorbide mononitrate (IMDUR) 30 MG 24 hr tablet TAKE 1 TABLET BY MOUTH ONCE DAILY 90 tablet 0  . lisinopril (PRINIVIL,ZESTRIL) 10 MG tablet Take 1 tablet (10 mg total) by mouth daily. 90 tablet 0  . metoprolol succinate (TOPROL-XL) 25 MG 24 hr tablet Take 12.5 mg by  mouth daily.    . Multiple Vitamin (MULTI-VITAMINS) TABS Take 1 tablet by mouth daily.    Marland Kitchen senna-docusate (SENOKOT-S) 8.6-50 MG tablet Take 2 tablets by mouth daily as needed for mild constipation. 30 tablet 1  . simvastatin (ZOCOR) 10 MG tablet Take 10 mg by mouth daily.    Marland Kitchen triamcinolone cream (KENALOG) 0.1 % Apply 1 application topically 2 (two) times daily.    . nitroGLYCERIN (NITROSTAT) 0.4 MG SL tablet Place 1 tablet (0.4 mg total) under the tongue every 5 (five) minutes as needed for chest pain. (Patient not taking: Reported on 04/11/2018) 25 tablet 3   No current facility-administered medications for this visit.    Facility-Administered Medications Ordered in Other Visits  Medication Dose Route Frequency Provider Last Rate Last Dose  . Darbepoetin Alfa (ARANESP) injection 500 mcg  500 mcg Subcutaneous Weekly Charlaine Dalton R, MD   500 mcg at 12/27/17 1048    PHYSICAL EXAMINATION: ECOG PERFORMANCE STATUS: 2 - Symptomatic, <50% confined to bed  BP 133/75 (Patient Position: Sitting)   Pulse (!) 58   Temp (!) 97 F (36.1 C) (Tympanic)   Resp 20   Ht 6' (1.829 m)   Wt 166 lb (75.3 kg)   BMI 22.51 kg/m   Filed Weights   05/23/18  1045  Weight: 166 lb (75.3 kg)    Physical Exam  Constitutional: He is oriented to person, place, and time.  Frail appearing; in a wheel chair; with wife.   HENT:  Head: Normocephalic and atraumatic.  Mouth/Throat: Oropharynx is clear and moist. No oropharyngeal exudate.  Eyes: Pupils are equal, round, and reactive to light.  Neck: Normal range of motion. Neck supple.  Cardiovascular: Normal rate and regular rhythm.  Pulmonary/Chest: No respiratory distress. He has no wheezes.  Abdominal: Soft. Bowel sounds are normal. He exhibits no distension and no mass. There is no tenderness. There is no rebound and no guarding.  Musculoskeletal: Normal range of motion. He exhibits no edema or tenderness.  Neurological: He is alert and oriented to  person, place, and time.  Skin: Skin is warm.  Multiple chronic bruises noted.  Psychiatric: Affect normal.       LABORATORY DATA:  I have reviewed the data as listed    Component Value Date/Time   NA 140 05/23/2018 1021   NA 140 05/27/2014 1418   K 3.9 05/23/2018 1021   K 3.8 05/27/2014 1418   CL 109 05/23/2018 1021   CL 106 05/27/2014 1418   CO2 23 05/23/2018 1021   CO2 28 05/27/2014 1418   GLUCOSE 163 (H) 05/23/2018 1021   GLUCOSE 166 (H) 05/27/2014 1418   BUN 26 (H) 05/23/2018 1021   BUN 18 05/27/2014 1418   CREATININE 0.89 05/23/2018 1021   CREATININE 1.24 05/27/2014 1418   CALCIUM 9.6 05/23/2018 1021   CALCIUM 9.1 05/27/2014 1418   PROT 7.1 05/23/2018 1021   PROT 7.2 05/27/2014 1418   ALBUMIN 4.0 05/23/2018 1021   ALBUMIN 3.5 05/27/2014 1418   AST 21 05/23/2018 1021   AST 25 05/27/2014 1418   ALT 11 05/23/2018 1021   ALT 23 05/27/2014 1418   ALKPHOS 92 05/23/2018 1021   ALKPHOS 113 05/27/2014 1418   BILITOT 0.7 05/23/2018 1021   BILITOT 0.7 05/27/2014 1418   GFRNONAA >60 05/23/2018 1021   GFRNONAA 59 (L) 05/27/2014 1418   GFRAA >60 05/23/2018 1021   GFRAA >60 05/27/2014 1418    No results found for: SPEP, UPEP  Lab Results  Component Value Date   WBC 2.4 (L) 05/23/2018   NEUTROABS 0.9 (L) 05/23/2018   HGB 9.4 (L) 05/23/2018   HCT 29.7 (L) 05/23/2018   MCV 113.4 (H) 05/23/2018   PLT 90 (L) 05/23/2018      Chemistry      Component Value Date/Time   NA 140 05/23/2018 1021   NA 140 05/27/2014 1418   K 3.9 05/23/2018 1021   K 3.8 05/27/2014 1418   CL 109 05/23/2018 1021   CL 106 05/27/2014 1418   CO2 23 05/23/2018 1021   CO2 28 05/27/2014 1418   BUN 26 (H) 05/23/2018 1021   BUN 18 05/27/2014 1418   CREATININE 0.89 05/23/2018 1021   CREATININE 1.24 05/27/2014 1418      Component Value Date/Time   CALCIUM 9.6 05/23/2018 1021   CALCIUM 9.1 05/27/2014 1418   ALKPHOS 92 05/23/2018 1021   ALKPHOS 113 05/27/2014 1418   AST 21 05/23/2018 1021    AST 25 05/27/2014 1418   ALT 11 05/23/2018 1021   ALT 23 05/27/2014 1418   BILITOT 0.7 05/23/2018 1021   BILITOT 0.7 05/27/2014 1418       RADIOGRAPHIC STUDIES: I have personally reviewed the radiological images as listed and agreed with the findings in the report. No results  found.   ASSESSMENT & PLAN:  MDS (myelodysplastic syndrome), low grade (HCC) # Intermediate grade MDS-predominant anemia; intermittent leucopenia/ moderate thrombocytopenia 80-100.  S;  June 2019 peripheral blood flow cytometry negative for any blasts.  STABLE.   #For now continue Aranesp every 3 weeks; today hemoglobin is 9.5.  July 2019 iron sat- 35%; HOLD off IV iron.   # Dental procedure upcoming [given neutropenia ANC 900]- sep 30th [facility] per pt; I have asked pt/wife to have dentist call our office [has taken amoxicillin previously; ? Allergy]  # Bone pain/arthritis - worse; recommend taking tylenol three times day PRN.   # CAD/CHF- s/p recent NSTEMI-stable  DISPOSITION: # aranesp today # cbc/aranesp in 3 weeks # follow up with MD in 6 weeks/ MD/labs-cbc/bmp/aranesp-Dr.B   No orders of the defined types were placed in this encounter.  All questions were answered. The patient knows to call the clinic with any problems, questions or concerns.      Cammie Sickle, MD 05/23/2018 9:42 PM

## 2018-05-23 NOTE — Assessment & Plan Note (Addendum)
#   Intermediate grade MDS-predominant anemia; intermittent leucopenia/ moderate thrombocytopenia 80-100.  S;  June 2019 peripheral blood flow cytometry negative for any blasts.  STABLE.   #For now continue Aranesp every 3 weeks; today hemoglobin is 9.5.  July 2019 iron sat- 35%; HOLD off IV iron.   # Dental procedure upcoming [given neutropenia ANC 900]- sep 30th [facility] per pt; I have asked pt/wife to have dentist call our office [has taken amoxicillin previously; ? Allergy]  # Bone pain/arthritis - worse; recommend taking tylenol three times day PRN.   # CAD/CHF- s/p recent NSTEMI-stable  DISPOSITION: # aranesp today # cbc/aranesp in 3 weeks # follow up with MD in 6 weeks/ MD/labs-cbc/bmp/aranesp-Dr.B

## 2018-05-29 ENCOUNTER — Emergency Department: Payer: Medicare Other

## 2018-05-29 ENCOUNTER — Other Ambulatory Visit: Payer: Self-pay

## 2018-05-29 ENCOUNTER — Encounter: Payer: Self-pay | Admitting: Emergency Medicine

## 2018-05-29 ENCOUNTER — Emergency Department
Admission: EM | Admit: 2018-05-29 | Discharge: 2018-05-29 | Disposition: A | Discharge status: Home / Self Care | Payer: Medicare Other | Attending: Emergency Medicine | Admitting: Emergency Medicine

## 2018-05-29 DIAGNOSIS — Z7982 Long term (current) use of aspirin: Secondary | ICD-10-CM | POA: Insufficient documentation

## 2018-05-29 DIAGNOSIS — Z7902 Long term (current) use of antithrombotics/antiplatelets: Secondary | ICD-10-CM | POA: Diagnosis not present

## 2018-05-29 DIAGNOSIS — I2510 Atherosclerotic heart disease of native coronary artery without angina pectoris: Secondary | ICD-10-CM | POA: Insufficient documentation

## 2018-05-29 DIAGNOSIS — Z87891 Personal history of nicotine dependence: Secondary | ICD-10-CM | POA: Insufficient documentation

## 2018-05-29 DIAGNOSIS — E119 Type 2 diabetes mellitus without complications: Secondary | ICD-10-CM | POA: Insufficient documentation

## 2018-05-29 DIAGNOSIS — I10 Essential (primary) hypertension: Secondary | ICD-10-CM | POA: Insufficient documentation

## 2018-05-29 DIAGNOSIS — K5900 Constipation, unspecified: Secondary | ICD-10-CM | POA: Insufficient documentation

## 2018-05-29 DIAGNOSIS — Z79899 Other long term (current) drug therapy: Secondary | ICD-10-CM | POA: Insufficient documentation

## 2018-05-29 DIAGNOSIS — I252 Old myocardial infarction: Secondary | ICD-10-CM | POA: Diagnosis not present

## 2018-05-29 LAB — COMPREHENSIVE METABOLIC PANEL
ALBUMIN: 4.1 g/dL (ref 3.5–5.0)
ALK PHOS: 109 U/L (ref 38–126)
ALT: 16 U/L (ref 0–44)
AST: 25 U/L (ref 15–41)
Anion gap: 10 (ref 5–15)
BILIRUBIN TOTAL: 0.6 mg/dL (ref 0.3–1.2)
BUN: 21 mg/dL (ref 8–23)
CALCIUM: 9.5 mg/dL (ref 8.9–10.3)
CO2: 23 mmol/L (ref 22–32)
Chloride: 102 mmol/L (ref 98–111)
Creatinine, Ser: 0.94 mg/dL (ref 0.61–1.24)
GFR calc Af Amer: >60 mL/min (ref >60)
GFR calc non Af Amer: >60 mL/min (ref >60)
GLUCOSE: 144 mg/dL — AB (ref 70–99)
Potassium: 4.5 mmol/L (ref 3.5–5.1)
SODIUM: 135 mmol/L (ref 135–145)
TOTAL PROTEIN: 7.3 g/dL (ref 6.5–8.1)

## 2018-05-29 LAB — LIPASE, BLOOD: Lipase: 25 U/L (ref 11–51)

## 2018-05-29 LAB — CBC
HCT: 34.4 % — ABNORMAL LOW (ref 39.0–52.0)
HEMOGLOBIN: 10.3 g/dL — AB (ref 13.0–17.0)
MCH: 37.1 pg — AB (ref 26.0–34.0)
MCHC: 29.9 g/dL — AB (ref 30.0–36.0)
MCV: 123.7 fL — AB (ref 80.0–100.0)
PLATELETS: 97 10*3/uL — AB (ref 150–400)
RBC: 2.78 MIL/uL — AB (ref 4.22–5.81)
RDW: 14.7 % (ref 11.5–15.5)
WBC: 3.7 10*3/uL — ABNORMAL LOW (ref 4.0–10.5)
nRBC: 0 % (ref 0.0–0.2)

## 2018-05-29 NOTE — ED Provider Notes (Signed)
[facility] Emergency Department Provider Note  ___________________________________________   First MD Initiated Contact with Patient 05/29/18 2037     (approximate)  I have reviewed the triage vital signs and the nursing notes.   HISTORY  Chief Complaint Abdominal Pain and Constipation   HPI Terry Gonzales is a 84 y.o. male with intermittent constipation was presented to the emergency department today having not been able to move his bowels since this past Monday.  He says that he moved his bowels Monday morning but since then has had difficulty moving his bowels.  However, prior to me evaluating him he says that he had a hard bowel movement and now feels complete relief of his rectal as well as abdominal pain.  Patient says that he has taken MiraLAX at home but has stopped taking his evening Dulcolax.  Also says that he has not been walking that much and does not drink much water.   Past Medical History:  Diagnosis Date  . Aortic stenosis   . CAD (coronary artery disease)    a. NSTEMI 1/19 - LHC 1/19: ostial LAD 75%, proximal LAD 80%, ostial LCx 75%, mid LCX 75%, OM2 80%, ostial RCA 99%, mid RCA 95%, distal RCA 99%, med Rx  . Diabetes mellitus without complication ([place])   . Hypertension   . Ischemic cardiomyopathy    a. TTE 1/19: EF of 30-35%, hypokinesis of the anterior, anteroseptal, and apical myocardium, Gr1DD, moderate aortic stenosis (may be under estimated due to his cardiomyopathy), RV systolic function normal, left atrium normal size, unable to estimate PASP  . Myelodysplastic syndrome [facility])     Patient Active Problem List   Diagnosis Date Noted  . CAD (coronary artery disease), native coronary artery 10/21/2017  . Chest pain 09/01/2017  . MDS (myelodysplastic syndrome), low grade ([place]) 08/20/2017  . NSTEMI (non-ST elevated myocardial infarction) ([place])   . Acute combined systolic and diastolic heart failure ([place])   . Severe aortic stenosis     . Pneumonia 08/14/2017    Past Surgical History:  Procedure Laterality Date  . LEFT HEART CATH AND CORONARY ANGIOGRAPHY N/A 08/16/2017   Procedure: LEFT HEART CATH AND CORONARY ANGIOGRAPHY;  Surgeon: Minna Merritts, MD;  Location: [place] [facility];  Service: Cardiovascular;  Laterality: N/A;    Prior to Admission medications   Medication Sig Start Date End Date Taking? Authorizing Provider  albuterol (PROVENTIL HFA;VENTOLIN HFA) 108 (90 Base) MCG/ACT inhaler Inhale 2 puffs into the lungs every 6 (six) hours as needed for wheezing or shortness of breath. 08/19/17   Vaughan Basta, MD  aspirin EC 81 MG EC tablet Take 1 tablet (81 mg total) by mouth daily. 08/20/17   Vaughan Basta, MD  bisacodyl (DULCOLAX) 5 MG EC tablet Take 1 tablet (5 mg total) by mouth daily as needed for moderate constipation. 08/19/17   Vaughan Basta, MD  carvedilol (COREG) 3.125 MG tablet Take 1 tablet (3.125 mg total) by mouth 2 (two) times daily. 09/23/17 02/28/26  Rise Mu, PA-C  clopidogrel (PLAVIX) 75 MG tablet Take 1 tablet (75 mg total) by mouth daily. 09/05/17   Minna Merritts, MD  Darbepoetin Alfa (ARANESP) 500 MCG/ML SOSY injection Inject 500 mcg into the skin every 30 (thirty) days.    [provider]  docusate sodium (COLACE) 100 MG capsule Take 1 capsule (100 mg total) by mouth daily as needed. 09/04/17 09/04/18  Earleen Newport, MD  fluticasone (FLONASE) 50 MCG/ACT nasal spray Place 2 sprays  into the nose daily. 02/20/16 02/28/26  [provider]  isosorbide mononitrate (IMDUR) 30 MG 24 hr tablet TAKE 1 TABLET BY MOUTH ONCE DAILY 04/15/18   Minna Merritts, MD  lisinopril (PRINIVIL,ZESTRIL) 10 MG tablet Take 1 tablet (10 mg total) by mouth daily. 01/20/18   Minna Merritts, MD  metoprolol succinate (TOPROL-XL) 25 MG 24 hr tablet Take 12.5 mg by mouth daily. 09/05/17   [provider]  Multiple Vitamin (MULTI-VITAMINS) TABS Take 1 tablet by mouth daily.  02/24/03   [provider]  nitroGLYCERIN (NITROSTAT) 0.4 MG SL tablet Place 1 tablet (0.4 mg total) under the tongue every 5 (five) minutes as needed for chest pain. Patient not taking: Reported on 04/11/2018 08/29/17   Minna Merritts, MD  senna-docusate (SENOKOT-S) 8.6-50 MG tablet Take 2 tablets by mouth daily as needed for mild constipation. 09/04/17 09/04/18  Earleen Newport, MD  simvastatin (ZOCOR) 10 MG tablet Take 10 mg by mouth daily. 05/06/17   [provider]  triamcinolone cream (KENALOG) 0.1 % Apply 1 application topically 2 (two) times daily.    [provider]    Allergies Penicillins  Family History  Problem Relation Age of Onset  . Hypertension Father     Social History Social History   Tobacco Use  . Smoking status: Former Smoker    Packs/day: 1.00    Types: Cigarettes    Last attempt to quit: 08/13/1968    Years since quitting: 49.8  . Smokeless tobacco: Never Used  Substance Use Topics  . Alcohol use: No    Frequency: Never  . Drug use: No    Review of Systems  Constitutional: No fever/chills Eyes: No visual changes. ENT: No sore throat. Cardiovascular: Denies chest pain. Respiratory: Denies shortness of breath. Gastrointestinal: As above Genitourinary: Negative for dysuria. Musculoskeletal: Negative for back pain. Skin: Negative for rash. Neurological: Negative for headaches, focal weakness or numbness.   ____________________________________________   PHYSICAL EXAM:  VITAL SIGNS: ED Triage Vitals  Enc Vitals Group     BP 05/29/18 1825 (!) 148/76     Pulse Rate 05/29/18 1825 87     Resp 05/29/18 1825 19     Temp 05/29/18 1825 97.6 F (36.4 C)     Temp Source 05/29/18 1825 Oral     SpO2 05/29/18 1825 100 %     Weight 05/29/18 1825 160 lb (72.6 kg)     Height 05/29/18 1825 6' (1.829 m)     Head Circumference --      Peak Flow --      Pain Score 05/29/18 1836 8     Pain Loc --      Pain Edu? --      Excl.  in [place]? --     Constitutional: Alert and oriented. Well appearing and in no acute distress. Eyes: Conjunctivae are normal.  Head: Atraumatic. Nose: No congestion/rhinnorhea. Mouth/Throat: Mucous membranes are moist.  Neck: No stridor.   Cardiovascular: Normal rate, regular rhythm. Grossly normal heart sounds.   Respiratory: Normal respiratory effort.  No retractions. Lungs CTAB. Gastrointestinal: Soft and nontender. No distention.  Musculoskeletal: No lower extremity tenderness nor edema.  No joint effusions. Neurologic:  Normal speech and language. No gross focal neurologic deficits are appreciated. Skin:  Skin is warm, dry and intact. No rash noted. Psychiatric: Mood and affect are normal. Speech and behavior are normal.  ____________________________________________   LABS (all labs ordered are listed, but only abnormal results are displayed)  Labs Reviewed  COMPREHENSIVE METABOLIC PANEL - Abnormal; Notable for the following components:      Result Value   Glucose, Bld 144 (*)    All other components within normal limits  CBC - Abnormal; Notable for the following components:   WBC 3.7 (*)    RBC 2.78 (*)    Hemoglobin 10.3 (*)    HCT 34.4 (*)    MCV 123.7 (*)    MCH 37.1 (*)    MCHC 29.9 (*)    Platelets 97 (*)    All other components within normal limits  LIPASE, BLOOD  URINALYSIS, COMPLETE (UACMP) WITH MICROSCOPIC   ____________________________________________  EKG   ____________________________________________  RADIOLOGY  KUB with no acute finding. ____________________________________________   PROCEDURES  Procedure(s) performed:   Procedures  Critical Care performed:   ____________________________________________   INITIAL IMPRESSION / ASSESSMENT AND PLAN / ED COURSE  Pertinent labs & imaging results that were available during my care of the patient were reviewed by me and considered in my medical decision making (see chart for  details).  Differential diagnosis includes, but is not limited to, acute appendicitis, renal colic, testicular torsion, urinary tract infection/pyelonephritis, prostatitis,  epididymitis, diverticulitis, small bowel obstruction or ileus, colitis, abdominal aortic aneurysm, gastroenteritis, hernia, etc. As part of my medical decision making, I reviewed the following data within the electronic MEDICAL RECORD NUMBER Notes from prior ED visits  Discussed with patient and family to increase his water intake walk more and try reintegrating his evening Dulcolax into his medication routine.  Patient with relief of symptoms right now after having a large bowel movement in the emergency department today.  Patient to be discharged at this time.  Understanding the diagnosis as well as treatment plan willing to comply. ____________________________________________   FINAL CLINICAL IMPRESSION(S) / ED DIAGNOSES  Constipation.  NEW MEDICATIONS STARTED DURING THIS VISIT:  New Prescriptions   No medications on file     Note:  This document was prepared using Dragon voice recognition software and may include unintentional dictation errors.     Orbie Pyo, MD 05/29/18 [phone]

## 2018-05-29 NOTE — ED Triage Notes (Signed)
Pt in via POV, reports abdominal pain due to no BM since last Monday.  Pt reports taking Miralax, Senokot, Colace w/out any relief.  Vitals WDL, NAD noted at this time.

## 2018-06-13 ENCOUNTER — Inpatient Hospital Stay: Payer: Medicare Other

## 2018-06-13 ENCOUNTER — Inpatient Hospital Stay: Payer: Medicare Other | Attending: Internal Medicine

## 2018-06-13 VITALS — BP 116/62 | HR 58

## 2018-06-13 DIAGNOSIS — Z79899 Other long term (current) drug therapy: Secondary | ICD-10-CM | POA: Diagnosis not present

## 2018-06-13 DIAGNOSIS — D4620 Refractory anemia with excess of blasts, unspecified: Secondary | ICD-10-CM

## 2018-06-13 DIAGNOSIS — D469 Myelodysplastic syndrome, unspecified: Secondary | ICD-10-CM | POA: Diagnosis present

## 2018-06-13 LAB — CBC WITH DIFFERENTIAL/PLATELET
Abs Immature Granulocytes: 0.06 10*3/uL (ref 0.00–0.07)
BASOS ABS: 0 10*3/uL (ref 0.0–0.1)
BASOS PCT: 1 %
EOS PCT: 1 %
Eosinophils Absolute: 0 10*3/uL (ref 0.0–0.5)
HCT: 31.5 % — ABNORMAL LOW (ref 39.0–52.0)
HEMOGLOBIN: 9.8 g/dL — AB (ref 13.0–17.0)
Immature Granulocytes: 2 %
LYMPHS PCT: 39 %
Lymphs Abs: 1 10*3/uL (ref 0.7–4.0)
MCH: 35.6 pg — AB (ref 26.0–34.0)
MCHC: 31.1 g/dL (ref 30.0–36.0)
MCV: 114.5 fL — ABNORMAL HIGH (ref 80.0–100.0)
MONO ABS: 0.6 10*3/uL (ref 0.1–1.0)
Monocytes Relative: 23 %
NEUTROS ABS: 0.9 10*3/uL — AB (ref 1.7–7.7)
NRBC: 0 % (ref 0.0–0.2)
Neutrophils Relative %: 34 %
PLATELETS: 104 10*3/uL — AB (ref 150–400)
RBC: 2.75 MIL/uL — AB (ref 4.22–5.81)
RDW: 14.9 % (ref 11.5–15.5)
WBC: 2.7 10*3/uL — AB (ref 4.0–10.5)

## 2018-06-13 LAB — COMPREHENSIVE METABOLIC PANEL
ALBUMIN: 4 g/dL (ref 3.5–5.0)
ALK PHOS: 95 U/L (ref 38–126)
ALT: 13 U/L (ref 0–44)
ANION GAP: 8 (ref 5–15)
AST: 17 U/L (ref 15–41)
BUN: 24 mg/dL — ABNORMAL HIGH (ref 8–23)
CHLORIDE: 105 mmol/L (ref 98–111)
CO2: 24 mmol/L (ref 22–32)
Calcium: 9.6 mg/dL (ref 8.9–10.3)
Creatinine, Ser: 0.91 mg/dL (ref 0.61–1.24)
GFR calc non Af Amer: >60 mL/min (ref >60)
GLUCOSE: 106 mg/dL — AB (ref 70–99)
Potassium: 4 mmol/L (ref 3.5–5.1)
SODIUM: 137 mmol/L (ref 135–145)
Total Bilirubin: 0.7 mg/dL (ref 0.3–1.2)
Total Protein: 7.1 g/dL (ref 6.5–8.1)

## 2018-06-13 MED ORDER — DARBEPOETIN ALFA 500 MCG/ML IJ SOSY
500.0000 ug | PREFILLED_SYRINGE | INTRAMUSCULAR | Status: DC
  Administered 2018-06-13: 500 ug via SUBCUTANEOUS
  Filled 2018-06-13: qty 1

## 2018-06-24 NOTE — Progress Notes (Signed)
Cardiology Office Note  Date:  06/25/2018   ID:  Terry Gonzales, DOB 1925/02/04, MRN [id number]  PCP:  Zolotor, Audie Clear, MD   Chief Complaint  Patient presents with  . other    6 month f/u c/o chest pain using nitro twice today and daily past 2wks and sob. Meds reviewed verbally with pt.    HPI:  Terry Gonzales is a 84 year old gentleman with past medical history of MDS, hematocrit 25 Moderate to severe aortic stenosis Ejection fraction 30-35% Severe three-vessel coronary disease Acute renal failure Bilateral pneumonia  Presents for follow-up after recent hospitalization after NSTEMI, managed medically  Lab work reviewed hematocrit 31, WBC 2.7, platelets 104 Pancytopenia Creatinine 0.9 BUN 24  In follow-up today he is having more stable anginal symptoms Moving the garbage can having discomfort Try to do his basic ADLs having more chest pain Taking more NTG for the past couple of weeks Most often one a day , sometimes without a nitro  No recent trips to the hospital apart from: Seen in the emergency room May 29, 2018 for abdominal pain constipation  Chronic back pain  Leg cramping, able to tolerate simvastatin 10 mg daily Total cholesterol around 100  EKG personally reviewed by myself on todays visit Shows normal sinus rhythm rate 65 bpm LVH, ST abnormality anterolateral leads, left anterior fascicular block  Other past medical history reviewed hospitalization January 2019 with pneumonia, respiratory distress, renal failure, non-STEMI Troponin up to 25 Cardiac catheterization performed as below  severe three-vessel disease, not a candidate for CABG at the time  Cath 08/16/2017 Severe three vessel disease, ostial LAD, ostial LCX, occluded RCA Also with significant aortic valve stenosis, Depressed EF on echo 30 to 35% Left anterior descending (LAD): Large vessel that extends to the apical region, diagonal branch 2 of moderate size, severe ostial and proximal  disease  Left circumflex (LCx): Large vessel with OM branch 2, moderate to severe ostial disease, severe OM1 and OM2 disease  Right coronary artery (RCA): Right dominant vessel , subtotally occluded ostially, severe mid vessel disease, extasia distal vessel, collaterals from left to right, likely a CTO  Left ventriculography: unable to cross the aortic valve secondary to dilated root, heavy aortic valve calcification, likely moderate to severe, possibly severe aortic valve stenosis   PMH:   has a past medical history of Aortic stenosis, CAD (coronary artery disease), Diabetes mellitus without complication ([place]), Hypertension, Ischemic cardiomyopathy, and Myelodysplastic syndrome ([name]).  PSH:    Past Surgical History:  Procedure Laterality Date  . LEFT HEART CATH AND CORONARY ANGIOGRAPHY N/A 08/16/2017   Procedure: LEFT HEART CATH AND CORONARY ANGIOGRAPHY;  Surgeon: Minna Merritts, MD;  Location: [facility];  Service: Cardiovascular;  Laterality: N/A;    Current Outpatient Medications  Medication Sig Dispense Refill  . albuterol (PROVENTIL HFA;VENTOLIN HFA) 108 (90 Base) MCG/ACT inhaler Inhale 2 puffs into the lungs every 6 (six) hours as needed for wheezing or shortness of breath. 1 Inhaler 2  . aspirin EC 81 MG EC tablet Take 1 tablet (81 mg total) by mouth daily. 30 tablet 0  . bisacodyl (DULCOLAX) 5 MG EC tablet Take 1 tablet (5 mg total) by mouth daily as needed for moderate constipation. 30 tablet 0  . carvedilol (COREG) 3.125 MG tablet Take 1 tablet (3.125 mg total) by mouth 2 (two) times daily. 180 tablet 3  . clopidogrel (PLAVIX) 75 MG tablet Take 1 tablet (75 mg total) by mouth daily. 90 tablet 3  .  Darbepoetin Alfa (ARANESP) 500 MCG/ML SOSY injection Inject 500 mcg into the skin every 30 (thirty) days.    Marland Kitchen docusate sodium (COLACE) 100 MG capsule Take 1 capsule (100 mg total) by mouth daily as needed. 30 capsule 2  . fluticasone (FLONASE) 50 MCG/ACT nasal spray Place  2 sprays into the nose daily.    . isosorbide mononitrate (IMDUR) 30 MG 24 hr tablet TAKE 1 TABLET BY MOUTH ONCE DAILY 90 tablet 0  . lisinopril (PRINIVIL,ZESTRIL) 10 MG tablet Take 1 tablet (10 mg total) by mouth daily. 90 tablet 0  . metoprolol succinate (TOPROL-XL) 25 MG 24 hr tablet Take 12.5 mg by mouth daily.    . Multiple Vitamin (MULTI-VITAMINS) TABS Take 1 tablet by mouth daily.    . nitroGLYCERIN (NITROSTAT) 0.4 MG SL tablet Place 1 tablet (0.4 mg total) under the tongue every 5 (five) minutes as needed for chest pain. 25 tablet 3  . senna-docusate (SENOKOT-S) 8.6-50 MG tablet Take 2 tablets by mouth daily as needed for mild constipation. 30 tablet 1  . simvastatin (ZOCOR) 10 MG tablet Take 10 mg by mouth daily.    Marland Kitchen triamcinolone cream (KENALOG) 0.1 % Apply 1 application topically 2 (two) times daily.     No current facility-administered medications for this visit.    Facility-Administered Medications Ordered in Other Visits  Medication Dose Route Frequency Provider Last Rate Last Dose  . Darbepoetin Alfa (ARANESP) injection 500 mcg  500 mcg Subcutaneous Weekly Cammie Sickle, MD   500 mcg at 12/27/17 1048     Allergies:   Penicillins   Social History:  The patient  reports that he quit smoking about 49 years ago. His smoking use included cigarettes. He smoked 1.00 pack per day. He has never used smokeless tobacco. He reports that he does not drink alcohol or use drugs.   Family History:   family history includes Hypertension in his father.    Review of Systems: Review of Systems  Constitutional: Negative.   Respiratory: Negative.   Cardiovascular: Positive for chest pain.  Gastrointestinal: Negative.   Musculoskeletal: Negative.        Fall risk, gait instability Bilateral shoulder pain  Psychiatric/Behavioral: Negative.   All other systems reviewed and are negative.    PHYSICAL EXAM: VS:  BP 128/69 (BP Location: Left Arm, Patient Position: Sitting, Cuff  Size: Normal)   Pulse 65   Ht 6' (1.829 m)   Wt 163 lb (73.9 kg)   BMI 22.11 kg/m  , BMI Body mass index is 22.11 kg/m. Constitutional:  oriented to person, place, and time. No distress.  HENT:  Head: Grossly normal Eyes:  no discharge. No scleral icterus.  Neck: No JVD, no carotid bruits  Cardiovascular: Regular rate and rhythm, no murmurs appreciated Pulmonary/Chest: Clear to auscultation bilaterally, no wheezes or rails Abdominal: Soft.  no distension.  no tenderness.  Musculoskeletal: Normal range of motion Neurological:  normal muscle tone. Coordination normal. No atrophy Skin: Skin warm and dry Psychiatric: normal affect, pleasant  Recent Labs: 08/17/2017: Magnesium 2.1 02/28/2018: B Natriuretic Peptide 377.0 06/13/2018: ALT 13; BUN 24; Creatinine, Ser 0.91; Hemoglobin 9.8; Platelets 104; Potassium 4.0; Sodium 137    Lipid Panel Lab Results  Component Value Date   CHOL 102 09/01/2017   HDL 35 (L) 09/01/2017   LDLCALC 56 09/01/2017   TRIG 53 09/01/2017      Wt Readings from Last 3 Encounters:  06/25/18 163 lb (73.9 kg)  05/29/18 160 lb (72.6 kg)  05/23/18 166 lb (75.3 kg)     ASSESSMENT AND PLAN:  NSTEMI (non-ST elevated myocardial infarction) ([place]) - Plan: EKG 12-Lead cardiac catheterization with severe three-vessel coronary disease Stay on aspirin and Plavix Having worsening anginal symptoms Taking more sublingual nitro daily Recommended increase isosorbide mononitrate up to 30 twice daily Call us if blood pressure runs low, for orthostasis symptoms If that happens may need to cut back on his lisinopril Again discussed various treatment options for his severe coronary disease He does not really want CABG for valve and coronary disease Would be a high risk candidate for TAVR and stenting at ostial LAD and left circumflex bifurcation lesion.  This was discussed with him in detail He did not commit one way or another but realizes he does not want bypass surgery  with general anesthesia  Acute combined systolic and diastolic heart failure ([place]) - Plan: EKG 12-Lead Euvolemic, no changes apart from what is detailed above  Severe aortic stenosis - Plan: EKG 12-Lead Low ejection fraction likely underestimating aortic valve stenosis on prior echo Aortic valve disease discussed with him in detail Would likely be a poor candidate for valve surgery  MDS (myelodysplastic syndrome), low grade ([place]) - Plan: EKG 12-Lead  followed by oncology  Pancytopenia Continues to run low Likely contributing to anginal symptoms   Total encounter time more than 45 minutes  Greater than 50% was spent in counseling and coordination of care with the patient  Disposition:   F/U  3 months   Orders Placed This Encounter  Procedures  . EKG 12-Lead     Signed, Esmond Plants, M.D., Ph.D. 06/25/2018  [place], [place]

## 2018-06-25 ENCOUNTER — Encounter: Payer: Self-pay | Admitting: Cardiovascular Disease

## 2018-06-25 ENCOUNTER — Ambulatory Visit: Payer: Medicare Other | Admitting: Cardiovascular Disease

## 2018-06-25 VITALS — BP 128/69 | HR 65 | Ht 72.0 in | Wt 163.0 lb

## 2018-06-25 DIAGNOSIS — I25118 Atherosclerotic heart disease of native coronary artery with other forms of angina pectoris: Secondary | ICD-10-CM | POA: Diagnosis not present

## 2018-06-25 DIAGNOSIS — I10 Essential (primary) hypertension: Secondary | ICD-10-CM

## 2018-06-25 DIAGNOSIS — I350 Nonrheumatic aortic (valve) stenosis: Secondary | ICD-10-CM | POA: Diagnosis not present

## 2018-06-25 DIAGNOSIS — D4620 Refractory anemia with excess of blasts, unspecified: Secondary | ICD-10-CM | POA: Diagnosis not present

## 2018-06-25 DIAGNOSIS — I5022 Chronic systolic (congestive) heart failure: Secondary | ICD-10-CM | POA: Diagnosis not present

## 2018-06-25 DIAGNOSIS — I5041 Acute combined systolic (congestive) and diastolic (congestive) heart failure: Secondary | ICD-10-CM

## 2018-06-25 DIAGNOSIS — I214 Non-ST elevation (NSTEMI) myocardial infarction: Secondary | ICD-10-CM

## 2018-06-25 MED ORDER — ISOSORBIDE MONONITRATE ER 30 MG PO TB24: 30.0000 mg | ORAL_TABLET | Freq: Two times a day (BID) | ORAL | 3 refills | Status: AC

## 2018-06-25 MED ORDER — NITROGLYCERIN 0.4 MG SL SUBL: 0.4000 mg | SUBLINGUAL_TABLET | SUBLINGUAL | 3 refills | Status: AC | PRN

## 2018-06-25 NOTE — Patient Instructions (Addendum)
Medication Instructions:   Please increase the imdur/isosorbide up to two a day (AM and PM)  If you need a refill on your cardiac medications before your next appointment, please call your pharmacy.    Lab work: No new labs needed   If you have labs (blood work) drawn today and your tests are completely normal, you will receive your results only by: Marland Kitchen MyChart Message (if you have MyChart) OR . A paper copy in the mail If you have any lab test that is abnormal or we need to change your treatment, we will call you to review the results.   Testing/Procedures: No new testing needed   Follow-Up: At [facility], you and your health needs are our priority.  As part of our continuing mission to provide you with exceptional heart care, we have created designated Provider Care Teams.  These Care Teams include your primary Cardiologist (physician) and Advanced Practice Providers (APPs -  Physician Assistants and Nurse Practitioners) who all work together to provide you with the care you need, when you need it.  . You will need a follow up appointment in 3 months .   Please call our office 2 months in advance to schedule this appointment.    . Providers on your designated Care Team:   . Murray Hodgkins, NP . Christell Faith, PA-C . Marrianne Mood, PA-C  Any Other Special Instructions Will Be Listed Below (If Applicable).  For educational health videos Log in to : [email or web address] Or : [email or web address], password : [email or web address]

## 2018-06-29 ENCOUNTER — Inpatient Hospital Stay
Admission: EM | Admit: 2018-06-29 | Discharge: 2018-07-06 | DRG: 280 | Disposition: A | Discharge status: Hospice | Payer: Medicare Other | Attending: Internal Medicine | Admitting: Internal Medicine

## 2018-06-29 ENCOUNTER — Other Ambulatory Visit: Payer: Self-pay

## 2018-06-29 ENCOUNTER — Emergency Department: Payer: Medicare Other

## 2018-06-29 ENCOUNTER — Encounter
Admission: EM | Disposition: A | Discharge status: Hospice | Payer: Self-pay | Source: Home / Self Care | Attending: Internal Medicine

## 2018-06-29 ENCOUNTER — Encounter: Payer: Self-pay | Admitting: Emergency Medicine

## 2018-06-29 DIAGNOSIS — I5023 Acute on chronic systolic (congestive) heart failure: Secondary | ICD-10-CM | POA: Diagnosis not present

## 2018-06-29 DIAGNOSIS — J189 Pneumonia, unspecified organism: Secondary | ICD-10-CM | POA: Diagnosis present

## 2018-06-29 DIAGNOSIS — I110 Hypertensive heart disease with heart failure: Secondary | ICD-10-CM | POA: Diagnosis present

## 2018-06-29 DIAGNOSIS — R630 Anorexia: Secondary | ICD-10-CM | POA: Diagnosis not present

## 2018-06-29 DIAGNOSIS — T368X5A Adverse effect of other systemic antibiotics, initial encounter: Secondary | ICD-10-CM | POA: Diagnosis not present

## 2018-06-29 DIAGNOSIS — I5084 End stage heart failure: Secondary | ICD-10-CM | POA: Diagnosis present

## 2018-06-29 DIAGNOSIS — L298 Other pruritus: Secondary | ICD-10-CM | POA: Diagnosis not present

## 2018-06-29 DIAGNOSIS — I214 Non-ST elevation (NSTEMI) myocardial infarction: Secondary | ICD-10-CM | POA: Diagnosis present

## 2018-06-29 DIAGNOSIS — I2584 Coronary atherosclerosis due to calcified coronary lesion: Secondary | ICD-10-CM | POA: Diagnosis present

## 2018-06-29 DIAGNOSIS — Y92230 Patient room in hospital as the place of occurrence of the external cause: Secondary | ICD-10-CM | POA: Diagnosis not present

## 2018-06-29 DIAGNOSIS — D696 Thrombocytopenia, unspecified: Secondary | ICD-10-CM | POA: Diagnosis present

## 2018-06-29 DIAGNOSIS — R05 Cough: Secondary | ICD-10-CM | POA: Diagnosis not present

## 2018-06-29 DIAGNOSIS — E119 Type 2 diabetes mellitus without complications: Secondary | ICD-10-CM | POA: Diagnosis present

## 2018-06-29 DIAGNOSIS — Z7189 Other specified counseling: Secondary | ICD-10-CM | POA: Diagnosis not present

## 2018-06-29 DIAGNOSIS — I2582 Chronic total occlusion of coronary artery: Secondary | ICD-10-CM | POA: Diagnosis present

## 2018-06-29 DIAGNOSIS — D469 Myelodysplastic syndrome, unspecified: Secondary | ICD-10-CM | POA: Diagnosis present

## 2018-06-29 DIAGNOSIS — Z880 Allergy status to penicillin: Secondary | ICD-10-CM

## 2018-06-29 DIAGNOSIS — E871 Hypo-osmolality and hyponatremia: Secondary | ICD-10-CM | POA: Diagnosis not present

## 2018-06-29 DIAGNOSIS — J9811 Atelectasis: Secondary | ICD-10-CM

## 2018-06-29 DIAGNOSIS — Z7902 Long term (current) use of antithrombotics/antiplatelets: Secondary | ICD-10-CM

## 2018-06-29 DIAGNOSIS — G9341 Metabolic encephalopathy: Secondary | ICD-10-CM | POA: Diagnosis not present

## 2018-06-29 DIAGNOSIS — E869 Volume depletion, unspecified: Secondary | ICD-10-CM | POA: Diagnosis present

## 2018-06-29 DIAGNOSIS — R531 Weakness: Secondary | ICD-10-CM

## 2018-06-29 DIAGNOSIS — I213 ST elevation (STEMI) myocardial infarction of unspecified site: Secondary | ICD-10-CM

## 2018-06-29 DIAGNOSIS — Z8249 Family history of ischemic heart disease and other diseases of the circulatory system: Secondary | ICD-10-CM

## 2018-06-29 DIAGNOSIS — Z87891 Personal history of nicotine dependence: Secondary | ICD-10-CM | POA: Diagnosis not present

## 2018-06-29 DIAGNOSIS — Z515 Encounter for palliative care: Secondary | ICD-10-CM | POA: Diagnosis not present

## 2018-06-29 DIAGNOSIS — I2510 Atherosclerotic heart disease of native coronary artery without angina pectoris: Secondary | ICD-10-CM | POA: Diagnosis not present

## 2018-06-29 DIAGNOSIS — J9600 Acute respiratory failure, unspecified whether with hypoxia or hypercapnia: Secondary | ICD-10-CM | POA: Diagnosis not present

## 2018-06-29 DIAGNOSIS — I340 Nonrheumatic mitral (valve) insufficiency: Secondary | ICD-10-CM | POA: Diagnosis not present

## 2018-06-29 DIAGNOSIS — I959 Hypotension, unspecified: Secondary | ICD-10-CM | POA: Diagnosis not present

## 2018-06-29 DIAGNOSIS — D638 Anemia in other chronic diseases classified elsewhere: Secondary | ICD-10-CM | POA: Diagnosis present

## 2018-06-29 DIAGNOSIS — I252 Old myocardial infarction: Secondary | ICD-10-CM | POA: Diagnosis not present

## 2018-06-29 DIAGNOSIS — D709 Neutropenia, unspecified: Secondary | ICD-10-CM | POA: Diagnosis not present

## 2018-06-29 DIAGNOSIS — Z7982 Long term (current) use of aspirin: Secondary | ICD-10-CM

## 2018-06-29 DIAGNOSIS — I25110 Atherosclerotic heart disease of native coronary artery with unstable angina pectoris: Secondary | ICD-10-CM | POA: Diagnosis present

## 2018-06-29 DIAGNOSIS — I200 Unstable angina: Secondary | ICD-10-CM | POA: Diagnosis not present

## 2018-06-29 DIAGNOSIS — I255 Ischemic cardiomyopathy: Secondary | ICD-10-CM | POA: Diagnosis present

## 2018-06-29 DIAGNOSIS — R0602 Shortness of breath: Secondary | ICD-10-CM | POA: Diagnosis not present

## 2018-06-29 DIAGNOSIS — Z66 Do not resuscitate: Secondary | ICD-10-CM | POA: Diagnosis not present

## 2018-06-29 DIAGNOSIS — K5900 Constipation, unspecified: Secondary | ICD-10-CM | POA: Diagnosis present

## 2018-06-29 DIAGNOSIS — I10 Essential (primary) hypertension: Secondary | ICD-10-CM | POA: Diagnosis not present

## 2018-06-29 DIAGNOSIS — I350 Nonrheumatic aortic (valve) stenosis: Secondary | ICD-10-CM | POA: Diagnosis present

## 2018-06-29 HISTORY — PX: LEFT HEART CATH AND CORONARY ANGIOGRAPHY: CATH118249

## 2018-06-29 HISTORY — PX: CORONARY/GRAFT ACUTE MI REVASCULARIZATION: CATH118305

## 2018-06-29 LAB — CBC
HEMATOCRIT: 31.9 % — AB (ref 39.0–52.0)
Hemoglobin: 10 g/dL — ABNORMAL LOW (ref 13.0–17.0)
MCH: 36.2 pg — ABNORMAL HIGH (ref 26.0–34.0)
MCHC: 31.3 g/dL (ref 30.0–36.0)
MCV: 115.6 fL — AB (ref 80.0–100.0)
NRBC: 0 % (ref 0.0–0.2)
Platelets: 82 10*3/uL — ABNORMAL LOW (ref 150–400)
RBC: 2.76 MIL/uL — AB (ref 4.22–5.81)
RDW: 15.5 % (ref 11.5–15.5)
WBC: 3 10*3/uL — AB (ref 4.0–10.5)

## 2018-06-29 LAB — GLUCOSE, CAPILLARY
GLUCOSE-CAPILLARY: 128 mg/dL — AB (ref 70–99)
GLUCOSE-CAPILLARY: 91 mg/dL (ref 70–99)
Glucose-Capillary: 213 mg/dL — ABNORMAL HIGH (ref 70–99)
Glucose-Capillary: 97 mg/dL (ref 70–99)

## 2018-06-29 LAB — BASIC METABOLIC PANEL
ANION GAP: 9 (ref 5–15)
BUN: 23 mg/dL (ref 8–23)
CALCIUM: 9.7 mg/dL (ref 8.9–10.3)
CHLORIDE: 110 mmol/L (ref 98–111)
CO2: 22 mmol/L (ref 22–32)
Creatinine, Ser: 0.99 mg/dL (ref 0.61–1.24)
GFR calc Af Amer: >60 mL/min (ref >60)
GFR calc non Af Amer: >60 mL/min (ref >60)
Glucose, Bld: 164 mg/dL — ABNORMAL HIGH (ref 70–99)
Potassium: 4.6 mmol/L (ref 3.5–5.1)
SODIUM: 141 mmol/L (ref 135–145)

## 2018-06-29 LAB — APTT

## 2018-06-29 LAB — TROPONIN I: Troponin I: 0.08 ng/mL (ref <0.03)

## 2018-06-29 LAB — MRSA PCR SCREENING: MRSA BY PCR: POSITIVE — AB

## 2018-06-29 LAB — PROTIME-INR
INR: 1.47
PROTHROMBIN TIME: 17.7 s — AB (ref 11.4–15.2)

## 2018-06-29 SURGERY — CORONARY/GRAFT ACUTE MI REVASCULARIZATION
Anesthesia: Moderate Sedation

## 2018-06-29 MED ORDER — FENTANYL CITRATE (PF) 100 MCG/2ML IJ SOLN: 50.0000 ug | Freq: Once | INTRAMUSCULAR | Status: DC

## 2018-06-29 MED ORDER — FENTANYL CITRATE (PF) 100 MCG/2ML IJ SOLN
25.0000 ug | Freq: Once | INTRAMUSCULAR | Status: AC
  Administered 2018-06-29: 25 ug via INTRAVENOUS

## 2018-06-29 MED ORDER — CARVEDILOL 3.125 MG PO TABS
3.1250 mg | ORAL_TABLET | Freq: Two times a day (BID) | ORAL | Status: DC
  Administered 2018-06-29 – 2018-07-06 (×11): 3.125 mg via ORAL
  Filled 2018-06-29 (×13): qty 1

## 2018-06-29 MED ORDER — HEPARIN (PORCINE) IN NACL 2000-0.9 UNIT/L-% IV SOLN
INTRAVENOUS | Status: DC | PRN
  Administered 2018-06-29: 1000 mL

## 2018-06-29 MED ORDER — MIDAZOLAM HCL 2 MG/2ML IJ SOLN
INTRAMUSCULAR | Status: AC
  Filled 2018-06-29: qty 2

## 2018-06-29 MED ORDER — MIDAZOLAM HCL 2 MG/2ML IJ SOLN
INTRAMUSCULAR | Status: DC | PRN
  Administered 2018-06-29: 1 mg via INTRAVENOUS

## 2018-06-29 MED ORDER — NITROGLYCERIN 0.4 MG SL SUBL
0.4000 mg | SUBLINGUAL_TABLET | SUBLINGUAL | Status: DC | PRN
  Administered 2018-06-30 – 2018-07-04 (×8): 0.4 mg via SUBLINGUAL
  Filled 2018-06-29 (×9): qty 1

## 2018-06-29 MED ORDER — ALBUTEROL SULFATE (2.5 MG/3ML) 0.083% IN NEBU
2.5000 mg | INHALATION_SOLUTION | Freq: Four times a day (QID) | RESPIRATORY_TRACT | Status: DC | PRN
  Administered 2018-07-02: 2.5 mg via RESPIRATORY_TRACT
  Filled 2018-06-29: qty 3

## 2018-06-29 MED ORDER — HEPARIN (PORCINE) IN NACL 1000-0.9 UT/500ML-% IV SOLN
INTRAVENOUS | Status: AC
  Filled 2018-06-29: qty 1000

## 2018-06-29 MED ORDER — INSULIN ASPART 100 UNIT/ML ~~LOC~~ SOLN
0.0000 [IU] | Freq: Three times a day (TID) | SUBCUTANEOUS | Status: DC
  Administered 2018-06-29: 3 [IU] via SUBCUTANEOUS
  Administered 2018-06-30: 2 [IU] via SUBCUTANEOUS
  Administered 2018-06-30 – 2018-07-01 (×2): 1 [IU] via SUBCUTANEOUS
  Administered 2018-07-01 – 2018-07-02 (×2): 2 [IU] via SUBCUTANEOUS
  Administered 2018-07-02 – 2018-07-04 (×3): 1 [IU] via SUBCUTANEOUS
  Administered 2018-07-04: 2 [IU] via SUBCUTANEOUS
  Administered 2018-07-05 – 2018-07-06 (×2): 1 [IU] via SUBCUTANEOUS
  Filled 2018-06-29 (×11): qty 1

## 2018-06-29 MED ORDER — SENNOSIDES-DOCUSATE SODIUM 8.6-50 MG PO TABS
2.0000 | ORAL_TABLET | Freq: Every day | ORAL | Status: DC | PRN
  Administered 2018-06-30 – 2018-07-01 (×2): 2 via ORAL
  Filled 2018-06-29 (×2): qty 2

## 2018-06-29 MED ORDER — ONDANSETRON HCL 4 MG/2ML IJ SOLN
4.0000 mg | Freq: Four times a day (QID) | INTRAMUSCULAR | Status: DC | PRN
  Administered 2018-07-03 – 2018-07-04 (×4): 4 mg via INTRAVENOUS
  Filled 2018-06-29 (×4): qty 2

## 2018-06-29 MED ORDER — HEPARIN BOLUS VIA INFUSION
4000.0000 [IU] | Freq: Once | INTRAVENOUS | Status: AC
  Administered 2018-06-29: 4000 [IU] via INTRAVENOUS
  Filled 2018-06-29: qty 4000

## 2018-06-29 MED ORDER — HEPARIN SODIUM (PORCINE) 5000 UNIT/ML IJ SOLN: 4000.0000 [IU] | Freq: Once | INTRAMUSCULAR | Status: DC

## 2018-06-29 MED ORDER — ALBUTEROL SULFATE HFA 108 (90 BASE) MCG/ACT IN AERS: 2.0000 | INHALATION_SPRAY | Freq: Four times a day (QID) | RESPIRATORY_TRACT | Status: DC | PRN

## 2018-06-29 MED ORDER — ADULT MULTIVITAMIN W/MINERALS CH
1.0000 | ORAL_TABLET | Freq: Every day | ORAL | Status: DC
  Administered 2018-06-29 – 2018-07-06 (×8): 1 via ORAL
  Filled 2018-06-29 (×8): qty 1

## 2018-06-29 MED ORDER — IOPAMIDOL (ISOVUE-300) INJECTION 61%
INTRAVENOUS | Status: DC | PRN
  Administered 2018-06-29: 90 mL via INTRA_ARTERIAL

## 2018-06-29 MED ORDER — MORPHINE SULFATE (PF) 2 MG/ML IV SOLN
1.0000 mg | INTRAVENOUS | Status: DC | PRN
  Administered 2018-06-29: 1 mg via INTRAVENOUS
  Administered 2018-06-30 – 2018-07-03 (×6): 2 mg via INTRAVENOUS
  Filled 2018-06-29 (×7): qty 1

## 2018-06-29 MED ORDER — FENTANYL CITRATE (PF) 100 MCG/2ML IJ SOLN
INTRAMUSCULAR | Status: AC
  Filled 2018-06-29: qty 2

## 2018-06-29 MED ORDER — SODIUM CHLORIDE 0.9 % IV SOLN: 250.0000 mL | INTRAVENOUS | Status: DC | PRN

## 2018-06-29 MED ORDER — DARBEPOETIN ALFA 500 MCG/ML IJ SOSY: 500.0000 ug | PREFILLED_SYRINGE | INTRAMUSCULAR | Status: DC

## 2018-06-29 MED ORDER — CLOPIDOGREL BISULFATE 75 MG PO TABS
75.0000 mg | ORAL_TABLET | Freq: Every day | ORAL | Status: DC
  Administered 2018-06-29 – 2018-07-06 (×8): 75 mg via ORAL
  Filled 2018-06-29 (×8): qty 1

## 2018-06-29 MED ORDER — ISOSORBIDE MONONITRATE ER 30 MG PO TB24
30.0000 mg | ORAL_TABLET | Freq: Two times a day (BID) | ORAL | Status: DC
  Administered 2018-06-29 – 2018-07-01 (×5): 30 mg via ORAL
  Filled 2018-06-29 (×5): qty 1

## 2018-06-29 MED ORDER — SODIUM CHLORIDE 0.9% FLUSH: 3.0000 mL | INTRAVENOUS | Status: DC | PRN

## 2018-06-29 MED ORDER — HEPARIN BOLUS VIA INFUSION: 3500.0000 [IU] | Freq: Once | INTRAVENOUS | Status: DC

## 2018-06-29 MED ORDER — CHLORHEXIDINE GLUCONATE CLOTH 2 % EX PADS
6.0000 | MEDICATED_PAD | Freq: Every day | CUTANEOUS | Status: AC
  Administered 2018-06-29 – 2018-07-02 (×4): 6 via TOPICAL

## 2018-06-29 MED ORDER — HEPARIN (PORCINE) 25000 UT/250ML-% IV SOLN
1500.0000 [IU]/h | INTRAVENOUS | Status: DC
  Administered 2018-06-29: 850 [IU]/h via INTRAVENOUS
  Administered 2018-06-30 – 2018-07-01 (×2): 1300 [IU]/h via INTRAVENOUS
  Administered 2018-07-02: 1500 [IU]/h via INTRAVENOUS
  Filled 2018-06-29 (×4): qty 250

## 2018-06-29 MED ORDER — SODIUM CHLORIDE 0.9% FLUSH
3.0000 mL | Freq: Two times a day (BID) | INTRAVENOUS | Status: DC
  Administered 2018-06-29 – 2018-07-06 (×13): 3 mL via INTRAVENOUS

## 2018-06-29 MED ORDER — NITROGLYCERIN IN D5W 200-5 MCG/ML-% IV SOLN: 0.0000 ug/min | INTRAVENOUS | Status: DC

## 2018-06-29 MED ORDER — NITROGLYCERIN IN D5W 200-5 MCG/ML-% IV SOLN
INTRAVENOUS | Status: AC
  Filled 2018-06-29: qty 250

## 2018-06-29 MED ORDER — SIMVASTATIN 20 MG PO TABS
10.0000 mg | ORAL_TABLET | Freq: Every day | ORAL | Status: DC
  Administered 2018-06-29 – 2018-07-05 (×5): 10 mg via ORAL
  Filled 2018-06-29 (×6): qty 1

## 2018-06-29 MED ORDER — SODIUM CHLORIDE 0.9 % IV BOLUS: 500.0000 mL | Freq: Once | INTRAVENOUS | Status: DC

## 2018-06-29 MED ORDER — VERAPAMIL HCL 2.5 MG/ML IV SOLN
INTRAVENOUS | Status: AC
  Filled 2018-06-29: qty 2

## 2018-06-29 MED ORDER — LISINOPRIL 5 MG PO TABS
10.0000 mg | ORAL_TABLET | Freq: Every day | ORAL | Status: DC
  Administered 2018-06-30: 10 mg via ORAL
  Filled 2018-06-29 (×2): qty 2

## 2018-06-29 MED ORDER — DOCUSATE SODIUM 100 MG PO CAPS
100.0000 mg | ORAL_CAPSULE | Freq: Every day | ORAL | Status: DC | PRN
  Administered 2018-06-30: 100 mg via ORAL
  Filled 2018-06-29: qty 1

## 2018-06-29 MED ORDER — HEPARIN BOLUS VIA INFUSION
2200.0000 [IU] | Freq: Once | INTRAVENOUS | Status: AC
  Administered 2018-06-29: 2200 [IU] via INTRAVENOUS
  Filled 2018-06-29: qty 2200

## 2018-06-29 MED ORDER — SODIUM CHLORIDE 0.9 % IV SOLN
INTRAVENOUS | Status: AC | PRN
  Administered 2018-06-29: 72 mL via INTRAVENOUS

## 2018-06-29 MED ORDER — HEPARIN (PORCINE) 25000 UT/250ML-% IV SOLN
850.0000 [IU]/h | INTRAVENOUS | Status: DC
  Filled 2018-06-29: qty 250

## 2018-06-29 MED ORDER — FLUTICASONE PROPIONATE 50 MCG/ACT NA SUSP
2.0000 | Freq: Every day | NASAL | Status: DC
  Administered 2018-06-29 – 2018-07-06 (×8): 2 via NASAL
  Filled 2018-06-29 (×2): qty 16

## 2018-06-29 MED ORDER — ACETAMINOPHEN 325 MG PO TABS
650.0000 mg | ORAL_TABLET | ORAL | Status: DC | PRN
  Administered 2018-06-29 – 2018-07-03 (×4): 650 mg via ORAL
  Filled 2018-06-29 (×4): qty 2

## 2018-06-29 MED ORDER — BISACODYL 5 MG PO TBEC
5.0000 mg | DELAYED_RELEASE_TABLET | Freq: Every day | ORAL | Status: DC | PRN
  Administered 2018-06-30 – 2018-07-06 (×5): 5 mg via ORAL
  Filled 2018-06-29 (×5): qty 1

## 2018-06-29 MED ORDER — HEPARIN SODIUM (PORCINE) 1000 UNIT/ML IJ SOLN
INTRAMUSCULAR | Status: AC
  Filled 2018-06-29: qty 1

## 2018-06-29 MED ORDER — ASPIRIN EC 81 MG PO TBEC
81.0000 mg | DELAYED_RELEASE_TABLET | Freq: Every day | ORAL | Status: DC
  Administered 2018-06-29 – 2018-07-06 (×8): 81 mg via ORAL
  Filled 2018-06-29 (×8): qty 1

## 2018-06-29 MED ORDER — MUPIROCIN 2 % EX OINT
1.0000 "application " | TOPICAL_OINTMENT | Freq: Two times a day (BID) | CUTANEOUS | Status: AC
  Administered 2018-06-29 – 2018-07-03 (×10): 1 via NASAL
  Filled 2018-06-29: qty 22

## 2018-06-29 SURGICAL SUPPLY — 11 items
CATH INFINITI 5FR JL4 (CATHETERS) ×3 IMPLANT
CATH INFINITI JR4 5F (CATHETERS) ×3 IMPLANT
DEVICE CLOSURE MYNXGRIP 6/7F (Vascular Products) ×3 IMPLANT
DEVICE INFLAT 30 PLUS (MISCELLANEOUS) IMPLANT
GLIDESHEATH SLEND SS 6F .021 (SHEATH) ×3 IMPLANT
KIT MANI 3VAL PERCEP (MISCELLANEOUS) ×3 IMPLANT
NEEDLE PERC 18GX7CM (NEEDLE) ×3 IMPLANT
PACK CARDIAC CATH (CUSTOM PROCEDURE TRAY) ×3 IMPLANT
SHEATH AVANTI 6FR X 11CM (SHEATH) ×3 IMPLANT
WIRE GUIDERIGHT .035X150 (WIRE) ×3 IMPLANT
WIRE ROSEN-J .035X260CM (WIRE) ×3 IMPLANT

## 2018-06-29 NOTE — Progress Notes (Addendum)
[place] for heparin Indication: chest pain/ACS  Allergies  Allergen Reactions  . Penicillins Swelling    Has patient had a PCN reaction causing immediate rash, facial/tongue/throat swelling, SOB or lightheadedness with hypotension: Yes Has patient had a PCN reaction causing severe rash involving mucus membranes or skin necrosis: No Has patient had a PCN reaction that required hospitalization: No Has patient had a PCN reaction occurring within the last 10 years: No If all of the above answers are "NO", then may proceed with Cephalosporin use.    Patient Measurements: Height: 6' (182.9 cm) Weight: 164 lb 7.4 oz (74.6 kg) IBW/kg (Calculated) : 77.6 Heparin Dosing Weight: 74.6 kg  Vital Signs: Temp: 97.8 F (36.6 C) (11/17 0833) Temp Source: Oral (11/17 0833) BP: 128/80 (11/17 0900) Pulse Rate: 84 (11/17 0900)  Labs: Recent Labs    06/29/18 0630 06/29/18 0704  HGB 10.0*  --   HCT 31.9*  --   PLT 82*  --   APTT  --  >160*  LABPROT  --  17.7*  INR  --  1.47  CREATININE 0.99  --   TROPONINI 0.08*  --     Estimated Creatinine Clearance: 51.3 mL/min (by C-G formula based on SCr of 0.99 mg/dL).   Medical History: Past Medical History:  Diagnosis Date  . Aortic stenosis   . CAD (coronary artery disease)    a. NSTEMI 1/19 - LHC 1/19: ostial LAD 75%, proximal LAD 80%, ostial LCx 75%, mid LCX 75%, OM2 80%, ostial RCA 99%, mid RCA 95%, distal RCA 99%, med Rx  . Diabetes mellitus without complication ([place])   . Hypertension   . Ischemic cardiomyopathy    a. TTE 1/19: EF of 30-35%, hypokinesis of the anterior, anteroseptal, and apical myocardium, Gr1DD, moderate aortic stenosis (may be under estimated due to his cardiomyopathy), RV systolic function normal, left atrium normal size, unable to estimate PASP  . Myelodysplastic syndrome [facility])     Assessment: 84 year old male admitted with chest pain. Patient s/p cardiac catheterization.  Recommendation from Cardiology to use heparin for 48 hours.  Goal of Therapy:  Heparin level 0.3-0.7 units/ml Monitor platelets by anticoagulation protocol: Yes   Plan:  Per consult from Cardiology, heparin to start 8 hours post-sheath removal. Sheath removed at 0800 per procedure log. Patient's last dose of heparin at approximately the same time. Will order heparin to start today at 1600.  Will dose bolus more cautiously. Will give heparin 2200 unit bolus followed by heparin drip at 850 units/hr. Heparin level ordered for 11/18 at 0000.  CBC with morning labs.  Tawnya Crook, PharmD Pharmacy Resident  06/29/2018 10:44 AM

## 2018-06-29 NOTE — ED Triage Notes (Addendum)
Pt arrived via EMS from home where pt was woke with left sided chest pain that radiates up into neck. Pt reports SOB but denies nausea. Pt gave 3 nitro tablets at home, without relief. Pt received 1inch nitro paste and asprin in route. Pts current chest pain at 5/10 with no change after medications.

## 2018-06-29 NOTE — Progress Notes (Signed)
   06/29/18 0700  Clinical Encounter Type  Visited With Family;Patient and family together  Visit Type Initial;Follow-up  Referral From Nurse  Spiritual Encounters  Spiritual Needs Prayer;Emotional;Grief support;Other (Comment)  Stress Factors  Family Stress Factors Exhausted;Loss;Loss of control;Other (Comment)

## 2018-06-29 NOTE — Plan of Care (Signed)
Patient and his family oriented to ICU, pt denies chest pain, resting quietly w/ 3 liters [place], HOB flat until 1010.  Both access sites level 0.  Family at bedside

## 2018-06-29 NOTE — Consult Note (Signed)
ANTICOAGULATION CONSULT NOTE - Follow Up Consult  Pharmacy Consult for Heparin Drip  Indication: chest pain/ACS  Patient Measurements: Weight: 160 lb (72.6 kg)  Vital Signs: Temp: 98 F (36.7 C) (11/17 0629) Temp Source: Oral (11/17 0629) BP: 145/95 (11/17 0630) Pulse Rate: 96 (11/17 0630)  Labs: No results for input(s): HGB, HCT, PLT, APTT, LABPROT, INR, HEPARINUNFRC, HEPRLOWMOCWT, CREATININE, CKTOTAL, CKMB, TROPONINI in the last 72 hours.  Estimated Creatinine Clearance: 54.3 mL/min (by C-G formula based on SCr of 0.91 mg/dL).   Assessment: Pharmacy consulted for heparin dosing and monitoring in 84 yo male admitted with ACS/STEMI.   Goal of Therapy:  Heparin level 0.3-0.7 units/ml Monitor platelets by anticoagulation protocol: Yes   Plan:  Baseline labs ordered Give 4000 units bolus x 1 Start heparin infusion at 850 units/hr Check anti-Xa level in 8 hours and daily while on heparin Continue to monitor H&H and platelets  Pernell Dupre, PharmD, BCPS Clinical Pharmacist 06/29/2018 6:54 AM

## 2018-06-29 NOTE — ED Provider Notes (Signed)
[facility] Emergency Department Provider Note  ____________________________________________   First MD Initiated Contact with Patient 06/29/18 [phone]     (approximate)  I have reviewed the triage vital signs and the nursing notes.   HISTORY  Chief Complaint Chest Pain   HPI Terry Gonzales is a 84 y.o. male who comes to the emergency department via EMS with chest pain.  His pain is crushing and aching substernal radiating towards his left neck that began acutely at 4 AM.  He has a complex past medical history including diabetes mellitus, hypertension, along with known triple-vessel disease.  His symptoms began acutely this morning and have been constant ever since.  They are associated with some nausea and shortness of breath.  He took 3 tabs of nitroglycerin at home which  did not seem to help much.  EMS applied 1 inch of Nitropaste and gave him 324 mg of aspirin in route.  The pain was not ripping or tearing and did not go straight to his back.  He has no history of DVT or pulmonary embolism.  His cardiologist is Dr. Rockey Situ.  He takes both aspirin and Plavix daily and has no stents.  Currently nothing seems to make the pain better or worse.   Past Medical History:  Diagnosis Date  . Aortic stenosis   . CAD (coronary artery disease)    a. NSTEMI 1/19 - LHC 1/19: ostial LAD 75%, proximal LAD 80%, ostial LCx 75%, mid LCX 75%, OM2 80%, ostial RCA 99%, mid RCA 95%, distal RCA 99%, med Rx  . Diabetes mellitus without complication ([place])   . Hypertension   . Ischemic cardiomyopathy    a. TTE 1/19: EF of 30-35%, hypokinesis of the anterior, anteroseptal, and apical myocardium, Gr1DD, moderate aortic stenosis (may be under estimated due to his cardiomyopathy), RV systolic function normal, left atrium normal size, unable to estimate PASP  . Myelodysplastic syndrome [facility])     Patient Active Problem List   Diagnosis Date Noted  . CAD (coronary artery disease), native  coronary artery 10/21/2017  . Chest pain 09/01/2017  . MDS (myelodysplastic syndrome), low grade ([place]) 08/20/2017  . NSTEMI (non-ST elevated myocardial infarction) ([place])   . Acute combined systolic and diastolic heart failure ([place])   . Severe aortic stenosis   . Pneumonia 08/14/2017    Past Surgical History:  Procedure Laterality Date  . LEFT HEART CATH AND CORONARY ANGIOGRAPHY N/A 08/16/2017   Procedure: LEFT HEART CATH AND CORONARY ANGIOGRAPHY;  Surgeon: Minna Merritts, MD;  Location: [place] CV LAB;  Service: Cardiovascular;  Laterality: N/A;    Prior to Admission medications   Medication Sig Start Date End Date Taking? Authorizing Provider  aspirin EC 81 MG EC tablet Take 1 tablet (81 mg total) by mouth daily. 08/20/17  Yes Vaughan Basta, MD  clopidogrel (PLAVIX) 75 MG tablet Take 1 tablet (75 mg total) by mouth daily. 09/05/17  Yes Gollan, Kathlene November, MD  albuterol (PROVENTIL HFA;VENTOLIN HFA) 108 (90 Base) MCG/ACT inhaler Inhale 2 puffs into the lungs every 6 (six) hours as needed for wheezing or shortness of breath. 08/19/17   Vaughan Basta, MD  bisacodyl (DULCOLAX) 5 MG EC tablet Take 1 tablet (5 mg total) by mouth daily as needed for moderate constipation. 08/19/17   Vaughan Basta, MD  carvedilol (COREG) 3.125 MG tablet Take 1 tablet (3.125 mg total) by mouth 2 (two) times daily. 09/23/17 02/28/26  Rise Mu, PA-C  Darbepoetin Alfa (ARANESP) 500 MCG/ML SOSY  injection Inject 500 mcg into the skin every 30 (thirty) days.    [provider]  docusate sodium (COLACE) 100 MG capsule Take 1 capsule (100 mg total) by mouth daily as needed. 09/04/17 09/04/18  Earleen Newport, MD  fluticasone (FLONASE) 50 MCG/ACT nasal spray Place 2 sprays into the nose daily. 02/20/16 02/28/26  [provider]  isosorbide mononitrate (IMDUR) 30 MG 24 hr tablet Take 1 tablet (30 mg total) by mouth 2 (two) times daily. 06/25/18   Minna Merritts, MD  lisinopril  (PRINIVIL,ZESTRIL) 10 MG tablet Take 1 tablet (10 mg total) by mouth daily. 01/20/18   Minna Merritts, MD  metoprolol succinate (TOPROL-XL) 25 MG 24 hr tablet Take 12.5 mg by mouth daily. 09/05/17   [provider]  Multiple Vitamin (MULTI-VITAMINS) TABS Take 1 tablet by mouth daily. 02/24/03   [provider]  nitroGLYCERIN (NITROSTAT) 0.4 MG SL tablet Place 1 tablet (0.4 mg total) under the tongue every 5 (five) minutes as needed for chest pain. 06/25/18   Minna Merritts, MD  senna-docusate (SENOKOT-S) 8.6-50 MG tablet Take 2 tablets by mouth daily as needed for mild constipation. 09/04/17 09/04/18  Earleen Newport, MD  simvastatin (ZOCOR) 10 MG tablet Take 10 mg by mouth daily. 05/06/17   [provider]  triamcinolone cream (KENALOG) 0.1 % Apply 1 application topically 2 (two) times daily.    [provider]    Allergies Penicillins  Family History  Problem Relation Age of Onset  . Hypertension Father     Social History Social History   Tobacco Use  . Smoking status: Former Smoker    Packs/day: 1.00    Types: Cigarettes    Last attempt to quit: 08/13/1968    Years since quitting: 49.9  . Smokeless tobacco: Never Used  Substance Use Topics  . Alcohol use: No    Frequency: Never  . Drug use: No    Review of Systems Constitutional: No fever/chills Eyes: No visual changes. ENT: No sore throat. Cardiovascular: Positive for chest pain. Respiratory: Positive for shortness of breath. Gastrointestinal: No abdominal pain.  Positive for nausea, no vomiting.  No diarrhea.  No constipation. Genitourinary: Negative for dysuria. Musculoskeletal: Negative for back pain. Skin: Negative for rash. Neurological: Negative for headaches, focal weakness or numbness.   ____________________________________________   PHYSICAL EXAM:  VITAL SIGNS: ED Triage Vitals  Enc Vitals Group     BP 06/29/18 0627 (!) 140/97     Pulse Rate 06/29/18 0627 98      Resp 06/29/18 0629 18     Temp 06/29/18 0629 98 F (36.7 C)     Temp Source 06/29/18 0629 Oral     SpO2 06/29/18 0627 95 %     Weight 06/29/18 0642 160 lb (72.6 kg)     Height --      Head Circumference --      Peak Flow --      Pain Score --      Pain Loc --      Pain Edu? --      Excl. in [place]? --     Constitutional: Alert and oriented x4 appears quite uncomfortable appearing although nontoxic no diaphoresis Eyes: PERRL EOMI. Head: Atraumatic. Nose: No congestion/rhinnorhea. Mouth/Throat: No trismus Neck: No stridor.  Able to lie completely flat  Cardiovascular: Normal rate, regular rhythm.  2 out of 6 systolic murmur best heard at the right sternal border Respiratory: Normal respiratory effort.  No retractions. Lungs  CTAB and moving good air Gastrointestinal: Soft nontender Musculoskeletal: Legs are equal in size with trace edema bilaterally Neurologic:  Normal speech and language. No gross focal neurologic deficits are appreciated. Skin:  Skin is warm, dry and intact. No rash noted. Psychiatric: Mood and affect are normal. Speech and behavior are normal.    ____________________________________________   DIFFERENTIAL includes but not limited to  STEMI, non-STEMI, aortic dissection, pulmonary embolism ____________________________________________   LABS (all labs ordered are listed, but only abnormal results are displayed)  Labs Reviewed  BASIC METABOLIC PANEL - Abnormal; Notable for the following components:      Result Value   Glucose, Bld 164 (*)    All other components within normal limits  CBC - Abnormal; Notable for the following components:   WBC 3.0 (*)    RBC 2.76 (*)    Hemoglobin 10.0 (*)    HCT 31.9 (*)    MCV 115.6 (*)    MCH 36.2 (*)    Platelets 82 (*)    All other components within normal limits  TROPONIN I - Abnormal; Notable for the following components:   Troponin I 0.08 (*)    All other components within normal limits  APTT  PROTIME-INR      Lab work reviewed by me with elevated troponin concerning for acute myocardial ischemia __________________________________________  EKG  ED ECG REPORT I, Darel Hong, the attending physician, personally viewed and interpreted this ECG.  Date: 06/29/2018 EKG Time:  Rate: 70 Rhythm: normal sinus rhythm Intervals: normal ST/T Wave abnormalities: ST elevation in leads V1 and V2 with diffuse reciprocal depression consistent with STEMI Narrative Interpretation: STEMI  ____________________________________________  RADIOLOGY  Chest x-ray reviewed by me consistent with pulmonary edema ____________________________________________   PROCEDURES  Procedure(s) performed: no  .Critical Care Performed by: Darel Hong, MD Authorized by: Darel Hong, MD   Critical care provider statement:    Critical care time (minutes):  30   Critical care time was exclusive of:  Separately billable procedures and treating other patients   Critical care was necessary to treat or prevent imminent or life-threatening deterioration of the following conditions:  Cardiac failure   Critical care was time spent personally by me on the following activities:  Development of treatment plan with patient or surrogate, discussions with consultants, evaluation of patient's response to treatment, examination of patient, obtaining history from patient or surrogate, ordering and performing treatments and interventions, ordering and review of laboratory studies, ordering and review of radiographic studies, pulse oximetry, re-evaluation of patient's condition and review of old charts    Critical Care performed: Yes  ____________________________________________   INITIAL IMPRESSION / ASSESSMENT AND PLAN / ED COURSE  Pertinent labs & imaging results that were available during my care of the patient were reviewed by me and considered in my medical decision making (see chart for details).   As part of my  medical decision making, I reviewed the following data within the [place] History obtained from family if available, nursing notes, old chart and ekg, as well as notes from prior ED visits.  The patient comes the emergency department uncomfortable appearing with a good story and EKG G concerning for STEMI.  He has already gotten 324 mg of aspirin as well bolus and heparin now.  I spoke with Dr. Fletcher Anon on-call for STEMI activation who will kindly come evaluate the patient.  In the meantime the patient has persistent pains we will give him 50 mcg of fentanyl and place  him on a low-dose nitroglycerin infusion.  Following nitroglycerin infusion the patient became more hypotensive and uncomfortable appearing so we immediately stopped and bolused a small amount of fluid.  This is likely secondary to his aortic stenosis as the patient is preload dependent.  Will hold off on any further nitroglycerin now.  Dr. Fletcher Anon came to bedside and agrees with emergent cardiac catheterization now.  I have answered the patient and his family's questions and they verbalize understanding and agreement with the plan.      ____________________________________________   FINAL CLINICAL IMPRESSION(S) / ED DIAGNOSES  Final diagnoses:  ST elevation myocardial infarction (STEMI), unspecified artery ([place])      NEW MEDICATIONS STARTED DURING THIS VISIT:  Current Discharge Medication List       Note:  This document was prepared using Dragon voice recognition software and may include unintentional dictation errors.     Darel Hong, MD 06/29/18 [phone]

## 2018-06-29 NOTE — H&P (Signed)
[place] [facility] [place] NAME: Terry Gonzales    MR#:  [id number]  DATE OF BIRTH:  04/21/1925  DATE OF ADMISSION:  06/29/2018  PRIMARY CARE PHYSICIAN: Zolotor, Audie Clear, MD   REQUESTING/REFERRING PHYSICIAN:  Dr Fletcher Anon  CHIEF COMPLAINT:   Chest pain HISTORY OF PRESENT ILLNESS:  Terry Gonzales  is a 84 y.o. male with a known history of severe three-vessel coronary artery disease who presented to the emergency room due to chest pain and was sent to cardiac catheterization lab for evaluation of the possibility of ST elevation MI.   PAST MEDICAL HISTORY:   Past Medical History:  Diagnosis Date  . Aortic stenosis   . CAD (coronary artery disease)    a. NSTEMI 1/19 - LHC 1/19: ostial LAD 75%, proximal LAD 80%, ostial LCx 75%, mid LCX 75%, OM2 80%, ostial RCA 99%, mid RCA 95%, distal RCA 99%, med Rx  . Diabetes mellitus without complication ([place])   . Hypertension   . Ischemic cardiomyopathy    a. TTE 1/19: EF of 30-35%, hypokinesis of the anterior, anteroseptal, and apical myocardium, Gr1DD, moderate aortic stenosis (may be under estimated due to his cardiomyopathy), RV systolic function normal, left atrium normal size, unable to estimate PASP  . Myelodysplastic syndrome ([place])     PAST SURGICAL HISTORY:   Past Surgical History:  Procedure Laterality Date  . LEFT HEART CATH AND CORONARY ANGIOGRAPHY N/A 08/16/2017   Procedure: LEFT HEART CATH AND CORONARY ANGIOGRAPHY;  Surgeon: Minna Merritts, MD;  Location: [place] [facility];  Service: Cardiovascular;  Laterality: N/A;    SOCIAL HISTORY:   Social History   Tobacco Use  . Smoking status: Former Smoker    Packs/day: 1.00    Types: Cigarettes    Last attempt to quit: 08/13/1968    Years since quitting: 49.9  . Smokeless tobacco: Never Used  Substance Use Topics  . Alcohol use: No    Frequency: Never    FAMILY HISTORY:   Family History  Problem Relation Age of Onset  . Hypertension  Father     DRUG ALLERGIES:   Allergies  Allergen Reactions  . Penicillins Swelling    Has patient had a PCN reaction causing immediate rash, facial/tongue/throat swelling, SOB or lightheadedness with hypotension: Yes Has patient had a PCN reaction causing severe rash involving mucus membranes or skin necrosis: No Has patient had a PCN reaction that required hospitalization: No Has patient had a PCN reaction occurring within the last 10 years: No If all of the above answers are "NO", then may proceed with Cephalosporin use.    REVIEW OF SYSTEMS:   Review of Systems  Constitutional: Negative.  Negative for chills, fever and malaise/fatigue.  HENT: Negative.  Negative for ear discharge, ear pain, hearing loss, nosebleeds and sore throat.   Eyes: Negative.  Negative for blurred vision and pain.  Respiratory: Negative.  Negative for cough, hemoptysis, shortness of breath and wheezing.   Cardiovascular: Negative for palpitations and leg swelling.  Gastrointestinal: Negative.  Negative for abdominal pain, blood in stool, diarrhea, nausea and vomiting.  Genitourinary: Negative.  Negative for dysuria.  Musculoskeletal: Negative.  Negative for back pain.  Skin: Negative.   Neurological: Negative for dizziness, tremors, speech change, focal weakness, seizures and headaches.  Endo/Heme/Allergies: Negative.  Does not bruise/bleed easily.  Psychiatric/Behavioral: Negative.  Negative for depression, hallucinations and suicidal ideas.    MEDICATIONS AT HOME:   Prior to Admission medications   Medication  Sig Start Date End Date Taking? Authorizing Provider  aspirin EC 81 MG EC tablet Take 1 tablet (81 mg total) by mouth daily. 08/20/17  Yes Vaughan Basta, MD  clopidogrel (PLAVIX) 75 MG tablet Take 1 tablet (75 mg total) by mouth daily. 09/05/17  Yes Gollan, Kathlene November, MD  albuterol (PROVENTIL HFA;VENTOLIN HFA) 108 (90 Base) MCG/ACT inhaler Inhale 2 puffs into the lungs every 6 (six) hours  as needed for wheezing or shortness of breath. 08/19/17   Vaughan Basta, MD  bisacodyl (DULCOLAX) 5 MG EC tablet Take 1 tablet (5 mg total) by mouth daily as needed for moderate constipation. 08/19/17   Vaughan Basta, MD  carvedilol (COREG) 3.125 MG tablet Take 1 tablet (3.125 mg total) by mouth 2 (two) times daily. 09/23/17 02/28/26  Rise Mu, PA-C  Darbepoetin Alfa (ARANESP) 500 MCG/ML SOSY injection Inject 500 mcg into the skin every 30 (thirty) days.    [provider]  docusate sodium (COLACE) 100 MG capsule Take 1 capsule (100 mg total) by mouth daily as needed. 09/04/17 09/04/18  Earleen Newport, MD  fluticasone (FLONASE) 50 MCG/ACT nasal spray Place 2 sprays into the nose daily. 02/20/16 02/28/26  [provider]  isosorbide mononitrate (IMDUR) 30 MG 24 hr tablet Take 1 tablet (30 mg total) by mouth 2 (two) times daily. 06/25/18   Minna Merritts, MD  lisinopril (PRINIVIL,ZESTRIL) 10 MG tablet Take 1 tablet (10 mg total) by mouth daily. 01/20/18   Minna Merritts, MD  metoprolol succinate (TOPROL-XL) 25 MG 24 hr tablet Take 12.5 mg by mouth daily. 09/05/17   [provider]  Multiple Vitamin (MULTI-VITAMINS) TABS Take 1 tablet by mouth daily. 02/24/03   [provider]  nitroGLYCERIN (NITROSTAT) 0.4 MG SL tablet Place 1 tablet (0.4 mg total) under the tongue every 5 (five) minutes as needed for chest pain. 06/25/18   Minna Merritts, MD  senna-docusate (SENOKOT-S) 8.6-50 MG tablet Take 2 tablets by mouth daily as needed for mild constipation. 09/04/17 09/04/18  Earleen Newport, MD  simvastatin (ZOCOR) 10 MG tablet Take 10 mg by mouth daily. 05/06/17   [provider]  triamcinolone cream (KENALOG) 0.1 % Apply 1 application topically 2 (two) times daily.    [provider]      VITAL SIGNS:  Blood pressure 126/73, pulse 80, temperature 97.8 F (36.6 C), temperature source Oral, resp. rate 16, height 6' (1.829 m),  weight 74.6 kg, SpO2 100 %.  PHYSICAL EXAMINATION:   Physical Exam  Constitutional: He is oriented to person, place, and time. No distress.  HENT:  Head: Normocephalic.  Eyes: No scleral icterus.  Neck: Normal range of motion. Neck supple. No JVD present. No tracheal deviation present.  Cardiovascular: Normal rate and regular rhythm. Exam reveals no gallop and no friction rub.  Murmur heard.  Systolic murmur is present. Pulmonary/Chest: Effort normal and breath sounds normal. No respiratory distress. He has no wheezes. He has no rales. He exhibits no tenderness.  Abdominal: Soft. Bowel sounds are normal. He exhibits no distension and no mass. There is no tenderness. There is no rebound and no guarding.  Musculoskeletal: Normal range of motion. He exhibits no edema.  Neurological: He is alert and oriented to person, place, and time.  Skin: Skin is warm. No rash noted. No erythema.  Psychiatric: Judgment normal.      LABORATORY PANEL:   CBC Recent Labs  Lab 06/29/18 0630  WBC 3.0*  HGB 10.0*  HCT 31.9*  PLT 82*   ------------------------------------------------------------------------------------------------------------------  Chemistries  Recent Labs  Lab 06/29/18 0630  NA 141  K 4.6  CL 110  CO2 22  GLUCOSE 164*  BUN 23  CREATININE 0.99  CALCIUM 9.7   ------------------------------------------------------------------------------------------------------------------  Cardiac Enzymes Recent Labs  Lab 06/29/18 0630  TROPONINI 0.08*   ------------------------------------------------------------------------------------------------------------------  RADIOLOGY:  Dg Chest Portable 1 View  Result Date: 06/29/2018 CLINICAL DATA:  Acute onset of left-sided chest pain, radiating to the neck. Shortness of breath. EXAM: PORTABLE CHEST 1 VIEW COMPARISON:  Chest radiograph performed 09/01/2017 FINDINGS: The lungs are hyperexpanded, with flattening of the hemidiaphragms,  compatible with COPD. Bibasilar airspace opacities, right greater than left, may reflect asymmetric pulmonary edema or pneumonia. No definite pleural effusion or pneumothorax is seen. The cardiomediastinal silhouette is borderline normal in size. No acute osseous abnormalities are seen. IMPRESSION: 1. Bibasilar airspace opacities, right greater than left, may reflect asymmetric pulmonary edema or pneumonia. 2. Findings of COPD. Electronically Signed   By: Garald Balding M.D.   On: 06/29/2018 06:49    EKG:    EKG showed slight subtle increase in anterior ST elevation but when compared to his prior EKG it was not significant enough. IMPRESSION AND PLAN:    1.  Non-ST elevation MI: Although EKG is abnormal as per cardiology did not meet criteria for ST elevation MI compared to previous EKGs. Cardiac catheterization showed progression of disease in the left circumflex with significant ostial LAD, moderate left main and chronically occluded RCA.  The vessels are heavily calcified and not optimal for angioplasty or PCI according to cardiology.\Recommendations are for aggressive medical management. Continue heparin for 48 hours Consider adding Ranexa Continue optimization of medical therapy Continue Plavix, isosorbide, lisinopril, Zocor and Coreg.  Check A1c and lipid panel Follow troponins and telemetry   2.  Chronic systolic heart failure with EF 30 to 35% and moderate aortic stenosis: Continue Coreg and ACE inhibitor  3.  History of MDS: Patient followed at [facility]  4.  Diabetes: Diet controlled Sliding scale    All the records are reviewed and case discussed with ED provider. Management plans discussed with the patient and he is in agreement  CODE STATUS: FULL  TOTAL TIME TAKING CARE OF THIS PATIENT: 55 minutes.    Nakaila Freeze M.D on 06/29/2018 at 11:41 AM  Between 7am to 6pm - Pager - [phone]  After 6pm go to [email or web address] - password [email or web address] [place] [facility]   Office  [phone]  CC: Primary care physician; Zolotor, Audie Clear, MD

## 2018-06-29 NOTE — Progress Notes (Signed)
Pt unable to void at this time.  >584 in bladder per scanner.  Pt drank 237 ccs cranberry juice, this RN has placed a warm blanket on his ABD, will call MD for orders if he does not void before 1600

## 2018-06-29 NOTE — Consult Note (Signed)
Cardiology Consultation:   Patient ID: Terry Gonzales MRN: [id number]; DOB: 15-Apr-1925  Admit date: 06/29/2018 Date of Consult: 06/29/2018  Primary Care Provider: Zolotor, Audie Clear, MD Primary Cardiologist: Dr. Rockey Situ Primary Electrophysiologist:  None    Patient Profile:   Terry Gonzales is a 84 y.o. male with a hx of severe three-vessel coronary artery disease and moderate aortic stenosis who is being seen today for the evaluation of possible anterior ST elevation myocardial infarction at the request of Dr. Mable Paris.  History of Present Illness:   Terry Gonzales is a 84 year old male with multiple complex medical problems.  He is known to have severe three-vessel coronary artery disease as he was cathed in January 2019 by Dr. Rockey Situ.  Catheterization at that time showed chronically occluded RCA with collaterals from the circumflex and significant ostial LAD and left circumflex disease with heavy calcifications.  Ejection fraction was 30 to 35% with moderate aortic stenosis.  The patient was deemed to be not a candidate for CABG and has been treated medically.  He also has known history of myelodysplastic syndrome with chronic anemia and thrombocytopenia.  The patient has been followed closely by Dr. Rockey Situ with worsening angina.  His M door was increased last week but the patient continued to have frequent chest pain.  He presented this morning with chest pain that happened around 5:00 in the morning.  It was more severe than his regular angina and did not improve with nitroglycerin.  It continued in the ED.  His EKG showed slight subtle increase in anterior ST elevation but when compared to his prior EKG it was not significant enough.  I did not feel that the patient was having an anterior ST elevation myocardial infarction but given his continued uncontrolled chest pain, I have recommended at least proceeding with urgent cardiac catheterization to make sure that there has been no new occlusion  since his most recent cardiac catheterization.  This has required extensive discussion with the patient and his family regarding the merits and benefits and thus there was delay in taking the patient to the Cath Lab.  Past Medical History:  Diagnosis Date  . Aortic stenosis   . CAD (coronary artery disease)    a. NSTEMI 1/19 - LHC 1/19: ostial LAD 75%, proximal LAD 80%, ostial LCx 75%, mid LCX 75%, OM2 80%, ostial RCA 99%, mid RCA 95%, distal RCA 99%, med Rx  . Diabetes mellitus without complication ([place])   . Hypertension   . Ischemic cardiomyopathy    a. TTE 1/19: EF of 30-35%, hypokinesis of the anterior, anteroseptal, and apical myocardium, Gr1DD, moderate aortic stenosis (may be under estimated due to his cardiomyopathy), RV systolic function normal, left atrium normal size, unable to estimate PASP  . Myelodysplastic syndrome [facility])     Past Surgical History:  Procedure Laterality Date  . LEFT HEART CATH AND CORONARY ANGIOGRAPHY N/A 08/16/2017   Procedure: LEFT HEART CATH AND CORONARY ANGIOGRAPHY;  Surgeon: Minna Merritts, MD;  Location: [place] [facility];  Service: Cardiovascular;  Laterality: N/A;     Home Medications:  Prior to Admission medications   Medication Sig Start Date End Date Taking? Authorizing Provider  aspirin EC 81 MG EC tablet Take 1 tablet (81 mg total) by mouth daily. 08/20/17  Yes Vaughan Basta, MD  clopidogrel (PLAVIX) 75 MG tablet Take 1 tablet (75 mg total) by mouth daily. 09/05/17  Yes Gollan, Kathlene November, MD  albuterol (PROVENTIL HFA;VENTOLIN HFA) 108 (90 Base) MCG/ACT inhaler Inhale 2 puffs  into the lungs every 6 (six) hours as needed for wheezing or shortness of breath. 08/19/17   Vaughan Basta, MD  bisacodyl (DULCOLAX) 5 MG EC tablet Take 1 tablet (5 mg total) by mouth daily as needed for moderate constipation. 08/19/17   Vaughan Basta, MD  carvedilol (COREG) 3.125 MG tablet Take 1 tablet (3.125 mg total) by mouth 2 (two) times daily.  09/23/17 02/28/26  Rise Mu, PA-C  Darbepoetin Alfa (ARANESP) 500 MCG/ML SOSY injection Inject 500 mcg into the skin every 30 (thirty) days.    [provider]  docusate sodium (COLACE) 100 MG capsule Take 1 capsule (100 mg total) by mouth daily as needed. 09/04/17 09/04/18  Earleen Newport, MD  fluticasone (FLONASE) 50 MCG/ACT nasal spray Place 2 sprays into the nose daily. 02/20/16 02/28/26  [provider]  isosorbide mononitrate (IMDUR) 30 MG 24 hr tablet Take 1 tablet (30 mg total) by mouth 2 (two) times daily. 06/25/18   Minna Merritts, MD  lisinopril (PRINIVIL,ZESTRIL) 10 MG tablet Take 1 tablet (10 mg total) by mouth daily. 01/20/18   Minna Merritts, MD  metoprolol succinate (TOPROL-XL) 25 MG 24 hr tablet Take 12.5 mg by mouth daily. 09/05/17   [provider]  Multiple Vitamin (MULTI-VITAMINS) TABS Take 1 tablet by mouth daily. 02/24/03   [provider]  nitroGLYCERIN (NITROSTAT) 0.4 MG SL tablet Place 1 tablet (0.4 mg total) under the tongue every 5 (five) minutes as needed for chest pain. 06/25/18   Minna Merritts, MD  senna-docusate (SENOKOT-S) 8.6-50 MG tablet Take 2 tablets by mouth daily as needed for mild constipation. 09/04/17 09/04/18  Earleen Newport, MD  simvastatin (ZOCOR) 10 MG tablet Take 10 mg by mouth daily. 05/06/17   [provider]  triamcinolone cream (KENALOG) 0.1 % Apply 1 application topically 2 (two) times daily.    [provider]    Inpatient Medications: Scheduled Meds: . fentaNYL      . fentaNYL (SUBLIMAZE) injection  50 mcg Intravenous Once   Continuous Infusions: . nitroGLYCERIN    . sodium chloride     PRN Meds:   Allergies:    Allergies  Allergen Reactions  . Penicillins Swelling    Has patient had a PCN reaction causing immediate rash, facial/tongue/throat swelling, SOB or lightheadedness with hypotension: Yes Has patient had a PCN reaction causing severe rash involving mucus  membranes or skin necrosis: No Has patient had a PCN reaction that required hospitalization: No Has patient had a PCN reaction occurring within the last 10 years: No If all of the above answers are "NO", then may proceed with Cephalosporin use.    Social History:   Social History   Socioeconomic History  . Marital status: Married    Spouse name: Not on file  . Number of children: Not on file  . Years of education: Not on file  . Highest education level: Not on file  Occupational History  . Occupation: retired  Scientific laboratory technician  . Financial resource strain: Not on file  . Food insecurity:    Worry: Not on file    Inability: Not on file  . Transportation needs:    Medical: Not on file    Non-medical: Not on file  Tobacco Use  . Smoking status: Former Smoker    Packs/day: 1.00    Types: Cigarettes    Last attempt to quit: 08/13/1968    Years since quitting: 49.9  . Smokeless tobacco: Never Used  Substance  and Sexual Activity  . Alcohol use: No    Frequency: Never  . Drug use: No  . Sexual activity: Never  Lifestyle  . Physical activity:    Days per week: Not on file    Minutes per session: Not on file  . Stress: Not on file  Relationships  . Social connections:    Talks on phone: Not on file    Gets together: Not on file    Attends religious service: Not on file    Active member of club or organization: Not on file    Attends meetings of clubs or organizations: Not on file    Relationship status: Not on file  . Intimate partner violence:    Fear of current or ex partner: Not on file    Emotionally abused: Not on file    Physically abused: Not on file    Forced sexual activity: Not on file  Other Topics Concern  . Not on file  Social History Narrative  . Not on file    Family History:    Family History  Problem Relation Age of Onset  . Hypertension Father      ROS:  Please see the history of present illness.   All other ROS reviewed and negative.      Physical Exam/Data:   Vitals:   06/29/18 0645 06/29/18 0700 06/29/18 0735 06/29/18 0754  BP: 134/84 117/88    Pulse: 92 (!) 58    Resp: 19 12    Temp:      TempSrc:      SpO2: 95% 91% 95% 92%  Weight:       No intake or output data in the 24 hours ending 06/29/18 0832 Filed Weights   06/29/18 0642  Weight: 72.6 kg   Body mass index is 21.7 kg/m.  General:  Well nourished, well developed, in no acute distress HEENT: normal Lymph: no adenopathy Neck: no JVD Endocrine:  No thryomegaly Vascular: No carotid bruits; FA pulses 2+ bilaterally without bruits  Cardiac:  normal S1, S2; RRR; 2 out of 6 crescendo decrescendo systolic murmur in the aortic area which is mid peaking.  S2 is audible. Lungs:  clear to auscultation bilaterally, no wheezing, rhonchi or rales  Abd: soft, nontender, no hepatomegaly  Ext: no edema Musculoskeletal:  No deformities, BUE and BLE strength normal and equal Skin: warm and dry  Neuro:  CNs 2-12 intact, no focal abnormalities noted Psych:  Normal affect   EKG:  The EKG was personally reviewed and demonstrates: Normal sinus rhythm with IVCD and less than 1 mm of anterior ST elevation.  This ST elevation was noted in his prior EKG. Telemetry:  Telemetry was personally reviewed and demonstrates:  na  Relevant CV Studies:   Laboratory Data:  Chemistry Recent Labs  Lab 06/29/18 0630  NA 141  K 4.6  CL 110  CO2 22  GLUCOSE 164*  BUN 23  CREATININE 0.99  CALCIUM 9.7  GFRNONAA >60  GFRAA >60  ANIONGAP 9    No results for input(s): PROT, ALBUMIN, AST, ALT, ALKPHOS, BILITOT in the last 168 hours. Hematology Recent Labs  Lab 06/29/18 0630  WBC 3.0*  RBC 2.76*  HGB 10.0*  HCT 31.9*  MCV 115.6*  MCH 36.2*  MCHC 31.3  RDW 15.5  PLT 82*   Cardiac Enzymes Recent Labs  Lab 06/29/18 0630  TROPONINI 0.08*   No results for input(s): TROPIPOC in the last 168 hours.  BNPNo results for input(s):  BNP, PROBNP in the last 168 hours.   DDimer No results for input(s): DDIMER in the last 168 hours.  Radiology/Studies:  Dg Chest Portable 1 View  Result Date: 06/29/2018 CLINICAL DATA:  Acute onset of left-sided chest pain, radiating to the neck. Shortness of breath. EXAM: PORTABLE CHEST 1 VIEW COMPARISON:  Chest radiograph performed 09/01/2017 FINDINGS: The lungs are hyperexpanded, with flattening of the hemidiaphragms, compatible with COPD. Bibasilar airspace opacities, right greater than left, may reflect asymmetric pulmonary edema or pneumonia. No definite pleural effusion or pneumothorax is seen. The cardiomediastinal silhouette is borderline normal in size. No acute osseous abnormalities are seen. IMPRESSION: 1. Bibasilar airspace opacities, right greater than left, may reflect asymmetric pulmonary edema or pneumonia. 2. Findings of COPD. Electronically Signed   By: Garald Balding M.D.   On: 06/29/2018 06:49    Assessment and Plan:   1. Non-ST elevation myocardial infarction: Although his EKG is abnormal, it does not meet criteria for ST elevation myocardial infarction when compared with his old EKG.  Nonetheless, given the patient continued chest pain and known history of coronary artery disease, I have recommended proceeding with urgent cardiac catheterization which was done and showed progression of disease in the left circumflex with significant ostial LAD, moderate left main and chronically occluded RCA.  The vessels are heavily calcified and not optimal for angioplasty or PCI without atherectomy.  Atherectomy cannot be performed safely without mechanical support given his low ejection fraction.  Given his age and comorbidities including myelodysplastic syndrome, I do not think he is a candidate for that.  Unfortunately, his options are limited.  Recommend continuing medical therapy and optimizing his antianginal medications.  Use heparin for 48 hours.  Consider adding Ranexa. 2. Chronic systolic heart failure: LVEDP was 22.   Limit IV fluids.  Continue lisinopril carvedilol. 3. Aortic stenosis: There was only mild gradient today by cardiac catheterization and it does not seem that his aortic stenosis is severe.   For questions or updates, please contact [place] Please consult [email or web address] for contact info under     Signed, Kathlyn Sacramento, MD  06/29/2018 8:32 AM

## 2018-06-29 NOTE — Progress Notes (Signed)
Family Meeting Note  Advance Directive:yes  Today a meeting took place with the Patient.spouse  The following clinical team members were present during this meeting:MD RN  The following were discussed:Patient's diagnosis:NSTEMI , Patient's progosis: Unable to determine and Goals for treatment: DNR  Additional follow-up to be provided: FULL NO Change in manamgement  Time spent during discussion:16 minutes  Delcie Ruppert, MD

## 2018-06-29 NOTE — Progress Notes (Signed)
Mr. Terry Gonzales complained of chest pain 4 out of 10. Carolan Shiver, NP aware. Ordered to give 1 mg of morphine. Patient is now resting quietly with no discomfort. Will continue to monitor.

## 2018-06-29 NOTE — Consult Note (Signed)
Name: Terry Gonzales MRN: [id number] DOB: October 23, 1924     CONSULTATION DATE: 11.17.19 REFERRING MD : Fletcher Anon  CHIEF COMPLAINT: chest pain    HISTORY OF PRESENT ILLNESS: 84 year old male with multiple complex medical problems.  He is known to have severe three-vessel coronary artery disease as he was cathed in January 2019 by Dr. Rockey Situ.  Catheterization at that time showed chronically occluded RCA with collaterals from the circumflex and significant ostial LAD and left circumflex disease with heavy calcifications.  Ejection fraction was 30 to 35% with moderate aortic stenosis.  The patient was deemed to be not a candidate for CABG and has been treated medically.  He also has known history of myelodysplastic syndrome with chronic anemia and thrombocytopenia.  The patient has been followed closely by Dr. Rockey Situ with worsening angina.   He presented this morning with chest pain that happened around 5:00 in the morning.  CODE STEMI CALLED S/p cath-no intervention performed Severe CAD  Transferred to [place] for close monitoring  PAST MEDICAL HISTORY :   has a past medical history of Aortic stenosis, CAD (coronary artery disease), Diabetes mellitus without complication ([place]), Hypertension, Ischemic cardiomyopathy, and Myelodysplastic syndrome (Terry Gonzales).  has a past surgical history that includes LEFT HEART CATH AND CORONARY ANGIOGRAPHY (N/A, 08/16/2017). Prior to Admission medications   Medication Sig Start Date End Date Taking? Authorizing Provider  aspirin EC 81 MG EC tablet Take 1 tablet (81 mg total) by mouth daily. 08/20/17  Yes Vaughan Basta, MD  clopidogrel (PLAVIX) 75 MG tablet Take 1 tablet (75 mg total) by mouth daily. 09/05/17  Yes Gollan, Kathlene November, MD  albuterol (PROVENTIL HFA;VENTOLIN HFA) 108 (90 Base) MCG/ACT inhaler Inhale 2 puffs into the lungs every 6 (six) hours as needed for wheezing or shortness of breath. 08/19/17   Vaughan Basta, MD  bisacodyl (DULCOLAX) 5 MG EC tablet  Take 1 tablet (5 mg total) by mouth daily as needed for moderate constipation. 08/19/17   Vaughan Basta, MD  carvedilol (COREG) 3.125 MG tablet Take 1 tablet (3.125 mg total) by mouth 2 (two) times daily. 09/23/17 02/28/26  Rise Mu, PA-C  Darbepoetin Alfa (ARANESP) 500 MCG/ML SOSY injection Inject 500 mcg into the skin every 30 (thirty) days.    [provider]  docusate sodium (COLACE) 100 MG capsule Take 1 capsule (100 mg total) by mouth daily as needed. 09/04/17 09/04/18  Earleen Newport, MD  fluticasone (FLONASE) 50 MCG/ACT nasal spray Place 2 sprays into the nose daily. 02/20/16 02/28/26  [provider]  isosorbide mononitrate (IMDUR) 30 MG 24 hr tablet Take 1 tablet (30 mg total) by mouth 2 (two) times daily. 06/25/18   Minna Merritts, MD  lisinopril (PRINIVIL,ZESTRIL) 10 MG tablet Take 1 tablet (10 mg total) by mouth daily. 01/20/18   Minna Merritts, MD  metoprolol succinate (TOPROL-XL) 25 MG 24 hr tablet Take 12.5 mg by mouth daily. 09/05/17   [provider]  Multiple Vitamin (MULTI-VITAMINS) TABS Take 1 tablet by mouth daily. 02/24/03   [provider]  nitroGLYCERIN (NITROSTAT) 0.4 MG SL tablet Place 1 tablet (0.4 mg total) under the tongue every 5 (five) minutes as needed for chest pain. 06/25/18   Minna Merritts, MD  senna-docusate (SENOKOT-S) 8.6-50 MG tablet Take 2 tablets by mouth daily as needed for mild constipation. 09/04/17 09/04/18  Earleen Newport, MD  simvastatin (ZOCOR) 10 MG tablet Take 10 mg by mouth daily. 05/06/17   [provider]  triamcinolone cream (KENALOG)  0.1 % Apply 1 application topically 2 (two) times daily.    [provider]   Allergies  Allergen Reactions  . Penicillins Swelling    Has patient had a PCN reaction causing immediate rash, facial/tongue/throat swelling, SOB or lightheadedness with hypotension: Yes Has patient had a PCN reaction causing severe rash involving mucus membranes  or skin necrosis: No Has patient had a PCN reaction that required hospitalization: No Has patient had a PCN reaction occurring within the last 10 years: No If all of the above answers are "NO", then may proceed with Cephalosporin use.    FAMILY HISTORY:  family history includes Hypertension in his father. SOCIAL HISTORY:  reports that he quit smoking about 49 years ago. His smoking use included cigarettes. He smoked 1.00 pack per day. He has never used smokeless tobacco. He reports that he does not drink alcohol or use drugs.  REVIEW OF SYSTEMS:   Constitutional  +malaise/fatigue  HENT: Negative for hearing loss, ear pain, nosebleeds, congestion, sore throat, neck pain, tinnitus and ear discharge.   Eyes: Negative for blurred vision, double vision, photophobia, pain, discharge and redness.  Respiratory: Negative for cough, hemoptysis, sputum production, shortness of breath, wheezing and stridor.   Cardiovascular: + chest pain, Gastrointestinal: Negative for heartburn, nausea, vomiting, abdominal pain, diarrhea, constipation, blood in stool and melena.  Genitourinary: Negative for dysuria, urgency, frequency, hematuria and flank pain.  Musculoskeletal: Negative for myalgias, back pain, joint pain and falls.  Skin: Negative for itching and rash.  Neurological: Negative for dizziness, tingling, tremors, sensory change, speech change, focal weakness, seizures, loss of consciousness, weakness and headaches.  Endo/Heme/Allergies: Negative for environmental allergies and polydipsia. Does not bruise/bleed easily.  ALL OTHER ROS ARE NEGATIVE    VITAL SIGNS: Temp:  [97.8 F (36.6 C)-98 F (36.7 C)] 97.8 F (36.6 C) (11/17 0833) Pulse Rate:  [58-98] 84 (11/17 0900) Resp:  [12-19] 16 (11/17 0900) BP: (117-145)/(80-97) 128/80 (11/17 0900) SpO2:  [91 %-100 %] 100 % (11/17 0900) Weight:  [72.6 kg-74.6 kg] 74.6 kg (11/17 0833)  Physical Examination:   GENERAL:NAD, no fevers, chills, no  weakness no fatigue HEAD: Normocephalic, atraumatic.  EYES: Pupils equal, round, reactive to light. Extraocular muscles intact. No scleral icterus.  MOUTH: Moist mucosal membrane.   EAR, NOSE, THROAT: Clear without exudates. No external lesions.  NECK: Supple. No thyromegaly. No nodules. No JVD.  PULMONARY:CTA B/L no wheezes, no crackles, no rhonchi CARDIOVASCULAR: S1 and S2. Regular rate and rhythm. No murmurs, rubs, or gallops. No edema.  GASTROINTESTINAL: Soft, nontender, nondistended. No masses. Positive bowel sounds.  MUSCULOSKELETAL: No swelling, clubbing, or edema. Range of motion full in all extremities.  NEUROLOGIC: Cranial nerves II through XII are intact. No gross focal neurological deficits.  SKIN: No ulceration, lesions, rashes, or cyanosis. Skin warm and dry. Turgor intact.  PSYCHIATRIC: Mood, affect within normal limits. The patient is awake, alert and oriented x 3. Insight, judgment intact.      ASSESSMENT / PLAN:  Unstable Angina-Severe CAD end stage systolic CHF, aortic stenosis No intervention at this time Medical management per cardiology Follow up cardiology recs  Transfer to gen med floor advacne diet as tolerated  Recommend Palliative      Nicholi Ghuman Patricia Pesa, M.D.  [facility]  Medical Director [place] Director [facility] Cardio-Pulmonary Department

## 2018-06-29 NOTE — Progress Notes (Signed)
Pt reporting CP 8/10 on 0-10 scale at 1045 this AM, S/P cardiac cath with no intervention.  [place] O2 (3 liters) had been removed prior to this incident by me d/t O2 sats 100%.  Writing RN did bedside EKG, sent to chart but would not print.  No ST elevation observed, although there was ST depression.  [place] at 4 liters was placed back on patient. Offered nitroglycerine but by that time CP had resolved, possibly due to reduced demand with supp O2? Pt was given APAP and his other meds, no swallowing issues observed. He ate his lunch with minimal assist from family

## 2018-06-29 NOTE — ED Notes (Signed)
Pt taken to cath lab at this time by this RN

## 2018-06-29 NOTE — ED Notes (Signed)
Dr arida at bedside.  

## 2018-06-30 ENCOUNTER — Encounter: Payer: Self-pay | Admitting: Cardiovascular Disease

## 2018-06-30 DIAGNOSIS — J189 Pneumonia, unspecified organism: Secondary | ICD-10-CM

## 2018-06-30 LAB — CBC
HEMATOCRIT: 30.2 % — AB (ref 39.0–52.0)
Hemoglobin: 9.6 g/dL — ABNORMAL LOW (ref 13.0–17.0)
MCH: 36.6 pg — ABNORMAL HIGH (ref 26.0–34.0)
MCHC: 31.8 g/dL (ref 30.0–36.0)
MCV: 115.3 fL — AB (ref 80.0–100.0)
NRBC: 0 % (ref 0.0–0.2)
PLATELETS: 81 10*3/uL — AB (ref 150–400)
RBC: 2.62 MIL/uL — AB (ref 4.22–5.81)
RDW: 15.2 % (ref 11.5–15.5)
WBC: 3.2 10*3/uL — AB (ref 4.0–10.5)

## 2018-06-30 LAB — BASIC METABOLIC PANEL
Anion gap: 5 (ref 5–15)
BUN: 22 mg/dL (ref 8–23)
CALCIUM: 8.9 mg/dL (ref 8.9–10.3)
CHLORIDE: 105 mmol/L (ref 98–111)
CO2: 25 mmol/L (ref 22–32)
Creatinine, Ser: 0.77 mg/dL (ref 0.61–1.24)
GFR calc Af Amer: >60 mL/min (ref >60)
GFR calc non Af Amer: >60 mL/min (ref >60)
Glucose, Bld: 181 mg/dL — ABNORMAL HIGH (ref 70–99)
Potassium: 4.4 mmol/L (ref 3.5–5.1)
Sodium: 135 mmol/L (ref 135–145)

## 2018-06-30 LAB — GLUCOSE, CAPILLARY
GLUCOSE-CAPILLARY: 154 mg/dL — AB (ref 70–99)
Glucose-Capillary: 109 mg/dL — ABNORMAL HIGH (ref 70–99)
Glucose-Capillary: 135 mg/dL — ABNORMAL HIGH (ref 70–99)
Glucose-Capillary: 129 mg/dL — ABNORMAL HIGH (ref 70–99)

## 2018-06-30 LAB — TROPONIN I: Troponin I: 5.23 ng/mL (ref <0.03)

## 2018-06-30 LAB — BRAIN NATRIURETIC PEPTIDE: B Natriuretic Peptide: 1420 pg/mL — ABNORMAL HIGH (ref 0.0–100.0)

## 2018-06-30 LAB — HEPARIN LEVEL (UNFRACTIONATED)
HEPARIN UNFRACTIONATED: 0.14 [IU]/mL — AB (ref 0.30–0.70)
HEPARIN UNFRACTIONATED: 0.31 [IU]/mL (ref 0.30–0.70)

## 2018-06-30 LAB — PROCALCITONIN: Procalcitonin: <0.1 ng/mL

## 2018-06-30 MED ORDER — HEPARIN BOLUS VIA INFUSION
2200.0000 [IU] | Freq: Once | INTRAVENOUS | Status: AC
  Administered 2018-06-30: 2200 [IU] via INTRAVENOUS
  Filled 2018-06-30: qty 2200

## 2018-06-30 MED ORDER — FUROSEMIDE 10 MG/ML IJ SOLN
40.0000 mg | Freq: Once | INTRAMUSCULAR | Status: AC
  Administered 2018-06-30: 40 mg via INTRAVENOUS
  Filled 2018-06-30: qty 4

## 2018-06-30 MED ORDER — RANOLAZINE ER 500 MG PO TB12
500.0000 mg | ORAL_TABLET | Freq: Two times a day (BID) | ORAL | Status: DC
  Administered 2018-06-30 – 2018-07-06 (×13): 500 mg via ORAL
  Filled 2018-06-30 (×15): qty 1

## 2018-06-30 MED ORDER — ALUM & MAG HYDROXIDE-SIMETH 200-200-20 MG/5ML PO SUSP
30.0000 mL | ORAL | Status: DC | PRN
  Administered 2018-06-30 – 2018-07-04 (×3): 30 mL via ORAL
  Filled 2018-06-30 (×4): qty 30

## 2018-06-30 MED ORDER — VANCOMYCIN HCL 10 G IV SOLR
1250.0000 mg | INTRAVENOUS | Status: DC
  Administered 2018-07-01: 1250 mg via INTRAVENOUS
  Filled 2018-06-30 (×2): qty 1250

## 2018-06-30 MED ORDER — LEVOFLOXACIN IN D5W 750 MG/150ML IV SOLN: 750.0000 mg | INTRAVENOUS | Status: DC

## 2018-06-30 MED ORDER — VANCOMYCIN HCL 10 G IV SOLR
1250.0000 mg | Freq: Once | INTRAVENOUS | Status: AC
  Administered 2018-06-30: 1250 mg via INTRAVENOUS
  Filled 2018-06-30: qty 1250

## 2018-06-30 MED ORDER — ORAL CARE MOUTH RINSE
15.0000 mL | Freq: Two times a day (BID) | OROMUCOSAL | Status: DC
  Administered 2018-06-30 – 2018-07-05 (×9): 15 mL via OROMUCOSAL

## 2018-06-30 MED ORDER — SODIUM CHLORIDE 0.9 % IV SOLN
2.0000 g | Freq: Two times a day (BID) | INTRAVENOUS | Status: DC
  Administered 2018-06-30 – 2018-07-05 (×12): 2 g via INTRAVENOUS
  Filled 2018-06-30 (×15): qty 2

## 2018-06-30 MED ORDER — LEVOFLOXACIN IN D5W 750 MG/150ML IV SOLN
750.0000 mg | Freq: Once | INTRAVENOUS | Status: DC
  Administered 2018-06-30: 750 mg via INTRAVENOUS
  Filled 2018-06-30: qty 150

## 2018-06-30 NOTE — Progress Notes (Signed)
Name: Terry Gonzales MRN: [id number] DOB: 1924/11/27     CONSULTATION DATE: 06/29/2018  Subjective & Objectives: On Heparin gtt. Reacted to Levofloxacin (localised burning and itching)  PAST MEDICAL HISTORY :   has a past medical history of Aortic stenosis, CAD (coronary artery disease), Diabetes mellitus without complication ([place]), Hypertension, Ischemic cardiomyopathy, and Myelodysplastic syndrome ([place]).  has a past surgical history that includes LEFT HEART CATH AND CORONARY ANGIOGRAPHY (N/A, 08/16/2017); Coronary/Graft Acute MI Revascularization (N/A, 06/29/2018); and LEFT HEART CATH AND CORONARY ANGIOGRAPHY (N/A, 06/29/2018). Prior to Admission medications   Medication Sig Start Date End Date Taking? Authorizing Provider  aspirin EC 81 MG EC tablet Take 1 tablet (81 mg total) by mouth daily. 08/20/17  Yes Vaughan Basta, MD  clopidogrel (PLAVIX) 75 MG tablet Take 1 tablet (75 mg total) by mouth daily. 09/05/17  Yes Gollan, Kathlene November, MD  albuterol (PROVENTIL HFA;VENTOLIN HFA) 108 (90 Base) MCG/ACT inhaler Inhale 2 puffs into the lungs every 6 (six) hours as needed for wheezing or shortness of breath. 08/19/17   Vaughan Basta, MD  bisacodyl (DULCOLAX) 5 MG EC tablet Take 1 tablet (5 mg total) by mouth daily as needed for moderate constipation. 08/19/17   Vaughan Basta, MD  carvedilol (COREG) 3.125 MG tablet Take 1 tablet (3.125 mg total) by mouth 2 (two) times daily. 09/23/17 02/28/26  Rise Mu, PA-C  Darbepoetin Alfa (ARANESP) 500 MCG/ML SOSY injection Inject 500 mcg into the skin every 30 (thirty) days.    [provider]  docusate sodium (COLACE) 100 MG capsule Take 1 capsule (100 mg total) by mouth daily as needed. 09/04/17 09/04/18  Earleen Newport, MD  fluticasone (FLONASE) 50 MCG/ACT nasal spray Place 2 sprays into the nose daily. 02/20/16 02/28/26  [provider]  isosorbide mononitrate (IMDUR) 30 MG 24 hr tablet Take 1 tablet (30 mg total)  by mouth 2 (two) times daily. 06/25/18   Minna Merritts, MD  lisinopril (PRINIVIL,ZESTRIL) 10 MG tablet Take 1 tablet (10 mg total) by mouth daily. 01/20/18   Minna Merritts, MD  metoprolol succinate (TOPROL-XL) 25 MG 24 hr tablet Take 12.5 mg by mouth daily. 09/05/17   [provider]  Multiple Vitamin (MULTI-VITAMINS) TABS Take 1 tablet by mouth daily. 02/24/03   [provider]  nitroGLYCERIN (NITROSTAT) 0.4 MG SL tablet Place 1 tablet (0.4 mg total) under the tongue every 5 (five) minutes as needed for chest pain. 06/25/18   Minna Merritts, MD  senna-docusate (SENOKOT-S) 8.6-50 MG tablet Take 2 tablets by mouth daily as needed for mild constipation. 09/04/17 09/04/18  Earleen Newport, MD  simvastatin (ZOCOR) 10 MG tablet Take 10 mg by mouth daily. 05/06/17   [provider]  triamcinolone cream (KENALOG) 0.1 % Apply 1 application topically 2 (two) times daily.    [provider]   Allergies  Allergen Reactions  . Penicillins Swelling    Has patient had a PCN reaction causing immediate rash, facial/tongue/throat swelling, SOB or lightheadedness with hypotension: Yes Has patient had a PCN reaction causing severe rash involving mucus membranes or skin necrosis: No Has patient had a PCN reaction that required hospitalization: No Has patient had a PCN reaction occurring within the last 10 years: No If all of the above answers are "NO", then may proceed with Cephalosporin use.    FAMILY HISTORY:  family history includes Hypertension in his father. SOCIAL HISTORY:  reports that he quit smoking about 49 years ago. His smoking use included  cigarettes. He smoked 1.00 pack per day. He has never used smokeless tobacco. He reports that he does not drink alcohol or use drugs.  REVIEW OF SYSTEMS:   Unable to obtain due to critical illness   VITAL SIGNS: Temp:  [97.5 F (36.4 C)-99.2 F (37.3 C)] 97.7 F (36.5 C) (11/18 0800) Pulse Rate:  [64-93] 80  (11/18 1000) Resp:  [11-27] 21 (11/18 1000) BP: (100-139)/(57-86) 115/74 (11/18 1000) SpO2:  [92 %-100 %] 94 % (11/18 1000)  Physical Examination:  A & O and no focal neuro deficits [place], no distress, able to talk in full sentences. BEAE and no rales S1 & S2 audible and no murmur Benign abdomen with normal peristalses No edema   ASSESSMENT / PLAN: Acute respiratory failure tolerating [place]. -Monitor work of breathing and O2 sat  NSTEMI / instable angina, CAD 3 vessel disease ( RCA, ostial LAD and Lt. Cx) not amenable for PCI, cardiomyopathy with LV EF 30-35% and acute on chronic CHF. -Lisinopril + Carvidalol + Heparin gtt. + Ranexa + ASA + Plavix + Statins +  Diuresis -Management as per cardiology.   Atelectasis and pneumonia. Bibasilar airspace disease. MRSA PCR +ve -Vanc + Cefep. Monitor CXR + CBC + FIO2 -Developed reaction to Levofloxacin (localised arm burning and itching at infusion site)  DM -Glycemic control  HTN -Optimize antihypertensives and monitor hemodynamics.  Myelodysplastic syndrome, thrombocytopenia and anemia -Monitor Plat -Keep HB > 8 gm/dl -Monitor CBC  Full code  Supportive care  Critical care time 40 min

## 2018-06-30 NOTE — Progress Notes (Signed)
[place] for heparin Indication: chest pain/ACS  Allergies  Allergen Reactions  . Penicillins Swelling    Has patient had a PCN reaction causing immediate rash, facial/tongue/throat swelling, SOB or lightheadedness with hypotension: Yes Has patient had a PCN reaction causing severe rash involving mucus membranes or skin necrosis: No Has patient had a PCN reaction that required hospitalization: No Has patient had a PCN reaction occurring within the last 10 years: No If all of the above answers are "NO", then may proceed with Cephalosporin use.  [name] [Levofloxacin] Itching    Patient Measurements: Height: 6' (182.9 cm) Weight: 164 lb 7.4 oz (74.6 kg) IBW/kg (Calculated) : 77.6 Heparin Dosing Weight: 74.6 kg  Vital Signs: Temp: 97.6 F (36.4 C) (11/18 1600) Temp Source: Oral (11/18 1600) BP: 108/64 (11/18 1700) Pulse Rate: 64 (11/18 1700)  Labs: Recent Labs    06/29/18 0630 06/29/18 0704 06/30/18 0011 06/30/18 0949 06/30/18 1855  HGB 10.0*  --   --  9.6*  --   HCT 31.9*  --   --  30.2*  --   PLT 82*  --   --  81*  --   APTT  --  >160*  --   --   --   LABPROT  --  17.7*  --   --   --   INR  --  1.47  --   --   --   HEPARINUNFRC  --   --  <0.10* 0.14* 0.31  CREATININE 0.99  --   --  0.77  --   TROPONINI 0.08*  --   --  5.23*  --     Estimated Creatinine Clearance: 63.5 mL/min (by C-G formula based on SCr of 0.77 mg/dL).   Medical History: Past Medical History:  Diagnosis Date  . Aortic stenosis   . CAD (coronary artery disease)    a. NSTEMI 1/19 - LHC 1/19: ostial LAD 75%, proximal LAD 80%, ostial LCx 75%, mid LCX 75%, OM2 80%, ostial RCA 99%, mid RCA 95%, distal RCA 99%, med Rx  . Diabetes mellitus without complication ([address])   . Hypertension   . Ischemic cardiomyopathy    a. TTE 1/19: EF of 30-35%, hypokinesis of the anterior, anteroseptal, and apical myocardium, Gr1DD, moderate aortic stenosis (may be under estimated  due to his cardiomyopathy), RV systolic function normal, left atrium normal size, unable to estimate PASP  . Myelodysplastic syndrome [facility])     Assessment: 84 year old male admitted with chest pain. Patient s/p cardiac catheterization. Recommendation from Cardiology to use heparin for 48 hours.  Heparin infusing at 1050 units/hr  11/18 ~10:00 HL 0.14 11/18 1855 Heparin level - 0.31  Goal of Therapy:  Heparin level 0.3-0.7 units/ml Monitor platelets by anticoagulation protocol: Yes   Plan:  HL is therapeutic. Will continue infusion rate to 1300 units/hr. Recheck heparin level in 8 hours. CBC with AM labs per protocol.   Paulina Fusi, PharmD Clinical Pharmacist 06/30/2018 7:20 PM

## 2018-06-30 NOTE — Progress Notes (Signed)
[place] [facility] [place] NAME: Terry Gonzales    MR#:  [id number]  DATE OF BIRTH:  28-Feb-1925  SUBJECTIVE:   Patient reported having chest pain yesterday Also feels short of breath Just received IV Lasix Family at bedside  REVIEW OF SYSTEMS:    Review of Systems  Constitutional: Negative for fever, chills weight loss HENT: Negative for ear pain, nosebleeds, congestion, facial swelling, rhinorrhea, neck pain, neck stiffness and ear discharge.   Respiratory: ++for cough, shortness of breath, NO wheezing  Cardiovascular:B, NO palpitations and leg swelling.  Gastrointestinal: Negative for heartburn, abdominal pain, vomiting, diarrhea or consitpation Genitourinary: Negative for dysuria, urgency, frequency, hematuria Musculoskeletal: Negative for back pain or joint pain Neurological: Negative for dizziness, seizures, syncope, focal weakness,  numbness and headaches.  Hematological: Does not bruise/bleed easily.  Psychiatric/Behavioral: Negative for hallucinations, confusion, dysphoric mood    Tolerating Diet: yes      DRUG ALLERGIES:   Allergies  Allergen Reactions  . Penicillins Swelling    Has patient had a PCN reaction causing immediate rash, facial/tongue/throat swelling, SOB or lightheadedness with hypotension: Yes Has patient had a PCN reaction causing severe rash involving mucus membranes or skin necrosis: No Has patient had a PCN reaction that required hospitalization: No Has patient had a PCN reaction occurring within the last 10 years: No If all of the above answers are "NO", then may proceed with Cephalosporin use.    VITALS:  Blood pressure 115/74, pulse 80, temperature 97.7 F (36.5 C), temperature source Oral, resp. rate (!) 21, height 6' (1.829 m), weight 74.6 kg, SpO2 94 %.  PHYSICAL EXAMINATION:  Constitutional: Appears well-developed and well-nourished. No distress. HENT: Normocephalic. [place] Oropharynx is clear and  moist.  Eyes: Conjunctivae and EOM are normal. PERRLA, no scleral icterus.  Neck: Normal ROM. Neck supple. No JVD. No tracheal deviation. CVS: RRR, S1/S2 +, 2/6 murmurs, no gallops, no carotid bruit.  Pulmonary: Effort and breath sounds normal, no stridor, rhonchi, wheezes, rales.  Abdominal: Soft. BS +,  no distension, tenderness, rebound or guarding.  Musculoskeletal: Normal range of motion. No edema and no tenderness.  Neuro: Alert. CN 2-12 grossly intact. No focal deficits. Skin: Skin is warm and dry. No rash noted. Psychiatric: Normal mood and affect.      LABORATORY PANEL:   CBC Recent Labs  Lab 06/30/18 0949  WBC 3.2*  HGB 9.6*  HCT 30.2*  PLT 81*   ------------------------------------------------------------------------------------------------------------------  Chemistries  Recent Labs  Lab 06/30/18 0949  NA 135  K 4.4  CL 105  CO2 25  GLUCOSE 181*  BUN 22  CREATININE 0.77  CALCIUM 8.9   ------------------------------------------------------------------------------------------------------------------  Cardiac Enzymes Recent Labs  Lab 06/29/18 0630 06/30/18 0949  TROPONINI 0.08* 5.23*   ------------------------------------------------------------------------------------------------------------------  RADIOLOGY:  Dg Chest Portable 1 View  Result Date: 06/29/2018 CLINICAL DATA:  Acute onset of left-sided chest pain, radiating to the neck. Shortness of breath. EXAM: PORTABLE CHEST 1 VIEW COMPARISON:  Chest radiograph performed 09/01/2017 FINDINGS: The lungs are hyperexpanded, with flattening of the hemidiaphragms, compatible with COPD. Bibasilar airspace opacities, right greater than left, may reflect asymmetric pulmonary edema or pneumonia. No definite pleural effusion or pneumothorax is seen. The cardiomediastinal silhouette is borderline normal in size. No acute osseous abnormalities are seen. IMPRESSION: 1. Bibasilar airspace opacities, right greater  than left, may reflect asymmetric pulmonary edema or pneumonia. 2. Findings of COPD. Electronically Signed   By: Garald Balding M.D.   On: 06/29/2018 06:49  ASSESSMENT AND PLAN:   84 year old male with history of severe three-vessel coronary disease and moderate aortic stenosis who presents to the ER due to chest pain with worsening EKG changes in the anterior leads.  1.  Non-ST elevation MI: Patient is status post cardiac catheterization which shows heavily calcified ostial LAD and circumflex not amendable to PCI without atherectomy. Cardiology is recommending medical management Follow-up and echocardiogram to evaluate EF. Continue Ranexa 500 mg twice daily in addition to current antianginal medications. Continue heparin for total 48 hours as per cardiology. Continue Plavix, isosorbide, lisinopril, Zocor and Coreg  2.  Acute on chronic systolic heart failure: Patient given IV Lasix this morning Follow-up on today's chest x-ray and echocardiogram Continue Coreg and lisinopril   3.  Pneumonia: Start Levaquin x5 days  4 diabetes: Continue sliding scale  5.  History of MDS: Patient is followed at [facility] plans discussed with the patient and family and they are in agreement.  CODE STATUS: Full  TOTAL TIME TAKING CARE OF THIS PATIENT: 30 minutes.     POSSIBLE D/C 1 to 2 days, DEPENDING ON CLINICAL CONDITION.   Santiago Graf M.D on 06/30/2018 at 11:43 AM  Between 7am to 6pm - Pager - [phone] After 6pm go to [email or web address] - password [email or web address] [place] [facility]  Office  [phone]  CC: Primary care physician; Zolotor, Audie Clear, MD  Note: This dictation was prepared with Dragon dictation along with smaller phrase technology. Any transcriptional errors that result from this process are unintentional.

## 2018-06-30 NOTE — Progress Notes (Signed)
[place] for heparin Indication: chest pain/ACS  Allergies  Allergen Reactions  . Penicillins Swelling    Has patient had a PCN reaction causing immediate rash, facial/tongue/throat swelling, SOB or lightheadedness with hypotension: Yes Has patient had a PCN reaction causing severe rash involving mucus membranes or skin necrosis: No Has patient had a PCN reaction that required hospitalization: No Has patient had a PCN reaction occurring within the last 10 years: No If all of the above answers are "NO", then may proceed with Cephalosporin use.    Patient Measurements: Height: 6' (182.9 cm) Weight: 164 lb 7.4 oz (74.6 kg) IBW/kg (Calculated) : 77.6 Heparin Dosing Weight: 74.6 kg  Vital Signs: Temp: 97.7 F (36.5 C) (11/18 0800) Temp Source: Oral (11/18 0800) BP: 115/74 (11/18 1000) Pulse Rate: 80 (11/18 1000)  Labs: Recent Labs    06/29/18 0630 06/29/18 0704 06/30/18 0011 06/30/18 0949  HGB 10.0*  --   --  9.6*  HCT 31.9*  --   --  30.2*  PLT 82*  --   --  81*  APTT  --  >160*  --   --   LABPROT  --  17.7*  --   --   INR  --  1.47  --   --   HEPARINUNFRC  --   --  <0.10* 0.14*  CREATININE 0.99  --   --  0.77  TROPONINI 0.08*  --   --  5.23*    Estimated Creatinine Clearance: 63.5 mL/min (by C-G formula based on SCr of 0.77 mg/dL).   Medical History: Past Medical History:  Diagnosis Date  . Aortic stenosis   . CAD (coronary artery disease)    a. NSTEMI 1/19 - LHC 1/19: ostial LAD 75%, proximal LAD 80%, ostial LCx 75%, mid LCX 75%, OM2 80%, ostial RCA 99%, mid RCA 95%, distal RCA 99%, med Rx  . Diabetes mellitus without complication ([place])   . Hypertension   . Ischemic cardiomyopathy    a. TTE 1/19: EF of 30-35%, hypokinesis of the anterior, anteroseptal, and apical myocardium, Gr1DD, moderate aortic stenosis (may be under estimated due to his cardiomyopathy), RV systolic function normal, left atrium normal size, unable to  estimate PASP  . Myelodysplastic syndrome [facility])     Assessment: 84 year old male admitted with chest pain. Patient s/p cardiac catheterization. Recommendation from Cardiology to use heparin for 48 hours.  Heparin infusing at 1050 units/hr  11/18 ~10:00 HL 0.14  Goal of Therapy:  Heparin level 0.3-0.7 units/ml Monitor platelets by anticoagulation protocol: Yes   Plan:  HL is subtherapeutic. Will order Heparin 2200 units IV bolus and increase infusion rate to 1300 units/hr. Recheck heparin level in 8 hours. CBC with AM labs per protocol.   Paulina Fusi, PharmD, BCPS 06/30/2018 11:53 AM

## 2018-06-30 NOTE — Progress Notes (Signed)
Pt c/o 6/10 CP, nitro given and Md paged. Pt remains on IV heparin gtt. No additional orders at this time. Will continue to monitor.

## 2018-06-30 NOTE — Progress Notes (Signed)
Progress Note  Patient Name: Terry Gonzales Date of Encounter: 06/30/2018  Primary Cardiologist: Dr. Rockey Situ  Subjective   The patient reports having chest pain all day long yesterday.  He also started having productive cough and he reports worsening shortness of breath.  Inpatient Medications    Scheduled Meds: . aspirin EC  81 mg Oral Daily  . carvedilol  3.125 mg Oral BID WC  . Chlorhexidine Gluconate Cloth  6 each Topical Q0600  . clopidogrel  75 mg Oral Daily  . [START ON 07/13/2018] Darbepoetin Alfa  500 mcg Subcutaneous Q30 days  . fluticasone  2 spray Each Nare Daily  . furosemide  40 mg Intravenous Once  . insulin aspart  0-9 Units Subcutaneous TID WC  . isosorbide mononitrate  30 mg Oral BID  . lisinopril  10 mg Oral Daily  . multivitamin with minerals  1 tablet Oral Daily  . mupirocin ointment  1 application Nasal BID  . ranolazine  500 mg Oral BID  . simvastatin  10 mg Oral q1800  . sodium chloride flush  3 mL Intravenous Q12H   Continuous Infusions: . sodium chloride    . heparin 1,050 Units/hr (06/30/18 0700)   PRN Meds: sodium chloride, acetaminophen, albuterol, alum & mag hydroxide-simeth, bisacodyl, docusate sodium, morphine injection, nitroGLYCERIN, ondansetron (ZOFRAN) IV, senna-docusate, sodium chloride flush   Vital Signs    Vitals:   06/30/18 0500 06/30/18 0518 06/30/18 0600 06/30/18 0700  BP: 115/64  (!) 110/57 119/70  Pulse: 66 72 64 74  Resp: 11 16 19  (!) 22  Temp:      TempSrc:      SpO2: 95% 97% 97% 95%  Weight:      Height:        Intake/Output Summary (Last 24 hours) at 06/30/2018 0840 Last data filed at 06/30/2018 0700 Gross per 24 hour  Intake 1049.68 ml  Output 925 ml  Net 124.68 ml   Filed Weights   06/29/18 0642 06/29/18 0833  Weight: 72.6 kg 74.6 kg    Telemetry    Normal sinus rhythm with intermittent sinus tachycardia- Personally Reviewed  ECG    Not performed today- Personally Reviewed  Physical Exam    GEN: No acute distress.   Neck: No JVD Cardiac: RRR, no  rubs, or gallops.  2 out of 6 crescendo decrescendo systolic murmur in the aortic area which is mid peaking Respiratory:  Significantly diminished breath sounds bilaterally especially at the base with few crackles. GI: Soft, nontender, non-distended  MS: No edema; No deformity. Neuro:  Nonfocal  Psych: Normal affect  No groin hematoma  Labs    Chemistry Recent Labs  Lab 06/29/18 0630  NA 141  K 4.6  CL 110  CO2 22  GLUCOSE 164*  BUN 23  CREATININE 0.99  CALCIUM 9.7  GFRNONAA >60  GFRAA >60  ANIONGAP 9     Hematology Recent Labs  Lab 06/29/18 0630  WBC 3.0*  RBC 2.76*  HGB 10.0*  HCT 31.9*  MCV 115.6*  MCH 36.2*  MCHC 31.3  RDW 15.5  PLT 82*    Cardiac Enzymes Recent Labs  Lab 06/29/18 0630  TROPONINI 0.08*   No results for input(s): TROPIPOC in the last 168 hours.   BNPNo results for input(s): BNP, PROBNP in the last 168 hours.   DDimer No results for input(s): DDIMER in the last 168 hours.   Radiology    Dg Chest Portable 1 View  Result Date: 06/29/2018 CLINICAL DATA:  Acute onset of left-sided chest pain, radiating to the neck. Shortness of breath. EXAM: PORTABLE CHEST 1 VIEW COMPARISON:  Chest radiograph performed 09/01/2017 FINDINGS: The lungs are hyperexpanded, with flattening of the hemidiaphragms, compatible with COPD. Bibasilar airspace opacities, right greater than left, may reflect asymmetric pulmonary edema or pneumonia. No definite pleural effusion or pneumothorax is seen. The cardiomediastinal silhouette is borderline normal in size. No acute osseous abnormalities are seen. IMPRESSION: 1. Bibasilar airspace opacities, right greater than left, may reflect asymmetric pulmonary edema or pneumonia. 2. Findings of COPD. Electronically Signed   By: Garald Balding M.D.   On: 06/29/2018 06:49    Cardiac Studies   Cardiac catheterization done by me yesterday:  1.  Severe heavily  calcified three-vessel coronary artery disease with moderate left main stenosis.  Ostial left circumflex stenosis appears to be worse than January.  The RCA is chronically occluded with collaterals from the left circumflex. 2.  Moderately elevated left ventricular end-diastolic pressure at 22 mmHg.  Left ventricular angiography was not performed. 3.  Mild aortic stenosis by gradient with less than 10 mmHg peak to peak gradient.  Recommendations: Unfortunately, the patient has heavily calcified ostial LAD and left circumflex disease which is not amenable for PCI without atherectomy.  Atherectomy will have to be done with mechanical support given his low ejection fraction and I do not think he is a good candidate for that due to age and comorbidities including MDS with thrombocytopenia.  He is not a candidate for CABG. Recommend continuing medical therapy. Use heparin drip for 48 hours.  Patient Profile     84 y.o. male with a hx of severe three-vessel coronary artery disease and moderate aortic stenosis who presented yesterday with symptoms of unstable angina with worsening EKG changes in the anterior leads.  Assessment & Plan    1.  Non-ST elevation myocardial infarction: I am going to recheck troponin this morning.  If troponin significantly elevated, the plan is to continue unfractionated heparin for another day.  He is already on dual antiplatelet therapy with aspirin and Plavix. Unfortunately, he has heavily calcified disease not amenable to PCI as outlined in my cath recommendations. I am going to obtain an echocardiogram to evaluate his ejection fraction. I elected to add Ranexa 500 mg twice daily in addition to his antianginal medications. This is going to be a difficult case to manage in the future if he continues to have significant anginal symptoms.  2.  Acute on chronic systolic heart failure: The patient complains of worsening dyspnea and he appears to be volume overloaded.  I am  going to give him 1 dose of IV furosemide now.  Check basic metabolic profile and BNP.  I ordered an echocardiogram.  We will continue carvedilol and lisinopril.  3.  Aortic stenosis: Does not appear to be severe by physical exam or by cath.  Likely in the mild-to-moderate range.  4.  Possible bronchitis or early pneumonia: Patient complains of productive cough which started yesterday.  Will consider antibiotics.  I discussed the plan with patient and family at bedside.   For questions or updates, please contact [place] Please consult [email or web address] for contact info under        Signed, Kathlyn Sacramento, MD  06/30/2018, 8:40 AM

## 2018-06-30 NOTE — Progress Notes (Addendum)
[place] for heparin Indication: chest pain/ACS  Allergies  Allergen Reactions  . Penicillins Swelling    Has patient had a PCN reaction causing immediate rash, facial/tongue/throat swelling, SOB or lightheadedness with hypotension: Yes Has patient had a PCN reaction causing severe rash involving mucus membranes or skin necrosis: No Has patient had a PCN reaction that required hospitalization: No Has patient had a PCN reaction occurring within the last 10 years: No If all of the above answers are "NO", then may proceed with Cephalosporin use.    Patient Measurements: Height: 6' (182.9 cm) Weight: 164 lb 7.4 oz (74.6 kg) IBW/kg (Calculated) : 77.6 Heparin Dosing Weight: 74.6 kg  Vital Signs: Temp: 99.2 F (37.3 C) (11/18 0000) Temp Source: Oral (11/18 0000) BP: 113/69 (11/18 0100) Pulse Rate: 72 (11/18 0100)  Labs: Recent Labs    06/29/18 0630 06/29/18 0704  HGB 10.0*  --   HCT 31.9*  --   PLT 82*  --   APTT  --  >160*  LABPROT  --  17.7*  INR  --  1.47  CREATININE 0.99  --   TROPONINI 0.08*  --     Estimated Creatinine Clearance: 51.3 mL/min (by C-G formula based on SCr of 0.99 mg/dL).   Medical History: Past Medical History:  Diagnosis Date  . Aortic stenosis   . CAD (coronary artery disease)    a. NSTEMI 1/19 - LHC 1/19: ostial LAD 75%, proximal LAD 80%, ostial LCx 75%, mid LCX 75%, OM2 80%, ostial RCA 99%, mid RCA 95%, distal RCA 99%, med Rx  . Diabetes mellitus without complication ([place])   . Hypertension   . Ischemic cardiomyopathy    a. TTE 1/19: EF of 30-35%, hypokinesis of the anterior, anteroseptal, and apical myocardium, Gr1DD, moderate aortic stenosis (may be under estimated due to his cardiomyopathy), RV systolic function normal, left atrium normal size, unable to estimate PASP  . Myelodysplastic syndrome [facility])     Assessment: 84 year old male admitted with chest pain. Patient s/p cardiac catheterization.  Recommendation from Cardiology to use heparin for 48 hours.  Goal of Therapy:  Heparin level 0.3-0.7 units/ml Monitor platelets by anticoagulation protocol: Yes   Plan:  11/18 0030 HL <0.10. Nurse confirms drip has been running without any interruptions. Will order 2200 unit bolus and increase infusion to 1050 units/hr. Recheck heparin level in 8 hours. CBC with AM labs per protocol.   Pernell Dupre, PharmD, BCPS Clinical Pharmacist 06/30/2018 1:58 AM

## 2018-06-30 NOTE — Care Management Note (Signed)
Case Management Note  Patient Details  Name: Terry Gonzales MRN: [id number] Date of Birth: [birth year]  Subjective/Objective:  Patient admitted with NSTEMI, currently being medically managed in ICU.  Patient is from home with wife.  Wife and daughter are at the bedside.  Patient reports that he and his wife have been married for 70 years.  PCP verified as Dr. Rivka Barbara with [facility], pt follows up every 3 months, next appointment in December.  Patient followed by Dr. Rockey Situ with Cardiology in [place].  Pharmacy is [facility], no issues with obtaining prescriptions.  Patient reports his current blood thinner at home is Plavix- has been taking for a year now.  Patient reports that he uses a cane all the time at home and a walker when he feels he needs to.  Patient reports that he has had HHPT in the past and does not want to have it again.  He reports he does not think he will need HHPT.  RNCM will follow as needed for discharged planning.  No needs identified at this time.  Doran Clay RN BSN [phone]                     Action/Plan:   Expected Discharge Date:                  Expected Discharge Plan:  Home/Self Care  In-House Referral:     Discharge planning Services  CM Consult  Post Acute Care Choice:    Choice offered to:     DME Arranged:    DME Agency:     HH Arranged:    HH Agency:     Status of Service:  In process, will continue to follow  If discussed at Long Length of Stay Meetings, dates discussed:    Additional Comments:  Shelbie Hutching, RN 06/30/2018, 3:31 PM

## 2018-06-30 NOTE — Progress Notes (Signed)
Pt c/o severe itching in arm that Levofloxacin is infusing in. ABX stopped and IV line flushed. Pts vein in the affected arm was red and raised. No reporting no SOB or difficulty swallowing or breathing at this time.

## 2018-06-30 NOTE — Progress Notes (Signed)
Pharmacy Antibiotic Note  Terry Gonzales is a 84 y.o. male admitted on 06/29/2018 with pneumonia.  Pharmacy has been consulted for Levaquin dosing.  Plan: Levaquin 750mg  IV q24h x 5 days  Height: 6' (182.9 cm) Weight: 164 lb 7.4 oz (74.6 kg) IBW/kg (Calculated) : 77.6  Temp (24hrs), Avg:97.9 F (36.6 C), Min:97.5 F (36.4 C), Max:99.2 F (37.3 C)  Recent Labs  Lab 06/29/18 0630 06/30/18 0949  WBC 3.0* 3.2*  CREATININE 0.99 0.77    Estimated Creatinine Clearance: 63.5 mL/min (by C-G formula based on SCr of 0.77 mg/dL).    Allergies  Allergen Reactions  . Penicillins Swelling    Has patient had a PCN reaction causing immediate rash, facial/tongue/throat swelling, SOB or lightheadedness with hypotension: Yes Has patient had a PCN reaction causing severe rash involving mucus membranes or skin necrosis: No Has patient had a PCN reaction that required hospitalization: No Has patient had a PCN reaction occurring within the last 10 years: No If all of the above answers are "NO", then may proceed with Cephalosporin use.    Antimicrobials this admission: Levaquin 11/18 >>    Thank you for allowing pharmacy to be a part of this patient's care.  Paulina Fusi, PharmD, BCPS 06/30/2018 11:16 AM

## 2018-06-30 NOTE — Progress Notes (Signed)
CRITICAL VALUE ALERT  Critical Value:  Troponin 5.23  Date & Time Notied:  11:38 AM   Provider Notified: Arida  Orders Received/Actions taken: waiting

## 2018-07-01 ENCOUNTER — Inpatient Hospital Stay: Payer: Medicare Other

## 2018-07-01 ENCOUNTER — Inpatient Hospital Stay (HOSPITAL_COMMUNITY)
Admit: 2018-07-01 | Discharge: 2018-07-01 | Disposition: A | Discharge status: Home / Self Care | Payer: Medicare Other | Attending: Cardiovascular Disease | Admitting: Cardiovascular Disease

## 2018-07-01 DIAGNOSIS — I350 Nonrheumatic aortic (valve) stenosis: Secondary | ICD-10-CM

## 2018-07-01 DIAGNOSIS — D469 Myelodysplastic syndrome, unspecified: Secondary | ICD-10-CM

## 2018-07-01 DIAGNOSIS — I255 Ischemic cardiomyopathy: Secondary | ICD-10-CM

## 2018-07-01 DIAGNOSIS — I340 Nonrheumatic mitral (valve) insufficiency: Secondary | ICD-10-CM

## 2018-07-01 DIAGNOSIS — I5023 Acute on chronic systolic (congestive) heart failure: Secondary | ICD-10-CM

## 2018-07-01 LAB — CBC WITH DIFFERENTIAL/PLATELET
ABS IMMATURE GRANULOCYTES: 0.1 10*3/uL — AB (ref 0.00–0.07)
BASOS PCT: 0 %
Basophils Absolute: 0 10*3/uL (ref 0.0–0.1)
Eosinophils Absolute: 0 10*3/uL (ref 0.0–0.5)
Eosinophils Relative: 1 %
HCT: 27.3 % — ABNORMAL LOW (ref 39.0–52.0)
HEMOGLOBIN: 8.8 g/dL — AB (ref 13.0–17.0)
IMMATURE GRANULOCYTES: 3 %
LYMPHS ABS: 1 10*3/uL (ref 0.7–4.0)
LYMPHS PCT: 30 %
MCH: 36.7 pg — ABNORMAL HIGH (ref 26.0–34.0)
MCHC: 32.2 g/dL (ref 30.0–36.0)
MCV: 113.8 fL — ABNORMAL HIGH (ref 80.0–100.0)
MONO ABS: 0.8 10*3/uL (ref 0.1–1.0)
MONOS PCT: 24 %
NEUTROS ABS: 1.4 10*3/uL — AB (ref 1.7–7.7)
NEUTROS PCT: 42 %
PLATELETS: 78 10*3/uL — AB (ref 150–400)
RBC: 2.4 MIL/uL — ABNORMAL LOW (ref 4.22–5.81)
RDW: 15 % (ref 11.5–15.5)
WBC: 3.3 10*3/uL — ABNORMAL LOW (ref 4.0–10.5)
nRBC: 0 % (ref 0.0–0.2)

## 2018-07-01 LAB — ECHOCARDIOGRAM COMPLETE
Height: 72 in
Weight: 2631.41 oz

## 2018-07-01 LAB — TROPONIN I: Troponin I: 2.26 ng/mL (ref <0.03)

## 2018-07-01 LAB — BASIC METABOLIC PANEL
Anion gap: 13 (ref 5–15)
BUN: 25 mg/dL — AB (ref 8–23)
CO2: 22 mmol/L (ref 22–32)
Calcium: 9.3 mg/dL (ref 8.9–10.3)
Chloride: 101 mmol/L (ref 98–111)
Creatinine, Ser: 1 mg/dL (ref 0.61–1.24)
GFR calc Af Amer: >60 mL/min (ref >60)
GLUCOSE: 139 mg/dL — AB (ref 70–99)
POTASSIUM: 4.3 mmol/L (ref 3.5–5.1)
Sodium: 136 mmol/L (ref 135–145)

## 2018-07-01 LAB — GLUCOSE, CAPILLARY
GLUCOSE-CAPILLARY: 128 mg/dL — AB (ref 70–99)
GLUCOSE-CAPILLARY: 140 mg/dL — AB (ref 70–99)
Glucose-Capillary: 112 mg/dL — ABNORMAL HIGH (ref 70–99)
Glucose-Capillary: 161 mg/dL — ABNORMAL HIGH (ref 70–99)

## 2018-07-01 LAB — HEPARIN LEVEL (UNFRACTIONATED)
HEPARIN UNFRACTIONATED: 0.32 [IU]/mL (ref 0.30–0.70)
HEPARIN UNFRACTIONATED: 0.14 [IU]/mL — AB (ref 0.30–0.70)
Heparin Unfractionated: 0.42 IU/mL (ref 0.30–0.70)

## 2018-07-01 LAB — PHOSPHORUS: PHOSPHORUS: 3.6 mg/dL (ref 2.5–4.6)

## 2018-07-01 LAB — PROCALCITONIN: Procalcitonin: <0.1 ng/mL

## 2018-07-01 LAB — MAGNESIUM: MAGNESIUM: 2.1 mg/dL (ref 1.7–2.4)

## 2018-07-01 MED ORDER — FUROSEMIDE 10 MG/ML IJ SOLN
20.0000 mg | Freq: Every day | INTRAMUSCULAR | Status: DC
  Administered 2018-07-01 – 2018-07-02 (×2): 20 mg via INTRAVENOUS
  Filled 2018-07-01 (×2): qty 2

## 2018-07-01 MED ORDER — ISOSORBIDE MONONITRATE ER 30 MG PO TB24
15.0000 mg | ORAL_TABLET | Freq: Once | ORAL | Status: AC
  Administered 2018-07-01: 15 mg via ORAL
  Filled 2018-07-01: qty 1

## 2018-07-01 MED ORDER — ISOSORBIDE MONONITRATE ER 60 MG PO TB24
60.0000 mg | ORAL_TABLET | Freq: Every day | ORAL | Status: DC
  Administered 2018-07-02 – 2018-07-06 (×5): 60 mg via ORAL
  Filled 2018-07-01: qty 1
  Filled 2018-07-01: qty 2
  Filled 2018-07-01 (×3): qty 1

## 2018-07-01 MED ORDER — HEPARIN BOLUS VIA INFUSION
2200.0000 [IU] | Freq: Once | INTRAVENOUS | Status: AC
  Administered 2018-07-01: 2200 [IU] via INTRAVENOUS
  Filled 2018-07-01: qty 2200

## 2018-07-01 MED ORDER — POLYETHYLENE GLYCOL 3350 17 G PO PACK
17.0000 g | PACK | Freq: Every day | ORAL | Status: DC
  Administered 2018-07-01 – 2018-07-06 (×5): 17 g via ORAL
  Filled 2018-07-01 (×5): qty 1

## 2018-07-01 MED ORDER — GUAIFENESIN ER 600 MG PO TB12
600.0000 mg | ORAL_TABLET | Freq: Two times a day (BID) | ORAL | Status: DC
  Administered 2018-07-01 – 2018-07-06 (×11): 600 mg via ORAL
  Filled 2018-07-01 (×12): qty 1

## 2018-07-01 MED ORDER — DOCUSATE SODIUM 100 MG PO CAPS
100.0000 mg | ORAL_CAPSULE | Freq: Every day | ORAL | Status: DC
  Administered 2018-07-02 – 2018-07-06 (×4): 100 mg via ORAL
  Filled 2018-07-01 (×4): qty 1

## 2018-07-01 MED ORDER — FUROSEMIDE 20 MG PO TABS
20.0000 mg | ORAL_TABLET | Freq: Every day | ORAL | Status: DC
  Administered 2018-07-01: 20 mg via ORAL
  Filled 2018-07-01: qty 1

## 2018-07-01 NOTE — Progress Notes (Signed)
*  PRELIMINARY RESULTS* Echocardiogram 2D Echocardiogram has been performed.  Terry Gonzales 07/01/2018, 9:43 AM

## 2018-07-01 NOTE — Progress Notes (Addendum)
Progress Note  Patient Name: Terry Gonzales Date of Encounter: 07/01/2018  Primary Cardiologist: Rockey Situ  Subjective   No further chest pain overnight, though patient had 10/10 chest pain this morning while trying to use the bedside commode.  Pain resolved after about 10 minutes with sublingual nitroglycerin and IV morphine.  No shortness of breath.  Inpatient Medications    Scheduled Meds: . aspirin EC  81 mg Oral Daily  . carvedilol  3.125 mg Oral BID WC  . Chlorhexidine Gluconate Cloth  6 each Topical Q0600  . clopidogrel  75 mg Oral Daily  . [START ON 07/13/2018] Darbepoetin Alfa  500 mcg Subcutaneous Q30 days  . fluticasone  2 spray Each Nare Daily  . insulin aspart  0-9 Units Subcutaneous TID WC  . isosorbide mononitrate  30 mg Oral BID  . mouth rinse  15 mL Mouth Rinse BID  . multivitamin with minerals  1 tablet Oral Daily  . mupirocin ointment  1 application Nasal BID  . ranolazine  500 mg Oral BID  . simvastatin  10 mg Oral q1800  . sodium chloride flush  3 mL Intravenous Q12H   Continuous Infusions: . sodium chloride    . ceFEPime (MAXIPIME) IV 2 g (07/01/18 1055)  . heparin 1,500 Units/hr (07/01/18 1225)  . vancomycin Stopped (07/01/18 0353)   PRN Meds: sodium chloride, acetaminophen, albuterol, alum & mag hydroxide-simeth, bisacodyl, docusate sodium, morphine injection, nitroGLYCERIN, ondansetron (ZOFRAN) IV, senna-docusate, sodium chloride flush   Vital Signs    Vitals:   07/01/18 0100 07/01/18 0200 07/01/18 0300 07/01/18 0400  BP: (!) 103/59 (!) 100/57 (!) 103/54 108/67  Pulse: 80 74 71 77  Resp: 19 14 16 16   Temp:    (!) 97.5 F (36.4 C)  TempSrc:    Oral  SpO2: 91% 97% 95% 97%  Weight:      Height:        Intake/Output Summary (Last 24 hours) at 07/01/2018 1246 Last data filed at 07/01/2018 0400 Gross per 24 hour  Intake 1254.37 ml  Output 1400 ml  Net -145.63 ml   Filed Weights   06/29/18 0642 06/29/18 0833  Weight: 72.6 kg 74.6 kg     Telemetry    Normal sinus rhythm with PACs and 2 episodes of nonsustained ventricular tachycardia (up to 6 beats). - Personally Reviewed  ECG    No new tracing.  Physical Exam   GEN:  Seated comfortably in bedside recliner. Neck: No JVD Cardiac:  Regular rate and rhythm with 2/6 systolic murmur.  No rubs or gallops. Respiratory:  Basilar crackles. GI: Soft, nontender, non-distended  MS: No edema; No deformity.  Right femoral arteriotomy site is soft without hematoma.  It is covered with a clean dressing. Neuro:  Nonfocal  Psych: Normal affect   Labs    Chemistry Recent Labs  Lab 06/29/18 0630 06/30/18 0949 07/01/18 0248  NA 141 135 136  K 4.6 4.4 4.3  CL 110 105 101  CO2 22 25 22   GLUCOSE 164* 181* 139*  BUN 23 22 25*  CREATININE 0.99 0.77 1.00  CALCIUM 9.7 8.9 9.3  GFRNONAA >60 >60 >60  GFRAA >60 >60 >60  ANIONGAP 9 5 13      Hematology Recent Labs  Lab 06/29/18 0630 06/30/18 0949 07/01/18 0248  WBC 3.0* 3.2* 3.3*  RBC 2.76* 2.62* 2.40*  HGB 10.0* 9.6* 8.8*  HCT 31.9* 30.2* 27.3*  MCV 115.6* 115.3* 113.8*  MCH 36.2* 36.6* 36.7*  MCHC 31.3 31.8 32.2  RDW 15.5 15.2 15.0  PLT 82* 81* 78*    Cardiac Enzymes Recent Labs  Lab 06/29/18 0630 06/30/18 0949 07/01/18 1109  TROPONINI 0.08* 5.23* 2.26*   No results for input(s): TROPIPOC in the last 168 hours.   BNP Recent Labs  Lab 06/30/18 0012  BNP 1,420.0*     DDimer No results for input(s): DDIMER in the last 168 hours.   Radiology    Dg Chest Port 1 View  Result Date: 07/01/2018 CLINICAL DATA:  Pneumonia. EXAM: PORTABLE CHEST 1 VIEW COMPARISON:  Radiographs of June 29, 2018. FINDINGS: Stable cardiomegaly. Bilateral perihilar and basilar opacities are noted concerning for pulmonary edema with increased bilateral pleural effusions. No pneumothorax is noted. Bony thorax is unremarkable. IMPRESSION: Bilateral perihilar and basilar opacities are noted concerning for pulmonary edema with  increased bilateral pleural effusions. Electronically Signed   By: Marijo Conception, M.D.   On: 07/01/2018 07:23    Cardiac Studies   LHC (06/29/18): 1.  Severe heavily calcified three-vessel coronary artery disease with moderate left main stenosis.  Ostial left circumflex stenosis appears to be worse than January.  The RCA is chronically occluded with collaterals from the left circumflex. 2.  Moderately elevated left ventricular Avagail Whittlesey-diastolic pressure at 22 mmHg.  Left ventricular angiography was not performed. 3.  Mild aortic stenosis by gradient with less than 10 mmHg peak to peak gradient.  Diagnostic Diagram        Patient Profile     84 y.o. male with history of multivessel CAD, ischemic cardiomyopathy, and aortic stenosis admitted with high risk NSTEMI.  Assessment & Plan    NSTEMI Patient continues to have intermittent chest pain, including with minimal activity this morning.  Fortunately, pain resolved with sublingual nitroglycerin and IV morphine.  I have personally reviewed his catheterization yesterday.  I agree that diffuse disease and heavy calcification of all 3 coronary arteries limits both surgical and percutaneous revascularization options.  Give additional isosorbide mononitrate 15 mg x 1 now and change dosing from 30 mg BID to 60 mg daily.  Further escalation may be necessary based on response.  I will hold lisinopril to allow for more blood pressure to facilitate escalation of antianginal therapy.  Continue ranolazine 500 mg twice daily and carvedilol 3.125 mg twice daily.  Continue IV heparin for 48 hours.  Dual antiplatelet therapy with aspirin and clopidogrel.  High intensity statin therapy.  Palliative care consultation for goals of care and assistance with pain management might be helpful.  Repeat EKG pending.  Ischemic cardiomyopathy and acute on chronic systolic heart failure LVEDP mildly elevated at the time of catheterization yesterday.  Faint  crackles noted in both lung bases on exam today.  Repeat echo pending.  Follow-up echocardiogram.  Gentle diuresis; will start furosemide 20 mg PO daily.  Continue carvedilol 3.125 mg BID.  Hold lisinopril in the setting of borderline low blood pressure and desire to escalate antianginal therapy.  Aortic stenosis Thought to be moderate on most recent echo.  Follow-up repeat echo.  Myelodysplastic syndrome Continue close montoring of hemoglobin and platelets.  If platelets fall below 50,000, heparin +/- antiplatelet therapy may need to be discontinued.  If hemoglobin falls further and patient continues to have chest pain, PRBC transfusion is recommended.  For questions or updates, please contact Paulina Please consult [email or web address] for contact info under [facility] Cardiology.     Signed, Nelva Bush, MD  07/01/2018, 12:46 PM

## 2018-07-01 NOTE — Progress Notes (Signed)
[place] for heparin Indication: chest pain/ACS  Allergies  Allergen Reactions  . Penicillins Swelling    Has patient had a PCN reaction causing immediate rash, facial/tongue/throat swelling, SOB or lightheadedness with hypotension: Yes Has patient had a PCN reaction causing severe rash involving mucus membranes or skin necrosis: No Has patient had a PCN reaction that required hospitalization: No Has patient had a PCN reaction occurring within the last 10 years: No If all of the above answers are "NO", then may proceed with Cephalosporin use.  [name] [Levofloxacin] Itching    Patient Measurements: Height: 6' (182.9 cm) Weight: 164 lb 7.4 oz (74.6 kg) IBW/kg (Calculated) : 77.6 Heparin Dosing Weight: 74.6 kg  Vital Signs: Temp: 97.8 F (36.6 C) (11/19 0000) Temp Source: Oral (11/19 0000) BP: 100/57 (11/19 0200) Pulse Rate: 74 (11/19 0200)  Labs: Recent Labs    06/29/18 0630 06/29/18 0704  06/30/18 0949 06/30/18 1855 07/01/18 0248  HGB 10.0*  --   --  9.6*  --   --   HCT 31.9*  --   --  30.2*  --   --   PLT 82*  --   --  81*  --   --   APTT  --  >160*  --   --   --   --   LABPROT  --  17.7*  --   --   --   --   INR  --  1.47  --   --   --   --   HEPARINUNFRC  --   --    < > 0.14* 0.31 0.32  CREATININE 0.99  --   --  0.77  --   --   TROPONINI 0.08*  --   --  5.23*  --   --    < > = values in this interval not displayed.    Estimated Creatinine Clearance: 63.5 mL/min (by C-G formula based on SCr of 0.77 mg/dL).   Medical History: Past Medical History:  Diagnosis Date  . Aortic stenosis   . CAD (coronary artery disease)    a. NSTEMI 1/19 - LHC 1/19: ostial LAD 75%, proximal LAD 80%, ostial LCx 75%, mid LCX 75%, OM2 80%, ostial RCA 99%, mid RCA 95%, distal RCA 99%, med Rx  . Diabetes mellitus without complication ([place])   . Hypertension   . Ischemic cardiomyopathy    a. TTE 1/19: EF of 30-35%, hypokinesis of the anterior,  anteroseptal, and apical myocardium, Gr1DD, moderate aortic stenosis (may be under estimated due to his cardiomyopathy), RV systolic function normal, left atrium normal size, unable to estimate PASP  . Myelodysplastic syndrome [facility])     Assessment: 84 year old male admitted with chest pain. Patient s/p cardiac catheterization. Recommendation from Cardiology to use heparin for 48 hours.  Heparin infusing at 1050 units/hr  11/18 ~10:00 HL 0.14 11/18 1855 Heparin level - 0.31  Goal of Therapy:  Heparin level 0.3-0.7 units/ml Monitor platelets by anticoagulation protocol: Yes   Plan:  11/19 @ 0300 HL 0.32 therapeutic; however, patient has been on low end for 2 levels. Will continue current rate and will recheck another level @ 1100 to ensure patient does not fall below range. trops continue to rise 0.08 >> 5.23 will continue to monitor.  Tobie Lords, PharmD, BCPS Clinical Pharmacist 07/01/2018

## 2018-07-01 NOTE — Progress Notes (Signed)
[place] for heparin Indication: chest pain/ACS  Allergies  Allergen Reactions  . Penicillins Swelling    Has patient had a PCN reaction causing immediate rash, facial/tongue/throat swelling, SOB or lightheadedness with hypotension: Yes Has patient had a PCN reaction causing severe rash involving mucus membranes or skin necrosis: No Has patient had a PCN reaction that required hospitalization: No Has patient had a PCN reaction occurring within the last 10 years: No If all of the above answers are "NO", then may proceed with Cephalosporin use.  [name] [Levofloxacin] Itching    Patient Measurements: Height: 6' (182.9 cm) Weight: 164 lb 7.4 oz (74.6 kg) IBW/kg (Calculated) : 77.6 Heparin Dosing Weight: 74.6 kg  Vital Signs: Temp: 97.5 F (36.4 C) (11/19 0400) Temp Source: Oral (11/19 0400) BP: 108/67 (11/19 0400) Pulse Rate: 77 (11/19 0400)  Labs: Recent Labs    06/29/18 0630 06/29/18 0704  06/30/18 0949 06/30/18 1855 07/01/18 0248 07/01/18 1109  HGB 10.0*  --   --  9.6*  --  8.8*  --   HCT 31.9*  --   --  30.2*  --  27.3*  --   PLT 82*  --   --  81*  --  78*  --   APTT  --  >160*  --   --   --   --   --   LABPROT  --  17.7*  --   --   --   --   --   INR  --  1.47  --   --   --   --   --   HEPARINUNFRC  --   --    < > 0.14* 0.31 0.32 0.14*  CREATININE 0.99  --   --  0.77  --  1.00  --   TROPONINI 0.08*  --   --  5.23*  --   --  2.26*   < > = values in this interval not displayed.    Estimated Creatinine Clearance: 50.8 mL/min (by C-G formula based on SCr of 1 mg/dL).   Medical History: Past Medical History:  Diagnosis Date  . Aortic stenosis   . CAD (coronary artery disease)    a. NSTEMI 1/19 - LHC 1/19: ostial LAD 75%, proximal LAD 80%, ostial LCx 75%, mid LCX 75%, OM2 80%, ostial RCA 99%, mid RCA 95%, distal RCA 99%, med Rx  . Diabetes mellitus without complication ([place])   . Hypertension   . Ischemic cardiomyopathy     a. TTE 1/19: EF of 30-35%, hypokinesis of the anterior, anteroseptal, and apical myocardium, Gr1DD, moderate aortic stenosis (may be under estimated due to his cardiomyopathy), RV systolic function normal, left atrium normal size, unable to estimate PASP  . Myelodysplastic syndrome [facility])     Assessment: 84 year old male admitted with chest pain. Patient s/p cardiac catheterization. Recommendation from Cardiology to use heparin for 48 hours.  Heparin infusing at 1050 units/hr  11/18 ~10:00 HL 0.14 11/18 1855 Heparin level - 0.31  Goal of Therapy:  Heparin level 0.3-0.7 units/ml Monitor platelets by anticoagulation protocol: Yes   Plan:  07/01/18: Heparin level- 0.14 at 1145. Level is subtherapeutic. Patient bolused with 2200 units and infusion rate increased to 1500 units/ hour.  Recheck heparin level in 8 hours (2030). CBC with am labs per protocol.

## 2018-07-01 NOTE — Progress Notes (Signed)
Pt assisted to [facility] and began to c/o severe 10/10CP and SOB. Pts o2 sats 88% and  o2 increased to 6L via [place], one nitro given SL and IV morphine given. Md paged, awaiting new orders.

## 2018-07-01 NOTE — Progress Notes (Signed)
Name: Terry Gonzales MRN: [id number] DOB: [birth year]     CONSULTATION DATE: 06/29/2018  Active & objectives: One episode of chest pain earlier today relieved with nitroglycerin sublingual.  PAST MEDICAL HISTORY :   has a past medical history of Aortic stenosis, CAD (coronary artery disease), Diabetes mellitus without complication ([place]), Hypertension, Ischemic cardiomyopathy, and Myelodysplastic syndrome ([place]).  has a past surgical history that includes LEFT HEART CATH AND CORONARY ANGIOGRAPHY (N/A, 08/16/2017); Coronary/Graft Acute MI Revascularization (N/A, 06/29/2018); and LEFT HEART CATH AND CORONARY ANGIOGRAPHY (N/A, 06/29/2018). Prior to Admission medications   Medication Sig Start Date End Date Taking? Authorizing Provider  albuterol (PROVENTIL HFA;VENTOLIN HFA) 108 (90 Base) MCG/ACT inhaler Inhale 2 puffs into the lungs every 6 (six) hours as needed for wheezing or shortness of breath. 08/19/17  Yes Vaughan Basta, MD  aspirin EC 81 MG EC tablet Take 1 tablet (81 mg total) by mouth daily. 08/20/17  Yes Vaughan Basta, MD  carvedilol (COREG) 3.125 MG tablet Take 1 tablet (3.125 mg total) by mouth 2 (two) times daily. 09/23/17 02/28/26 Yes Dunn, Areta Haber, PA-C  clopidogrel (PLAVIX) 75 MG tablet Take 1 tablet (75 mg total) by mouth daily. 09/05/17  Yes Gollan, Kathlene November, MD  Darbepoetin Alfa (ARANESP) 500 MCG/ML SOSY injection Inject 500 mcg into the skin every 21 ( twenty-one) days.    Yes [provider]  docusate sodium (COLACE) 100 MG capsule Take 1 capsule (100 mg total) by mouth daily as needed. Patient taking differently: Take 100 mg by mouth at bedtime.  09/04/17 09/04/18 Yes Earleen Newport, MD  fluticasone (FLONASE) 50 MCG/ACT nasal spray Place 2 sprays into the nose daily. 02/20/16 02/28/26 Yes [provider]  isosorbide mononitrate (IMDUR) 30 MG 24 hr tablet Take 1 tablet (30 mg total) by mouth 2 (two) times daily. 06/25/18  Yes Gollan, Kathlene November, MD    lisinopril (PRINIVIL,ZESTRIL) 10 MG tablet Take 1 tablet (10 mg total) by mouth daily. 01/20/18  Yes Minna Merritts, MD  Multiple Vitamin (MULTI-VITAMINS) TABS Take 0.5 tablets by mouth daily.    Yes [provider]  Multiple Vitamins-Minerals (PRESERVISION AREDS PO) Take 1 tablet by mouth 2 (two) times daily.   Yes [provider]  nitroGLYCERIN (NITROSTAT) 0.4 MG SL tablet Place 1 tablet (0.4 mg total) under the tongue every 5 (five) minutes as needed for chest pain. 06/25/18  Yes Gollan, Kathlene November, MD  senna-docusate (SENOKOT-S) 8.6-50 MG tablet Take 2 tablets by mouth daily as needed for mild constipation. 09/04/17 09/04/18 Yes Earleen Newport, MD  simvastatin (ZOCOR) 10 MG tablet Take 10 mg by mouth at bedtime.    Yes [provider]   Allergies  Allergen Reactions  . Penicillins Swelling    Has patient had a PCN reaction causing immediate rash, facial/tongue/throat swelling, SOB or lightheadedness with hypotension: Yes Has patient had a PCN reaction causing severe rash involving mucus membranes or skin necrosis: No Has patient had a PCN reaction that required hospitalization: No Has patient had a PCN reaction occurring within the last 10 years: No If all of the above answers are "NO", then may proceed with Cephalosporin use.  [name] [Levofloxacin] Itching    FAMILY HISTORY:  family history includes Hypertension in his father. SOCIAL HISTORY:  reports that he quit smoking about 49 years ago. His smoking use included cigarettes. He smoked 1.00 pack per day. He has never used smokeless tobacco. He reports that he does not drink alcohol or use drugs.  REVIEW OF SYSTEMS:   Unable to obtain due to critical illness   VITAL SIGNS: Temp:  [97.5 F (36.4 C)-97.8 F (36.6 C)] 97.5 F (36.4 C) (11/19 0400) Pulse Rate:  [64-97] 77 (11/19 0400) Resp:  [14-28] 16 (11/19 0400) BP: (92-115)/(52-73) 108/67 (11/19 0400) SpO2:  [87 %-97 %] 97 % (11/19  0400)  Physical Examination:  A & O and no focal neuro deficits On [place], no distress, able to talk in full sentences. BEAE and no rales S1 & S2 audible and no murmur Benign abdomen with normal peristalses No edema   ASSESSMENT / PLAN: Acute respiratory failure tolerating [place]. -Monitor work of breathing and O2 sat  NSTEMI / instable angina, CAD 3 vessel disease ( RCA, ostial LAD and Lt. Cx) not amenable for PCI, cardiomyopathy with LV EF 30-35% and acute on chronic CHF. -Lisinopril + Carvidalol + Heparin gtt. + Ranexa + ASA + Plavix + Statins +  Diuresis -Management as per cardiology.   Atelectasis and pneumonia. Bibasilar airspace disease with pulmonary congestion. MRSA PCR +ve however Procal < 0.10 and afebrile -d/c Vanc + c/w Cefep. Monitor CXR + CBC + FIO2 -Developed reaction to Levofloxacin (localised arm burning and itching at infusion site) -Gentle diuresis to improve lung compliance  DM -Glycemic control  HTN -Optimize antihypertensives and monitor hemodynamics.  Myelodysplastic syndrome, thrombocytopenia and anemia -Monitor Plat -Keep HB > 8 gm/dl -Monitor CBC  Full code  Supportive care  Critical care time 40 min

## 2018-07-01 NOTE — Progress Notes (Signed)
Terry Gonzales for heparin Indication: chest pain/ACS  Allergies  Allergen Reactions  . Penicillins Swelling    Has patient had a PCN reaction causing immediate rash, facial/tongue/throat swelling, SOB or lightheadedness with hypotension: Yes Has patient had a PCN reaction causing severe rash involving mucus membranes or skin necrosis: No Has patient had a PCN reaction that required hospitalization: No Has patient had a PCN reaction occurring within the last 10 years: No If all of the above answers are "NO", then may proceed with Cephalosporin use.  Terry Gonzales [Levofloxacin] Itching    Patient Measurements: Height: 6' (182.9 cm) Weight: 164 lb 7.4 oz (74.6 kg) IBW/kg (Calculated) : 77.6 Heparin Dosing Weight: 74.6 kg  Vital Signs: Temp: 98.2 F (36.8 C) (11/19 2000) Temp Source: Oral (11/19 2000) BP: 91/66 (11/19 2045) Pulse Rate: 75 (11/19 2045)  Labs: Recent Labs    06/29/18 0630 06/29/18 0704  06/30/18 0949  07/01/18 0248 07/01/18 1109 07/01/18 2023  HGB 10.0*  --   --  9.6*  --  8.8*  --   --   HCT 31.9*  --   --  30.2*  --  27.3*  --   --   PLT 82*  --   --  81*  --  78*  --   --   APTT  --  >160*  --   --   --   --   --   --   LABPROT  --  17.7*  --   --   --   --   --   --   INR  --  1.47  --   --   --   --   --   --   HEPARINUNFRC  --   --    < > 0.14*   < > 0.32 0.14* 0.42  CREATININE 0.99  --   --  0.77  --  1.00  --   --   TROPONINI 0.08*  --   --  5.23*  --   --  2.26*  --    < > = values in this interval not displayed.    Estimated Creatinine Clearance: 50.8 mL/min (by C-G formula based on SCr of 1 mg/dL).   Medical History: Past Medical History:  Diagnosis Date  . Aortic stenosis   . CAD (coronary artery disease)    a. NSTEMI 1/19 - LHC 1/19: ostial LAD 75%, proximal LAD 80%, ostial LCx 75%, mid LCX 75%, OM2 80%, ostial RCA 99%, mid RCA 95%, distal RCA 99%, med Rx  . Diabetes mellitus without complication ([place])    . Hypertension   . Ischemic cardiomyopathy    a. TTE 1/19: EF of 30-35%, hypokinesis of the anterior, anteroseptal, and apical myocardium, Gr1DD, moderate aortic stenosis (may be under estimated due to his cardiomyopathy), RV systolic function normal, left atrium normal size, unable to estimate PASP  . Myelodysplastic syndrome [facility])     Assessment: 84 year old male admitted with chest pain. Patient s/p cardiac catheterization. Recommendation from Cardiology to use heparin for 48 hours.  Heparin infusing at 1050 units/hr  11/18 ~10:00 HL 0.14 11/18 1855 Heparin level - 0.31  Goal of Therapy:  Heparin level 0.3-0.7 units/ml Monitor platelets by anticoagulation protocol: Yes   Plan:  07/01/18: Heparin level- 0.14 at 1145. Level is subtherapeutic. Patient bolused with 2200 units and infusion rate increased to 1500 units/ hour.  Recheck heparin level in 8 hours (2030).  11/19:  HL @ 20:23 = 0.42 Will continue this pt on current rate and recheck HL on 11/20 with AM labs.  CBC with am labs per protocol.  [name]

## 2018-07-01 NOTE — Progress Notes (Signed)
[facility] [place] NAME: Moxon Messler    MR#:  [id number]  DATE OF BIRTH:  Jul 20, 1925  SUBJECTIVE:   Patient is resting comfortably, denies any chest pain.  Out of bed to chair.  Extubated Family at bedside  REVIEW OF SYSTEMS:    Review of Systems  Constitutional: Negative for fever, chills weight loss HENT: Negative for ear pain, nosebleeds, congestion, facial swelling, rhinorrhea, neck pain, neck stiffness and ear discharge.   Respiratory: ++for cough, shortness of breath, NO wheezing  Cardiovascular:B, NO palpitations and leg swelling.  Gastrointestinal: Negative for heartburn, abdominal pain, vomiting, diarrhea or consitpation Genitourinary: Negative for dysuria, urgency, frequency, hematuria Musculoskeletal: Negative for back pain or joint pain Neurological: Negative for dizziness, seizures, syncope, focal weakness,  numbness and headaches.  Hematological: Does not bruise/bleed easily.  Psychiatric/Behavioral: Negative for hallucinations, confusion, dysphoric mood    Tolerating Diet: yes      DRUG ALLERGIES:   Allergies  Allergen Reactions  . Penicillins Swelling    Has patient had a PCN reaction causing immediate rash, facial/tongue/throat swelling, SOB or lightheadedness with hypotension: Yes Has patient had a PCN reaction causing severe rash involving mucus membranes or skin necrosis: No Has patient had a PCN reaction that required hospitalization: No Has patient had a PCN reaction occurring within the last 10 years: No If all of the above answers are "NO", then may proceed with Cephalosporin use.  . Levaquin [Levofloxacin] Itching    VITALS:  Blood pressure 108/67, pulse 77, temperature (!) 97.5 F (36.4 C), temperature source Oral, resp. rate 16, height 6' (1.829 m), weight 74.6 kg, SpO2 97 %.  PHYSICAL EXAMINATION:  Constitutional: Appears well-developed and well-nourished. No distress. HENT: Normocephalic. Marland Kitchen  Oropharynx is clear and moist.  Eyes: Conjunctivae and EOM are normal. PERRLA, no scleral icterus.  Neck: Normal ROM. Neck supple. No JVD. No tracheal deviation. CVS: RRR, S1/S2 +, 2/6 murmurs, no gallops, no carotid bruit.  Pulmonary: Effort and breath sounds normal, no stridor, rhonchi, wheezes, rales.  Abdominal: Soft. BS +,  no distension, tenderness, rebound or guarding.  Musculoskeletal: Normal range of motion. No edema and no tenderness.  Neuro: Alert. CN 2-12 grossly intact. No focal deficits. Skin: Skin is warm and dry. No rash noted. Psychiatric: Normal mood and affect.      LABORATORY PANEL:   CBC Recent Labs  Lab 07/01/18 0248  WBC 3.3*  HGB 8.8*  HCT 27.3*  PLT 78*   ------------------------------------------------------------------------------------------------------------------  Chemistries  Recent Labs  Lab 07/01/18 0248  NA 136  K 4.3  CL 101  CO2 22  GLUCOSE 139*  BUN 25*  CREATININE 1.00  CALCIUM 9.3  MG 2.1   ------------------------------------------------------------------------------------------------------------------  Cardiac Enzymes Recent Labs  Lab 06/29/18 0630 06/30/18 0949 07/01/18 1109  TROPONINI 0.08* 5.23* 2.26*   ------------------------------------------------------------------------------------------------------------------  RADIOLOGY:  Dg Chest Port 1 View  Result Date: 07/01/2018 CLINICAL DATA:  Pneumonia. EXAM: PORTABLE CHEST 1 VIEW COMPARISON:  Radiographs of June 29, 2018. FINDINGS: Stable cardiomegaly. Bilateral perihilar and basilar opacities are noted concerning for pulmonary edema with increased bilateral pleural effusions. No pneumothorax is noted. Bony thorax is unremarkable. IMPRESSION: Bilateral perihilar and basilar opacities are noted concerning for pulmonary edema with increased bilateral pleural effusions. Electronically Signed   By: Marijo Conception, M.D.   On: 07/01/2018 07:23     ASSESSMENT AND  PLAN:   84 year old male with history of severe three-vessel coronary disease and moderate aortic stenosis who  presents to the ER due to chest pain with worsening EKG changes in the anterior leads.  1.  Non-ST elevation MI: Patient is status post cardiac catheterization which shows heavily calcified ostial LAD and circumflex not amendable to PCI without atherectomy. Cardiology is recommending medical management Follow-up and echocardiogram to evaluate EF. Continue Ranexa 500 mg twice daily in addition to current antianginal medications. Continue heparin for total 48 hours as per cardiology. Dual antiplatelet therapy with aspirin, Plavix, isosorbide, lisinopril, Zocor and Coreg  2.  Acute on chronic systolic heart failure with ischemic cardiomyopathy Furosemide 20 mg p.o. daily started Follow-up on echocardiogram Continue Coreg and lisinopril   3.  Pneumonia:  Levaquin x5 days  4 diabetes: Continue sliding scale  5.  History of MDS: Patient is [facility] at [facility] Patient currently on heparin drip and cardiology is recommending to discontinue heparin if platelet count falls below 50,000  6.  Constipation: Continue Colace scheduled, Senokot as needed Dulcolax as needed.  Will add MiraLAX on daily basis   Management plans discussed with the patient and family and they are in agreement.  CODE STATUS: Full  TOTAL TIME TAKING CARE OF THIS PATIENT: 33 minutes.     POSSIBLE D/C 1 to 2 days, DEPENDING ON CLINICAL CONDITION.   Nicholes Mango M.D on 07/01/2018 at 3:19 PM  Between 7am to 6pm - Pager - [phone] After 6pm go to [email or web address] - password [email or web address] [facility]  Office  [phone]  CC: Primary care physician; Zolotor, Audie Clear, MD  Note: This dictation was prepared with Dragon dictation along with smaller phrase technology. Any transcriptional errors that result from this process are unintentional.

## 2018-07-02 ENCOUNTER — Inpatient Hospital Stay: Payer: Medicare Other

## 2018-07-02 DIAGNOSIS — Z7189 Other specified counseling: Secondary | ICD-10-CM

## 2018-07-02 DIAGNOSIS — I200 Unstable angina: Secondary | ICD-10-CM

## 2018-07-02 DIAGNOSIS — J9811 Atelectasis: Secondary | ICD-10-CM

## 2018-07-02 DIAGNOSIS — Z515 Encounter for palliative care: Secondary | ICD-10-CM

## 2018-07-02 LAB — CBC WITH DIFFERENTIAL/PLATELET
Abs Immature Granulocytes: 0.16 10*3/uL — ABNORMAL HIGH (ref 0.00–0.07)
BASOS ABS: 0 10*3/uL (ref 0.0–0.1)
BASOS PCT: 0 %
EOS ABS: 0 10*3/uL (ref 0.0–0.5)
EOS PCT: 0 %
HEMATOCRIT: 28.3 % — AB (ref 39.0–52.0)
Hemoglobin: 9.1 g/dL — ABNORMAL LOW (ref 13.0–17.0)
Immature Granulocytes: 3 %
Lymphocytes Relative: 24 %
Lymphs Abs: 1.2 10*3/uL (ref 0.7–4.0)
MCH: 36.7 pg — AB (ref 26.0–34.0)
MCHC: 32.2 g/dL (ref 30.0–36.0)
MCV: 114.1 fL — AB (ref 80.0–100.0)
Monocytes Absolute: 1 10*3/uL (ref 0.1–1.0)
Monocytes Relative: 20 %
NEUTROS PCT: 53 %
NRBC: 0 % (ref 0.0–0.2)
Neutro Abs: 2.7 10*3/uL (ref 1.7–7.7)
PLATELETS: 78 10*3/uL — AB (ref 150–400)
RBC: 2.48 MIL/uL — AB (ref 4.22–5.81)
RDW: 15.1 % (ref 11.5–15.5)
Smear Review: NORMAL
WBC: 5.1 10*3/uL (ref 4.0–10.5)

## 2018-07-02 LAB — BASIC METABOLIC PANEL
ANION GAP: 9 (ref 5–15)
BUN: 29 mg/dL — ABNORMAL HIGH (ref 8–23)
CO2: 22 mmol/L (ref 22–32)
Calcium: 8.9 mg/dL (ref 8.9–10.3)
Chloride: 99 mmol/L (ref 98–111)
Creatinine, Ser: 1.1 mg/dL (ref 0.61–1.24)
GFR calc Af Amer: >60 mL/min (ref >60)
GFR, EST NON AFRICAN AMERICAN: 57 mL/min — AB (ref >60)
GLUCOSE: 162 mg/dL — AB (ref 70–99)
Potassium: 4.1 mmol/L (ref 3.5–5.1)
Sodium: 130 mmol/L — ABNORMAL LOW (ref 135–145)

## 2018-07-02 LAB — HEPARIN LEVEL (UNFRACTIONATED): Heparin Unfractionated: 0.46 IU/mL (ref 0.30–0.70)

## 2018-07-02 LAB — MAGNESIUM: Magnesium: 2.1 mg/dL (ref 1.7–2.4)

## 2018-07-02 LAB — PROCALCITONIN: Procalcitonin: 0.14 ng/mL

## 2018-07-02 LAB — GLUCOSE, CAPILLARY
GLUCOSE-CAPILLARY: 163 mg/dL — AB (ref 70–99)
Glucose-Capillary: 132 mg/dL — ABNORMAL HIGH (ref 70–99)
Glucose-Capillary: 121 mg/dL — ABNORMAL HIGH (ref 70–99)
Glucose-Capillary: 163 mg/dL — ABNORMAL HIGH (ref 70–99)

## 2018-07-02 LAB — CALCIUM, IONIZED: Calcium, Ionized, Serum: 5.1 mg/dL (ref 4.5–5.6)

## 2018-07-02 LAB — PHOSPHORUS: Phosphorus: 4.2 mg/dL (ref 2.5–4.6)

## 2018-07-02 MED ORDER — SENNOSIDES-DOCUSATE SODIUM 8.6-50 MG PO TABS
2.0000 | ORAL_TABLET | Freq: Every day | ORAL | Status: DC
  Administered 2018-07-02 – 2018-07-05 (×4): 2 via ORAL
  Filled 2018-07-02 (×4): qty 2

## 2018-07-02 MED ORDER — BUMETANIDE 0.5 MG PO TABS
0.5000 mg | ORAL_TABLET | Freq: Every day | ORAL | Status: DC
  Administered 2018-07-03: 0.5 mg via ORAL
  Filled 2018-07-02 (×3): qty 1

## 2018-07-02 MED ORDER — KETOROLAC TROMETHAMINE 15 MG/ML IJ SOLN
15.0000 mg | Freq: Once | INTRAMUSCULAR | Status: AC
  Administered 2018-07-02: 15 mg via INTRAVENOUS
  Filled 2018-07-02: qty 1

## 2018-07-02 NOTE — Consult Note (Signed)
Consultation Note Date: 07/02/2018   Patient Name: Terry Gonzales  DOB: March 20, 1925  MRN: [id number]  Age / Sex: 84 y.o., male  PCP: Zolotor, Audie Clear, MD Referring Physician: Nicholes Mango, MD  Reason for Consultation: Establishing goals of care  HPI/Patient Profile: 84 y.o. male  with past medical history of aortic stenosis, CAD, DM, HTN, ischemic cardiomyopathy, and myelodysplastic syndrome admitted on 06/29/2018 with chest pain. Diagnosed with NSTEMI. Per cardiology, patient has diffuse disease and heavy calcification of all 3 coronary arteries that limits both surgical and percutaneous revascularization options. Patient also being treated with pneumonia. PMT consulted by cardiology for goals of care and symptom management.  Clinical Assessment and Goals of Care: I have reviewed medical records including EPIC notes, labs and imaging, received report from RN, assessed the patient and then met with patient, wife, and daughter  to discuss diagnosis prognosis, GOC, EOL wishes, disposition and options.  I introduced Palliative Medicine as specialized medical care for people living with serious illness. It focuses on providing relief from the symptoms and stress of a serious illness. The goal is to improve quality of life for both the patient and the family.  We discussed a brief life review of the patient. He tells me he loves to work outside. 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 He tells me about his grandchildren and great grandchildren.  As far as functional and nutritional status, he describes decreased function - activity limited by shortness of breath. He has to pause frequently while ambulating and uses a walker. He requires assistance with bathing. He tells me he has a good appetite. No cognitive changes reported by patient or family.   Patient is tearful when he describes his decreased activity levels. Tells me  of a loss of independence d/t physical health. He is no longer able to drive and this is hard for him.    We discussed his current illness and what it means in the larger context of his on-going co-morbidities.  Natural disease trajectory and expectations at EOL were discussed. Patient tells me he understands that options are limited and he has advanced heart disease.   I attempted to elicit values and goals of care important to the patient.  He tells me his goal is to live 5-6 more years; then states he knows this is not likely. He is hopeful to be as active as possible.   Advance directives, concepts specific to code status, artifical feeding and hydration, and rehospitalization were considered and discussed. When I discussed code status, patient initially indicates he would not want resuscitation attempts and would like to change code status to DNR. His daughter and wife both indicated they disagreed and felt he should receive resuscitation attempts. We discussed resuscitation attempts in the setting of advanced cardiac disease. Patient tells me he wants to do whatever his family wants. Plan to continue this discussion after family has time to discuss further. For now, patient to remain full code. They do all agree they would not want him on a ventilator longer than a few days, no trach.   Hospice and Palliative Care services outpatient were explained and offered. Patient wants to consider if he would like outpatient palliative. He wants time to discuss this with his family.   Discussed symptom management. Patient c/o constipation and depression. Will schedule senna for him. We discussed medications for his mood; however, he tells me he has not done well with these medications in the  past and would not like to try them.   Throughout conversation, patient's wife seemed to be a bit resistant to discussion and attempted to end conversation. Patient indicated his goals of care will reflect whatever his  wife desires. She did ask me why palliative care was involved when they had never been involved before. We were able to have a discussion about being in a "different place" concerning Terry Gonzales health and why addressing goals of care was necessary.   Questions and concerns were addressed.The family was encouraged to call with questions or concerns.   Primary Decision Maker PATIENT    SUMMARY OF RECOMMENDATIONS   - patient and family considering code status - patient would elect DNR but family disagrees and he wants to do what his family wants - patient and family considering outpatient palliative - would recommend referral to outpatient palliative if patient d/c'd tomorrow to continue [place] discussion - scheduled senna for constipation - would recommend sublingual morphine on discharge - discussed with patient and family who agree -patient and family not interested in attempting SSRI for mood  Code Status/Advance Care Planning:  Full code  Palliative Prophylaxis:   Aspiration, Bowel Regimen, Delirium Protocol, Frequent Pain Assessment and Turn Reposition  Additional Recommendations (Limitations, Scope, Preferences):  No Tracheostomy  Psycho-social/Spiritual:   Desire for further Chaplaincy support:yes  Additional Recommendations: Education on Hospice  Prognosis:   Unable to determine  Discharge Planning: Home with Palliative Services      Primary Diagnoses: Present on Admission: . Non-ST elevation (NSTEMI) myocardial infarction [facility])   I have reviewed the medical record, interviewed the patient and family, and examined the patient. The following aspects are pertinent.  Past Medical History:  Diagnosis Date  . Aortic stenosis   . CAD (coronary artery disease)    a. NSTEMI 1/19 - LHC 1/19: ostial LAD 75%, proximal LAD 80%, ostial LCx 75%, mid LCX 75%, OM2 80%, ostial RCA 99%, mid RCA 95%, distal RCA 99%, med Rx  . Diabetes mellitus without complication (Terry Gonzales)   .  Hypertension   . Ischemic cardiomyopathy    a. TTE 1/19: EF of 30-35%, hypokinesis of the anterior, anteroseptal, and apical myocardium, Gr1DD, moderate aortic stenosis (may be under estimated due to his cardiomyopathy), RV systolic function normal, left atrium normal size, unable to estimate PASP  . Myelodysplastic syndrome ([place])    Social History   Socioeconomic History  . Marital status: Married    Spouse name: Not on file  . Number of children: Not on file  . Years of education: Not on file  . Highest education level: Not on file  Occupational History  . Occupation: retired  Scientific laboratory technician  . Financial resource strain: Not on file  . Food insecurity:    Worry: Not on file    Inability: Not on file  . Transportation needs:    Medical: Not on file    Non-medical: Not on file  Tobacco Use  . Smoking status: Former Smoker    Packs/day: 1.00    Types: Cigarettes    Last attempt to quit: 08/13/1968    Years since quitting: 49.9  . Smokeless tobacco: Never Used  Substance and Sexual Activity  . Alcohol use: No    Frequency: Never  . Drug use: No  . Sexual activity: Never  Lifestyle  . Physical activity:    Days per week: Not on file    Minutes per session: Not on file  . Stress: Not on file  Relationships  .  Social connections:    Talks on phone: Not on file    Gets together: Not on file    Attends religious service: Not on file    Active member of club or organization: Not on file    Attends meetings of clubs or organizations: Not on file    Relationship status: Not on file  Other Topics Concern  . Not on file  Social History Narrative  . Not on file   Family History  Problem Relation Age of Onset  . Hypertension Father    Scheduled Meds: . aspirin EC  81 mg Oral Daily  . bumetanide  0.5 mg Oral Daily  . carvedilol  3.125 mg Oral BID WC  . Chlorhexidine Gluconate Cloth  6 each Topical Q0600  . clopidogrel  75 mg Oral Daily  . [START ON 07/13/2018] Darbepoetin  Alfa  500 mcg Subcutaneous Q30 days  . docusate sodium  100 mg Oral Daily  . fluticasone  2 spray Each Nare Daily  . guaiFENesin  600 mg Oral BID  . insulin aspart  0-9 Units Subcutaneous TID WC  . isosorbide mononitrate  60 mg Oral Daily  . mouth rinse  15 mL Mouth Rinse BID  . multivitamin with minerals  1 tablet Oral Daily  . mupirocin ointment  1 application Nasal BID  . polyethylene glycol  17 g Oral Daily  . ranolazine  500 mg Oral BID  . simvastatin  10 mg Oral q1800  . sodium chloride flush  3 mL Intravenous Q12H   Continuous Infusions: . sodium chloride    . ceFEPime (MAXIPIME) IV Stopped (07/02/18 0954)   PRN Meds:.sodium chloride, acetaminophen, albuterol, alum & mag hydroxide-simeth, bisacodyl, morphine injection, nitroGLYCERIN, ondansetron (ZOFRAN) IV, senna-docusate, sodium chloride flush Allergies  Allergen Reactions  . Penicillins Swelling    Has patient had a PCN reaction causing immediate rash, facial/tongue/throat swelling, SOB or lightheadedness with hypotension: Yes Has patient had a PCN reaction causing severe rash involving mucus membranes or skin necrosis: No Has patient had a PCN reaction that required hospitalization: No Has patient had a PCN reaction occurring within the last 10 years: No If all of the above answers are "NO", then may proceed with Cephalosporin use.  [name] [Levofloxacin] Itching   Review of Systems  Constitutional: Positive for activity change. Negative for appetite change, fatigue and unexpected weight change.  Respiratory: Positive for cough. Negative for chest tightness and shortness of breath.   Cardiovascular: Negative for chest pain, palpitations and leg swelling.  Gastrointestinal: Positive for constipation. Negative for abdominal distention.  Neurological: Positive for weakness.    Physical Exam  Constitutional: He is oriented to person, place, and time. He appears well-developed and well-nourished. No distress.    Cardiovascular: Normal rate and regular rhythm.  Pulmonary/Chest: Effort normal and breath sounds normal. No accessory muscle usage. No respiratory distress.  Abdominal: Soft. Bowel sounds are normal. He exhibits no distension. There is no tenderness.  Musculoskeletal:       Right lower leg: Normal.       Left lower leg: Normal.  Neurological: He is alert and oriented to person, place, and time.  Skin: Skin is warm and dry.    Vital Signs: BP 120/75 (BP Location: Right Arm)   Pulse 78   Temp (!) 97.5 F (36.4 C) (Oral)   Resp 18   Ht 6' (1.829 m)   Wt 74.6 kg   SpO2 96%   BMI 22.31 kg/m  Pain Scale: 0-10  Pain Score: 0-No pain   SpO2: SpO2: 96 % O2 Device:SpO2: 96 % O2 Flow Rate: .O2 Flow Rate (L/min): 2 L/min  IO: Intake/output summary:   Intake/Output Summary (Last 24 hours) at 07/02/2018 1337 Last data filed at 07/02/2018 1245 Gross per 24 hour  Intake 1094.13 ml  Output 1101 ml  Net -6.87 ml    LBM: Last BM Date: 06/28/18 Baseline Weight: Weight: 72.6 kg Most recent weight: Weight: 74.6 kg     Palliative Assessment/Data: PPS 50%    Time Total: 70 minutes Greater than 50%  of this time was spent counseling and coordinating care related to the above assessment and plan.  Juel Burrow, DNP, AGNP-C Palliative Medicine Team [phone] Pager: [phone]

## 2018-07-02 NOTE — Progress Notes (Signed)
[place] for heparin Indication: chest pain/ACS  Allergies  Allergen Reactions  . Penicillins Swelling    Has patient had a PCN reaction causing immediate rash, facial/tongue/throat swelling, SOB or lightheadedness with hypotension: Yes Has patient had a PCN reaction causing severe rash involving mucus membranes or skin necrosis: No Has patient had a PCN reaction that required hospitalization: No Has patient had a PCN reaction occurring within the last 10 years: No If all of the above answers are "NO", then may proceed with Cephalosporin use.  [name] [Levofloxacin] Itching    Patient Measurements: Height: 6' (182.9 cm) Weight: 164 lb 7.4 oz (74.6 kg) IBW/kg (Calculated) : 77.6 Heparin Dosing Weight: 74.6 kg  Vital Signs: Temp: 98.2 F (36.8 C) (11/20 0418) Temp Source: Oral (11/20 0418) BP: 94/58 (11/20 0617) Pulse Rate: 62 (11/20 0617)  Labs: Recent Labs    06/29/18 0704  06/30/18 0949  07/01/18 0248 07/01/18 1109 07/01/18 2023 07/02/18 0525  HGB  --    < > 9.6*  --  8.8*  --   --  9.1*  HCT  --   --  30.2*  --  27.3*  --   --  28.3*  PLT  --   --  81*  --  78*  --   --  78*  APTT >160*  --   --   --   --   --   --   --   LABPROT 17.7*  --   --   --   --   --   --   --   INR 1.47  --   --   --   --   --   --   --   HEPARINUNFRC  --    < > 0.14*   < > 0.32 0.14* 0.42 0.46  CREATININE  --   --  0.77  --  1.00  --   --  1.10  TROPONINI  --   --  5.23*  --   --  2.26*  --   --    < > = values in this interval not displayed.    Estimated Creatinine Clearance: 46.2 mL/min (by C-G formula based on SCr of 1.1 mg/dL).   Medical History: Past Medical History:  Diagnosis Date  . Aortic stenosis   . CAD (coronary artery disease)    a. NSTEMI 1/19 - LHC 1/19: ostial LAD 75%, proximal LAD 80%, ostial LCx 75%, mid LCX 75%, OM2 80%, ostial RCA 99%, mid RCA 95%, distal RCA 99%, med Rx  . Diabetes mellitus without complication ([place])    . Hypertension   . Ischemic cardiomyopathy    a. TTE 1/19: EF of 30-35%, hypokinesis of the anterior, anteroseptal, and apical myocardium, Gr1DD, moderate aortic stenosis (may be under estimated due to his cardiomyopathy), RV systolic function normal, left atrium normal size, unable to estimate PASP  . Myelodysplastic syndrome [facility])     Assessment: 84 year old male admitted with chest pain. Patient s/p cardiac catheterization. Recommendation from Cardiology to use heparin for 48 hours.  Heparin infusing at 1050 units/hr  11/18 ~10:00 HL 0.14 11/18 1855 Heparin level - 0.31  Goal of Therapy:  Heparin level 0.3-0.7 units/ml Monitor platelets by anticoagulation protocol: Yes   Plan:  11/20 @ 0500 HL 0.46 therapeutic. Will continue rate @ 1500 units/hr and will recheck HL w/ am labs. CBC stable will continue to monitor.  Tobie Lords, PharmD, BCPS Clinical  Pharmacist 07/02/2018

## 2018-07-02 NOTE — Progress Notes (Signed)
Name: Terry Gonzales MRN: [id number] DOB: 05/08/1925     CONSULTATION DATE: 06/29/2018  Subjective & Objectives: C/O constipation. No chest pain last night  PAST MEDICAL HISTORY :   has a past medical history of Aortic stenosis, CAD (coronary artery disease), Diabetes mellitus without complication ([place]), Hypertension, Ischemic cardiomyopathy, and Myelodysplastic syndrome ([place]).  has a past surgical history that includes LEFT HEART CATH AND CORONARY ANGIOGRAPHY (N/A, 08/16/2017); Coronary/Graft Acute MI Revascularization (N/A, 06/29/2018); and LEFT HEART CATH AND CORONARY ANGIOGRAPHY (N/A, 06/29/2018). Prior to Admission medications   Medication Sig Start Date End Date Taking? Authorizing Provider  albuterol (PROVENTIL HFA;VENTOLIN HFA) 108 (90 Base) MCG/ACT inhaler Inhale 2 puffs into the lungs every 6 (six) hours as needed for wheezing or shortness of breath. 08/19/17  Yes Vaughan Basta, MD  aspirin EC 81 MG EC tablet Take 1 tablet (81 mg total) by mouth daily. 08/20/17  Yes Vaughan Basta, MD  carvedilol (COREG) 3.125 MG tablet Take 1 tablet (3.125 mg total) by mouth 2 (two) times daily. 09/23/17 02/28/26 Yes Dunn, Areta Haber, PA-C  clopidogrel (PLAVIX) 75 MG tablet Take 1 tablet (75 mg total) by mouth daily. 09/05/17  Yes Gollan, Kathlene November, MD  Darbepoetin Alfa (ARANESP) 500 MCG/ML SOSY injection Inject 500 mcg into the skin every 21 ( twenty-one) days.    Yes [provider]  docusate sodium (COLACE) 100 MG capsule Take 1 capsule (100 mg total) by mouth daily as needed. Patient taking differently: Take 100 mg by mouth at bedtime.  09/04/17 09/04/18 Yes Earleen Newport, MD  fluticasone (FLONASE) 50 MCG/ACT nasal spray Place 2 sprays into the nose daily. 02/20/16 02/28/26 Yes [provider]  isosorbide mononitrate (IMDUR) 30 MG 24 hr tablet Take 1 tablet (30 mg total) by mouth 2 (two) times daily. 06/25/18  Yes Gollan, Kathlene November, MD  lisinopril (PRINIVIL,ZESTRIL)  10 MG tablet Take 1 tablet (10 mg total) by mouth daily. 01/20/18  Yes Minna Merritts, MD  Multiple Vitamin (MULTI-VITAMINS) TABS Take 0.5 tablets by mouth daily.    Yes [provider]  Multiple Vitamins-Minerals (PRESERVISION AREDS PO) Take 1 tablet by mouth 2 (two) times daily.   Yes [provider]  nitroGLYCERIN (NITROSTAT) 0.4 MG SL tablet Place 1 tablet (0.4 mg total) under the tongue every 5 (five) minutes as needed for chest pain. 06/25/18  Yes Gollan, Kathlene November, MD  senna-docusate (SENOKOT-S) 8.6-50 MG tablet Take 2 tablets by mouth daily as needed for mild constipation. 09/04/17 09/04/18 Yes Earleen Newport, MD  simvastatin (ZOCOR) 10 MG tablet Take 10 mg by mouth at bedtime.    Yes [provider]   Allergies  Allergen Reactions  . Penicillins Swelling    Has patient had a PCN reaction causing immediate rash, facial/tongue/throat swelling, SOB or lightheadedness with hypotension: Yes Has patient had a PCN reaction causing severe rash involving mucus membranes or skin necrosis: No Has patient had a PCN reaction that required hospitalization: No Has patient had a PCN reaction occurring within the last 10 years: No If all of the above answers are "NO", then may proceed with Cephalosporin use.  [name] [Levofloxacin] Itching    FAMILY HISTORY:  family history includes Hypertension in his father. SOCIAL HISTORY:  reports that he quit smoking about 49 years ago. His smoking use included cigarettes. He smoked 1.00 pack per day. He has never used smokeless tobacco. He reports that he does not drink alcohol or use drugs.  REVIEW OF SYSTEMS:  Unable to obtain due to critical illness   VITAL SIGNS: Temp:  [97.7 F (36.5 C)-98.5 F (36.9 C)] 98.5 F (36.9 C) (11/20 0746) Pulse Rate:  [62-101] 71 (11/20 0830) Resp:  [12-27] 22 (11/20 0800) BP: (78-112)/(53-75) 109/64 (11/20 0830) SpO2:  [92 %-99 %] 99 % (11/20 0830)   Physical Examination: A  & O and no focal neuro deficits On [place] 3-4 L/min, no distress, able to talk in full sentences. BEAE and no rales S1 & S2 audible and no murmur Benign abdomen with normal peristalses No edema   ASSESSMENT / PLAN: Acute respiratory failure tolerating [place]. -Monitor work of breathing and O2 sat  NSTEMI / instable angina, CAD 3 vessel disease ( RCA, ostial LAD and Lt. Cx) not amenable for PCI, cardiomyopathy with LV EF 30-35% and acute on chronic CHF. -Lisinopril + Carvidalol + Heparin gtt. + Ranexa + ASA + Plavix + Statins + Diuresis -Management as per cardiology.   Atelectasis and pneumonia. Bibasilar airspace disease with pulmonary congestion. MRSA PCR +ve however Procal < 0.10 and afebrile -d/c Vanc + c/w Cefep. Monitor CXR + CBC + FIO2 -Developed reaction to Levofloxacin (localised arm burning and itching at infusion site) -Gentle diuresis to improve lung compliance  Prerenal azotemia with intravascular volume depletion -Optimize volume, avoid nephrotoxins, monitor renal panel and urine out put.  DM -Glycemic control  HTN -Optimize antihypertensives and monitor hemodynamics.  Myelodysplastic syndrome, thrombocytopenia and anemia -Monitor Plat (on Heparin gtt) -Keep HB > 8 gm/dl -Monitor CBC  Constipation -Bowel regimen  Full code  Supportive care  Critical care time 30 min

## 2018-07-02 NOTE — Progress Notes (Signed)
[name] NAME: Terry Gonzales    MR#:  [id number]  DATE OF BIRTH:  1925/06/08  SUBJECTIVE:   Patient is resting comfortably, no other episodes of chest pain in the past 12 hours.  Out of bed to chair.   Family at bedside  REVIEW OF SYSTEMS:    Review of Systems  Constitutional: Negative for fever, chills weight loss HENT: Negative for ear pain, nosebleeds, congestion, facial swelling, rhinorrhea, neck pain, neck stiffness and ear discharge.   Respiratory: ++for cough, shortness of breath, NO wheezing  Cardiovascular:B, NO palpitations and leg swelling.  Gastrointestinal: Negative for heartburn, abdominal pain, vomiting, diarrhea or consitpation Genitourinary: Negative for dysuria, urgency, frequency, hematuria Musculoskeletal: Negative for back pain or joint pain Neurological: Negative for dizziness, seizures, syncope, focal weakness,  numbness and headaches.  Hematological: Does not bruise/bleed easily.  Psychiatric/Behavioral: Negative for hallucinations, confusion, dysphoric mood    Tolerating Diet: yes      DRUG ALLERGIES:   Allergies  Allergen Reactions  . Penicillins Swelling    Has patient had a PCN reaction causing immediate rash, facial/tongue/throat swelling, SOB or lightheadedness with hypotension: Yes Has patient had a PCN reaction causing severe rash involving mucus membranes or skin necrosis: No Has patient had a PCN reaction that required hospitalization: No Has patient had a PCN reaction occurring within the last 10 years: No If all of the above answers are "NO", then may proceed with Cephalosporin use.  . Levaquin [Levofloxacin] Itching    VITALS:  Blood pressure 120/75, pulse 78, temperature (!) 97.5 F (36.4 C), temperature source Oral, resp. rate 18, height 6' (1.829 m), weight 74.6 kg, SpO2 96 %.  PHYSICAL EXAMINATION:  Constitutional: Appears well-developed and well-nourished. No  distress. HENT: Normocephalic. Marland Kitchen Oropharynx is clear and moist.  Eyes: Conjunctivae and EOM are normal. PERRLA, no scleral icterus.  Neck: Normal ROM. Neck supple. No JVD. No tracheal deviation. CVS: RRR, S1/S2 +, 2/6 murmurs, no gallops, no carotid bruit.  Pulmonary: Effort and breath sounds normal, no stridor, rhonchi, wheezes, rales.  Abdominal: Soft. BS +,  no distension, tenderness, rebound or guarding.  Musculoskeletal: Normal range of motion. No edema and no tenderness.  Neuro: Alert. CN 2-12 grossly intact. No focal deficits. Skin: Skin is warm and dry. No rash noted. Psychiatric: Normal mood and affect.      LABORATORY PANEL:   CBC Recent Labs  Lab 07/02/18 0525  WBC 5.1  HGB 9.1*  HCT 28.3*  PLT 78*   ------------------------------------------------------------------------------------------------------------------  Chemistries  Recent Labs  Lab 07/02/18 0525  NA 130*  K 4.1  CL 99  CO2 22  GLUCOSE 162*  BUN 29*  CREATININE 1.10  CALCIUM 8.9  MG 2.1   ------------------------------------------------------------------------------------------------------------------  Cardiac Enzymes Recent Labs  Lab 06/29/18 0630 06/30/18 0949 07/01/18 1109  TROPONINI 0.08* 5.23* 2.26*   ------------------------------------------------------------------------------------------------------------------  RADIOLOGY:  Dg Chest Port 1 View  Result Date: 07/02/2018 CLINICAL DATA:  Pneumonia EXAM: PORTABLE CHEST 1 VIEW COMPARISON:  One day prior FINDINGS: Midline trachea. Mild cardiomegaly. Layering small bilateral pleural effusions. No pneumothorax. Moderate interstitial edema persists. Bibasilar airspace disease is not significantly changed. IMPRESSION: No significant change since one day prior. Moderate congestive heart failure with layering bilateral pleural effusions and bibasilar airspace disease. This could represent atelectasis or concurrent infection. Electronically  Signed   By: Abigail Miyamoto M.D.   On: 07/02/2018 07:12   Dg Chest Port 1 View  Result Date: 07/01/2018 CLINICAL  DATA:  Pneumonia. EXAM: PORTABLE CHEST 1 VIEW COMPARISON:  Radiographs of June 29, 2018. FINDINGS: Stable cardiomegaly. Bilateral perihilar and basilar opacities are noted concerning for pulmonary edema with increased bilateral pleural effusions. No pneumothorax is noted. Bony thorax is unremarkable. IMPRESSION: Bilateral perihilar and basilar opacities are noted concerning for pulmonary edema with increased bilateral pleural effusions. Electronically Signed   By: Marijo Conception, M.D.   On: 07/01/2018 07:23     ASSESSMENT AND PLAN:   84 year old male with history of severe three-vessel coronary disease and moderate aortic stenosis who presents to the ER due to chest pain with worsening EKG changes in the anterior leads.  1.  Non-ST elevation MI: Patient is status post cardiac catheterization which shows heavily calcified ostial LAD and circumflex not amendable to PCI without atherectomy. Cardiology is recommending medical management Follow-up and echocardiogram to evaluate EF. Continue Ranexa 500 mg twice daily in addition to current antianginal medications.  heparin gtt for total 48 hours as per cardiology. Dual antiplatelet therapy with aspirin, Plavix, isosorbide, lisinopril, Zocor and Coreg Cardiology will consider discharging the patient if he is chest pain-free in a.m.  Follow-up with cardiology  2.  Acute on chronic systolic heart failure with ischemic cardiomyopathy Furosemide 20 mg p.o. daily started Follow-up on echocardiogram Continue Coreg and lisinopril   3.  Pneumonia:  Levaquin x5 days  4 prerenal azotemia with intravascular volume depletion IV fluids and avoid nephrotoxins and monitor renal function closely Monitor intake and output  5.  History of MDS: Patient is [facility] Patient currently on heparin drip and cardiology is recommending to  discontinue heparin if platelet count falls below 50,000  6.  Constipation: Continue Colace scheduled, Senokot as needed Dulcolax as needed.  Will add MiraLAX on daily basis  7.  Thrombocytopenia platelet count at 78,000 Heparin drip  discontinued after 48 hours.  No active bleeding.  Repeat platelet count in a.m. Patient transferred to telemetry  Management plans discussed with the patient and daughter at bedside and they are in agreement.  CODE STATUS: Full  TOTAL TIME TAKING CARE OF THIS PATIENT: 35 minutes.     POSSIBLE D/C 1 to 2 days, DEPENDING ON CLINICAL CONDITION.   Nicholes Mango M.D on 07/02/2018 at 2:57 PM  Between 7am to 6pm - Pager - [phone] After 6pm go to [email or web address] - password [email or web address] [facility]  Office  [phone]  CC: Primary care physician; Zolotor, Audie Clear, MD  Note: This dictation was prepared with Dragon dictation along with smaller phrase technology. Any transcriptional errors that result from this process are unintentional.

## 2018-07-02 NOTE — Progress Notes (Addendum)
Progress Note  Patient Name: Terry Gonzales Date of Encounter: 07/02/2018  Primary Cardiologist: Rockey Situ  Subjective   Chest pain yesterday when straining on the commode No chest pain since that time He is a poor historian, asks the same question repeatedly Daughter in the room  Inpatient Medications    Scheduled Meds: . aspirin EC  81 mg Oral Daily  . bumetanide  0.5 mg Oral Daily  . carvedilol  3.125 mg Oral BID WC  . Chlorhexidine Gluconate Cloth  6 each Topical Q0600  . clopidogrel  75 mg Oral Daily  . [START ON 07/13/2018] Darbepoetin Alfa  500 mcg Subcutaneous Q30 days  . docusate sodium  100 mg Oral Daily  . fluticasone  2 spray Each Nare Daily  . guaiFENesin  600 mg Oral BID  . insulin aspart  0-9 Units Subcutaneous TID WC  . isosorbide mononitrate  60 mg Oral Daily  . mouth rinse  15 mL Mouth Rinse BID  . multivitamin with minerals  1 tablet Oral Daily  . mupirocin ointment  1 application Nasal BID  . polyethylene glycol  17 g Oral Daily  . ranolazine  500 mg Oral BID  . senna-docusate  2 tablet Oral QHS  . simvastatin  10 mg Oral q1800  . sodium chloride flush  3 mL Intravenous Q12H   Continuous Infusions: . sodium chloride    . ceFEPime (MAXIPIME) IV Stopped (07/02/18 0954)   PRN Meds: sodium chloride, acetaminophen, albuterol, alum & mag hydroxide-simeth, bisacodyl, morphine injection, nitroGLYCERIN, ondansetron (ZOFRAN) IV, sodium chloride flush   Vital Signs    Vitals:   07/02/18 1000 07/02/18 1015 07/02/18 1155 07/02/18 1523  BP: (!) 89/60 (!) 92/58 120/75 104/65  Pulse: 83 71 78 75  Resp:   18 18  Temp:   (!) 97.5 F (36.4 C) (!) 97.4 F (36.3 C)  TempSrc:   Oral Oral  SpO2: 95% 95% 96% 95%  Weight:      Height:        Intake/Output Summary (Last 24 hours) at 07/02/2018 1646 Last data filed at 07/02/2018 1409 Gross per 24 hour  Intake 958.61 ml  Output 751 ml  Net 207.61 ml   Filed Weights   06/29/18 0642 06/29/18 0833  Weight:  72.6 kg 74.6 kg    Telemetry    Normal sinus rhythm  ECG    No new tracing.  Physical Exam   Constitutional:  oriented to person, place, and time. No distress.  Sitting up in recliner HENT:  Head: Grossly normal Eyes:  no discharge. No scleral icterus.  Neck: No JVD, no carotid bruits  Cardiovascular: Regular rate and rhythm, no murmurs appreciated Pulmonary/Chest: Clear to auscultation bilaterally, no wheezes or rails Abdominal: Soft.  no distension.  no tenderness.  Musculoskeletal: Normal range of motion Neurological:  normal muscle tone. Coordination normal. No atrophy Skin: Skin warm and dry Psychiatric: normal affect, pleasant    Labs    Chemistry Recent Labs  Lab 06/30/18 0949 07/01/18 0248 07/02/18 0525  NA 135 136 130*  K 4.4 4.3 4.1  CL 105 101 99  CO2 25 22 22   GLUCOSE 181* 139* 162*  BUN 22 25* 29*  CREATININE 0.77 1.00 1.10  CALCIUM 8.9 9.3 8.9  GFRNONAA >60 >60 57*  GFRAA >60 >60 >60  ANIONGAP 5 13 9      Hematology Recent Labs  Lab 06/30/18 0949 07/01/18 0248 07/02/18 0525  WBC 3.2* 3.3* 5.1  RBC 2.62* 2.40* 2.48*  HGB 9.6* 8.8* 9.1*  HCT 30.2* 27.3* 28.3*  MCV 115.3* 113.8* 114.1*  MCH 36.6* 36.7* 36.7*  MCHC 31.8 32.2 32.2  RDW 15.2 15.0 15.1  PLT 81* 78* 78*    Cardiac Enzymes Recent Labs  Lab 06/29/18 0630 06/30/18 0949 07/01/18 1109  TROPONINI 0.08* 5.23* 2.26*   No results for input(s): TROPIPOC in the last 168 hours.   BNP Recent Labs  Lab 06/30/18 0012  BNP 1,420.0*     DDimer No results for input(s): DDIMER in the last 168 hours.   Radiology    Dg Chest Port 1 View  Result Date: 07/02/2018 CLINICAL DATA:  Pneumonia EXAM: PORTABLE CHEST 1 VIEW COMPARISON:  One day prior FINDINGS: Midline trachea. Mild cardiomegaly. Layering small bilateral pleural effusions. No pneumothorax. Moderate interstitial edema persists. Bibasilar airspace disease is not significantly changed. IMPRESSION: No significant change since  one day prior. Moderate congestive heart failure with layering bilateral pleural effusions and bibasilar airspace disease. This could represent atelectasis or concurrent infection. Electronically Signed   By: Abigail Miyamoto M.D.   On: 07/02/2018 07:12   Dg Chest Port 1 View  Result Date: 07/01/2018 CLINICAL DATA:  Pneumonia. EXAM: PORTABLE CHEST 1 VIEW COMPARISON:  Radiographs of June 29, 2018. FINDINGS: Stable cardiomegaly. Bilateral perihilar and basilar opacities are noted concerning for pulmonary edema with increased bilateral pleural effusions. No pneumothorax is noted. Bony thorax is unremarkable. IMPRESSION: Bilateral perihilar and basilar opacities are noted concerning for pulmonary edema with increased bilateral pleural effusions. Electronically Signed   By: Marijo Conception, M.D.   On: 07/01/2018 07:23    Cardiac Studies   LHC (06/29/18): 1.  Severe heavily calcified three-vessel coronary artery disease with moderate left main stenosis.  Ostial left circumflex stenosis appears to be worse than January.  The RCA is chronically occluded with collaterals from the left circumflex. 2.  Moderately elevated left ventricular end-diastolic pressure at 22 mmHg.  Left ventricular angiography was not performed. 3.  Mild aortic stenosis by gradient with less than 10 mmHg peak to peak gradient.  Diagnostic Diagram        Patient Profile     84 y.o. male with history of multivessel CAD, ischemic cardiomyopathy, and aortic stenosis admitted with high risk NSTEMI.  Assessment & Plan    NSTEMI No chest pain in past 24 hours Limited options for his severely calcified, stenotic three-vessel coronary disease, aortic valve stenosis -Case discussed with interventional cardiology, he is not a candidate for advanced therapies in [place] -They have recommended palliative -Medical management limited by hypotension This morning with systolic pressures in the 73V, uncertain he will be able to  tolerate isosorbide 60 daily.  Continue to monitor  Ischemic cardiomyopathy and acute on chronic systolic heart failure LVEDP mildly elevated at the time of catheterization  He is asymptomatic.  Blood pressure low Problems with constipation, family reports he does not drink much during the daytime Would monitor renal function closely on low-dose Bumex  Aortic stenosis Significant disease, not a candidate at this time for valve surgery  Myelodysplastic syndrome Low platelets, with anemia We will try to maintain hemoglobin of 10 given chronic anginal symptoms   Discussion with patient and daughter in the room this morning, Discussed that he is not a candidate for aggressive cardiac therapies including stenting, aortic valve replacement Recommended chest pain be treated with nitro and morphine We have recommended hospice Patient's wife was not available for this discussion I feel he patient himself is not able  to make decisions, given suspected dementia, confusion Daughter seem to welcome the possibility of assistance at home with hospice services and medical management  --We will need to continue conversation concerning his CODE STATUS In the event of a code, he would likely not survive cardiac resuscitation   Total encounter time more than 35 minutes  Greater than 50% was spent in counseling and coordination of care with the patient   For questions or updates, please contact [place] Please consult [email or web address] for contact info under [facility] Cardiology.     Signed, Ida Rogue, MD  07/02/2018, 4:46 PM

## 2018-07-02 NOTE — Progress Notes (Addendum)
Pt complaining of 8/10 chest pain describing it as a "tightness" in chest associated with shortness of breath. Pt on 2L [place]. 1 SL nitro given and prn morphine for pain. BP following first nitro 97\63.  2302: Pt states chest pain has gotten a little better but still rates pain 7/10. Will continue to monitor closely. BP 94\60. Prime doc notified. Patient still complaining of shortness of breath. Exp wheezing noted. Prn albuterol treatment given.   Per prime doc, give 15mg  IV Toradol for pain control. Will continue to monitor.

## 2018-07-03 ENCOUNTER — Inpatient Hospital Stay: Payer: Medicare Other

## 2018-07-03 DIAGNOSIS — Z66 Do not resuscitate: Secondary | ICD-10-CM

## 2018-07-03 DIAGNOSIS — R531 Weakness: Secondary | ICD-10-CM

## 2018-07-03 LAB — GLUCOSE, CAPILLARY
Glucose-Capillary: 116 mg/dL — ABNORMAL HIGH (ref 70–99)
Glucose-Capillary: 115 mg/dL — ABNORMAL HIGH (ref 70–99)
Glucose-Capillary: 132 mg/dL — ABNORMAL HIGH (ref 70–99)
Glucose-Capillary: 139 mg/dL — ABNORMAL HIGH (ref 70–99)

## 2018-07-03 LAB — URINALYSIS, COMPLETE (UACMP) WITH MICROSCOPIC
Bacteria, UA: NONE SEEN
Bilirubin Urine: NEGATIVE
GLUCOSE, UA: NEGATIVE mg/dL
HGB URINE DIPSTICK: NEGATIVE
KETONES UR: 5 mg/dL — AB
LEUKOCYTES UA: NEGATIVE
NITRITE: NEGATIVE
PH: 5 (ref 5.0–8.0)
Protein, ur: NEGATIVE mg/dL
Specific Gravity, Urine: 1.014 (ref 1.005–1.030)

## 2018-07-03 LAB — PHOSPHORUS: Phosphorus: 4 mg/dL (ref 2.5–4.6)

## 2018-07-03 LAB — CBC WITH DIFFERENTIAL/PLATELET
Abs Immature Granulocytes: 0.19 10*3/uL — ABNORMAL HIGH (ref 0.00–0.07)
BASOS ABS: 0 10*3/uL (ref 0.0–0.1)
Basophils Relative: 0 %
EOS PCT: 0 %
Eosinophils Absolute: 0 10*3/uL (ref 0.0–0.5)
HEMATOCRIT: 26.1 % — AB (ref 39.0–52.0)
HEMOGLOBIN: 8.5 g/dL — AB (ref 13.0–17.0)
Immature Granulocytes: 4 %
LYMPHS ABS: 0.8 10*3/uL (ref 0.7–4.0)
LYMPHS PCT: 18 %
MCH: 36.3 pg — ABNORMAL HIGH (ref 26.0–34.0)
MCHC: 32.6 g/dL (ref 30.0–36.0)
MCV: 111.5 fL — AB (ref 80.0–100.0)
MONOS PCT: 21 %
Monocytes Absolute: 1 10*3/uL (ref 0.1–1.0)
NRBC: 0 % (ref 0.0–0.2)
Neutro Abs: 2.5 10*3/uL (ref 1.7–7.7)
Neutrophils Relative %: 57 %
Platelets: 74 10*3/uL — ABNORMAL LOW (ref 150–400)
RBC: 2.34 MIL/uL — ABNORMAL LOW (ref 4.22–5.81)
RDW: 14.8 % (ref 11.5–15.5)
WBC: 4.5 10*3/uL (ref 4.0–10.5)

## 2018-07-03 LAB — BASIC METABOLIC PANEL
Anion gap: 10 (ref 5–15)
BUN: 35 mg/dL — AB (ref 8–23)
CALCIUM: 9.2 mg/dL (ref 8.9–10.3)
CO2: 23 mmol/L (ref 22–32)
CREATININE: 1.19 mg/dL (ref 0.61–1.24)
Chloride: 94 mmol/L — ABNORMAL LOW (ref 98–111)
GFR calc Af Amer: 60 mL/min — ABNORMAL LOW (ref >60)
GFR, EST NON AFRICAN AMERICAN: 52 mL/min — AB (ref >60)
GLUCOSE: 142 mg/dL — AB (ref 70–99)
POTASSIUM: 4.5 mmol/L (ref 3.5–5.1)
SODIUM: 127 mmol/L — AB (ref 135–145)

## 2018-07-03 LAB — BLOOD GAS, ARTERIAL
Acid-base deficit: 1.1 mmol/L (ref 0.0–2.0)
BICARBONATE: 22.6 mmol/L (ref 20.0–28.0)
FIO2: 0.28
O2 Saturation: 98.9 %
PCO2 ART: 34 mmHg (ref 32.0–48.0)
PH ART: 7.43 (ref 7.350–7.450)
Patient temperature: 37
pO2, Arterial: 101 mmHg (ref 83.0–108.0)

## 2018-07-03 LAB — CALCIUM, IONIZED: Calcium, Ionized, Serum: 5.2 mg/dL (ref 4.5–5.6)

## 2018-07-03 LAB — MAGNESIUM: MAGNESIUM: 2.1 mg/dL (ref 1.7–2.4)

## 2018-07-03 MED ORDER — FUROSEMIDE 10 MG/ML IJ SOLN
20.0000 mg | Freq: Once | INTRAMUSCULAR | Status: AC
  Administered 2018-07-03: 20 mg via INTRAVENOUS
  Filled 2018-07-03: qty 2

## 2018-07-03 MED ORDER — SODIUM CHLORIDE 0.9 % IV SOLN
INTRAVENOUS | Status: DC
  Administered 2018-07-03 – 2018-07-05 (×4): via INTRAVENOUS

## 2018-07-03 NOTE — Progress Notes (Signed)
Patient ID: Terry Gonzales, male   DOB: [birth year], 84 y.o.   MRN: [id number]  This NP visited patient at the bedside as a follow up to  yesterday's [place], at the request of Dr. Anselm Jungling.  Patient had decompensation  through the night experiencing chest pain, shortness of breath and stated fear of dying.  Revisited the patient at bedside along with his wife and daughter for continued conversation regarding diagnosis, prognosis, goals of care, end-of-life wishes, disposition and options.  Patient's son is coming in this evening from [place]  Discussed his significant cardiac disease /limited viable treatment options and Dr. Donivan Scull recommendation for hospice at home.  Prognosis is less than 6 months  Today the patient,  wife and daughter verbalize an understanding of the seriousness of the medical situation and the long-term limited prognosis.   Plan of care -DNR/DNI--documented today -Transition home with hospice- will write for choice  -Continue medical management of chronic disease -Symptom management:       -Convert IV morphine to Roxanol prior to discharge home   Motional support offered.  Created space and opportunity for patient and family to explore thoughts and feelings regarding current medical situation.  We discussed human mortality and anticipatory grief.  Questions and concerns addressed.  Family encouraged to call with questions or concerns.   Discussed with Dr Anselm Jungling   Total time spent on the unit was 60 minutes  Greater than 50% of the time was spent in counseling and coordination of care  Wadie Lessen NP  Palliative Medicine Team Team Phone # [phone] Pager [phone]

## 2018-07-03 NOTE — Care Management Important Message (Signed)
Important Message  Patient Details  Name: Terry Gonzales MRN: [id number] Date of Birth: July 21, 1925   Medicare Important Message Given:  Yes    Elza Rafter, RN 07/03/2018, 4:37 PM

## 2018-07-03 NOTE — Progress Notes (Signed)
Pt had episode of emesis. Prn zofran given. Will continue to monitor.

## 2018-07-03 NOTE — Progress Notes (Signed)
[facility] [place] NAME: Jayan Raymundo    MR#:  [id number]  DATE OF BIRTH:  1924-12-26  SUBJECTIVE:   Patient had some agitation last night and more confusion he also received some medication to control his mood.  More confused today as per family. Family at bedside  REVIEW OF SYSTEMS:    Review of Systems  Patient is confused and cannot give review of system.  Tolerating Diet: yes  DRUG ALLERGIES:   Allergies  Allergen Reactions  . Penicillins Swelling    Has patient had a PCN reaction causing immediate rash, facial/tongue/throat swelling, SOB or lightheadedness with hypotension: Yes Has patient had a PCN reaction causing severe rash involving mucus membranes or skin necrosis: No Has patient had a PCN reaction that required hospitalization: No Has patient had a PCN reaction occurring within the last 10 years: No If all of the above answers are "NO", then may proceed with Cephalosporin use.  . Levaquin [Levofloxacin] Itching    VITALS:  Blood pressure 113/76, pulse 75, temperature 97.6 F (36.4 C), temperature source Oral, resp. rate 19, height 6' (1.829 m), weight 74.6 kg, SpO2 100 %.  PHYSICAL EXAMINATION:   Constitutional: Appears well-developed and well-nourished. No distress. HENT: Normocephalic. Marland Kitchen Oropharynx is clear and moist.  Eyes: Conjunctivae and EOM are normal. PERRLA, no scleral icterus.  Neck: Normal ROM. Neck supple. No JVD. No tracheal deviation. CVS: RRR, S1/S2 +, 2/6 murmurs, no gallops, no carotid bruit.  Pulmonary: Effort and breath sounds normal, no stridor, rhonchi, wheezes, rales.  Abdominal: Soft. BS +,  no distension, tenderness, rebound or guarding.  Musculoskeletal: Normal range of motion. No edema and no tenderness.  Neuro: Alert. CN 2-12 grossly intact. No focal deficits. Pt is disoriented. Skin: Skin is warm and dry. No rash noted. Psychiatric: And is confused but moving all 4  limbs.  LABORATORY PANEL:   CBC Recent Labs  Lab 07/03/18 0445  WBC 4.5  HGB 8.5*  HCT 26.1*  PLT 74*   ------------------------------------------------------------------------------------------------------------------  Chemistries  Recent Labs  Lab 07/03/18 0445  NA 127*  K 4.5  CL 94*  CO2 23  GLUCOSE 142*  BUN 35*  CREATININE 1.19  CALCIUM 9.2  MG 2.1   ------------------------------------------------------------------------------------------------------------------  Cardiac Enzymes Recent Labs  Lab 06/29/18 0630 06/30/18 0949 07/01/18 1109  TROPONINI 0.08* 5.23* 2.26*   ------------------------------------------------------------------------------------------------------------------  RADIOLOGY:  Dg Chest Port 1 View  Result Date: 07/03/2018 CLINICAL DATA:  Atelectasis EXAM: PORTABLE CHEST 1 VIEW COMPARISON:  Yesterday FINDINGS: Haziness of the bilateral chest from pleural effusions. Lower lungs are partially obscured. Pulmonary edema. Cardiopericardial enlargement and vascular pedicle widening. Artifact from EKG leads. IMPRESSION: 1. Layering pleural effusions obscuring lower lungs. 2. Pulmonary edema. Electronically Signed   By: Monte Fantasia M.D.   On: 07/03/2018 06:54   Dg Chest Port 1 View  Result Date: 07/02/2018 CLINICAL DATA:  Pneumonia EXAM: PORTABLE CHEST 1 VIEW COMPARISON:  One day prior FINDINGS: Midline trachea. Mild cardiomegaly. Layering small bilateral pleural effusions. No pneumothorax. Moderate interstitial edema persists. Bibasilar airspace disease is not significantly changed. IMPRESSION: No significant change since one day prior. Moderate congestive heart failure with layering bilateral pleural effusions and bibasilar airspace disease. This could represent atelectasis or concurrent infection. Electronically Signed   By: Abigail Miyamoto M.D.   On: 07/02/2018 07:12     ASSESSMENT AND PLAN:   84 year old male with history of severe  three-vessel coronary disease and moderate aortic stenosis who  presents to the ER due to chest pain with worsening EKG changes in the anterior leads.  1.  Non-ST elevation MI: Patient is status post cardiac catheterization which shows heavily calcified ostial LAD and circumflex not amendable to PCI without atherectomy. Cardiology is recommending medical management Echo- EF 20%. Continue Ranexa 500 mg twice daily in addition to current antianginal medications.  heparin gtt for total 48 hours as per cardiology. Stopped now. Dual antiplatelet therapy with aspirin, Plavix, isosorbide, lisinopril, Zocor and Coreg  2.  Acute on chronic systolic heart failure with ischemic cardiomyopathy- EF 20-25% Furosemide 20 mg p.o. daily started Follow-up on echocardiogram Continue Coreg and lisinopril  3.  Pneumonia:  Levaquin x5 days  4 prerenal azotemia with intravascular volume depletion IV fluids and avoid nephrotoxins and monitor renal function closely Monitor intake and output  5.  History of MDS: Patient is followed at [facility] Patient currently on heparin drip and cardiology is recommending to discontinue heparin if platelet count falls below 50,000  6.  Constipation: Continue Colace scheduled, Senokot as needed Dulcolax as needed.  Will add MiraLAX on daily basis  7.  Thrombocytopenia platelet count at 74,000 Heparin drip  discontinued after 48 hours.  No active bleeding.  Repeat platelet count in a.m. Patient transferred to telemetry  8.  Hyponatremia This could be due to use of diuretics. I will give gentle IV hydration which is going to make his CHF worse. I discussed this difficult situation with patient's wife and daughter in the room.  Encouraged them make some decisions considering very high chances of worsening cardiac status.  9.  Altered mental status-metabolic encephalopathy This could be due to his overall sickness, receiving morphine last night, hyponatremia. Follow with IV fluids  and improving sodium level.   Management plans discussed with the patient and daughter at bedside and they are in agreement.  CODE STATUS: Full  TOTAL TIME TAKING CARE OF THIS PATIENT: 35 minutes.    POSSIBLE D/C 1 to 2 days, DEPENDING ON CLINICAL CONDITION.   Vaughan Basta M.D on 07/03/2018 at 2:26 PM  Between 7am to 6pm - Pager - [phone] After 6pm go to [email or web address] - password [email or web address]  Office  [phone]  CC: Primary care physician; Zolotor, Audie Clear, MD  Note: This dictation was prepared with Dragon dictation along with smaller phrase technology. Any transcriptional errors that result from this process are unintentional.

## 2018-07-03 NOTE — Progress Notes (Signed)
IV Toradol given for pt's continued chest pain. Pt complaining of shortness of breath.  Pt stating " I don't feel like I can make it, I feel like going to die." This RN increased 02 level to 4L [employer]. Crackles noted in pt's lungs. Prime doc notified. Per orders, will give 20 IVP Lasix. BP 118/74. Will continue to monitor.

## 2018-07-03 NOTE — Progress Notes (Signed)
Family Meeting Note  Advance Directive:no  Today a meeting took place with the spouse and daughter.  Patient is unable to participate due VU:[id number] capacity confusion   The following clinical team members were present during this meeting:MD  The following were discussed:Patient's diagnosis: Non-STEMI, severe ischemic cardiomyopathy with ejection fraction 25%, hyponatremia, altered mental status, Patient's progosis: Unable to determine and Goals for treatment: DNR  I had discussion today with patient's daughter and wife as he had change in the mental status They met with palliative care yesterday and still wanted to continue doing everything for him. As now with worsening condition I have explained this would be very difficult and most likely not going to go well as he would need to have some fluid over cut down on diuretics because of his hyponatremia which would make his CHF worse. Now they are leaning towards reconsidering palliative care and hospice.  Additional follow-up to be provided: Cardiology and palliative care  Time spent during discussion:20 minutes  Terry Basta, MD

## 2018-07-03 NOTE — Progress Notes (Signed)
Pharmacy Antibiotic Note  Terry Gonzales is a 84 y.o. male admitted on 06/29/2018 with pneumonia.  Pharmacy has been consulted for cefepime dosing.  Plan: Continue cefepime 2g IV every 12 hours based on CrCl ~43 mL/min.   No improvement/change in WBC; patient remains afebrile. Scr is slowly trending up from 0.77 on 11/18 to 1.19 on 11/21.  Height: 6' (182.9 cm) Weight: 164 lb 7.4 oz (74.6 kg) IBW/kg (Calculated) : 77.6  Temp (24hrs), Avg:97.5 F (36.4 C), Min:97.4 F (36.3 C), Max:97.6 F (36.4 C)  Recent Labs  Lab 06/29/18 0630 06/30/18 0949 07/01/18 0248 07/02/18 0525 07/03/18 0445  WBC 3.0* 3.2* 3.3* 5.1 4.5  CREATININE 0.99 0.77 1.00 1.10 1.19    Estimated Creatinine Clearance: 42.7 mL/min (by C-G formula based on SCr of 1.19 mg/dL).    Allergies  Allergen Reactions  . Penicillins Swelling    Has patient had a PCN reaction causing immediate rash, facial/tongue/throat swelling, SOB or lightheadedness with hypotension: Yes Has patient had a PCN reaction causing severe rash involving mucus membranes or skin necrosis: No Has patient had a PCN reaction that required hospitalization: No Has patient had a PCN reaction occurring within the last 10 years: No If all of the above answers are "NO", then may proceed with Cephalosporin use.  [name] [Levofloxacin] Itching    Antimicrobials this admission: Levaquin x 1 dose  11/18 Cefepime 11/18 >>  Vancomycin 11/18 >> 11/19  Cultures: 11/17 MRSA PCR: positive   Thank you for allowing pharmacy to be a part of this patient's care.  Ocie Doyne, PharmD Candidate  07/03/2018 7:54 AM

## 2018-07-04 ENCOUNTER — Inpatient Hospital Stay: Payer: Medicare Other | Admitting: Internal Medicine

## 2018-07-04 ENCOUNTER — Inpatient Hospital Stay: Payer: Medicare Other

## 2018-07-04 DIAGNOSIS — I10 Essential (primary) hypertension: Secondary | ICD-10-CM

## 2018-07-04 DIAGNOSIS — D696 Thrombocytopenia, unspecified: Secondary | ICD-10-CM

## 2018-07-04 DIAGNOSIS — Z79899 Other long term (current) drug therapy: Secondary | ICD-10-CM

## 2018-07-04 DIAGNOSIS — D709 Neutropenia, unspecified: Secondary | ICD-10-CM

## 2018-07-04 DIAGNOSIS — R630 Anorexia: Secondary | ICD-10-CM

## 2018-07-04 DIAGNOSIS — R0602 Shortness of breath: Secondary | ICD-10-CM

## 2018-07-04 DIAGNOSIS — E119 Type 2 diabetes mellitus without complications: Secondary | ICD-10-CM

## 2018-07-04 DIAGNOSIS — Z7982 Long term (current) use of aspirin: Secondary | ICD-10-CM

## 2018-07-04 DIAGNOSIS — Z87891 Personal history of nicotine dependence: Secondary | ICD-10-CM

## 2018-07-04 DIAGNOSIS — R05 Cough: Secondary | ICD-10-CM

## 2018-07-04 LAB — BASIC METABOLIC PANEL
Anion gap: 5 (ref 5–15)
Anion gap: 10 (ref 5–15)
BUN: 34 mg/dL — ABNORMAL HIGH (ref 8–23)
BUN: 36 mg/dL — AB (ref 8–23)
CALCIUM: 8.9 mg/dL (ref 8.9–10.3)
CHLORIDE: 98 mmol/L (ref 98–111)
CO2: 24 mmol/L (ref 22–32)
CO2: 21 mmol/L — ABNORMAL LOW (ref 22–32)
CREATININE: 1.05 mg/dL (ref 0.61–1.24)
Calcium: 8.9 mg/dL (ref 8.9–10.3)
Chloride: 94 mmol/L — ABNORMAL LOW (ref 98–111)
Creatinine, Ser: 1.05 mg/dL (ref 0.61–1.24)
GFR calc Af Amer: >60 mL/min (ref >60)
GFR, EST NON AFRICAN AMERICAN: 60 mL/min — AB (ref >60)
GFR, EST NON AFRICAN AMERICAN: 60 mL/min — AB (ref >60)
Glucose, Bld: 128 mg/dL — ABNORMAL HIGH (ref 70–99)
Glucose, Bld: 213 mg/dL — ABNORMAL HIGH (ref 70–99)
POTASSIUM: 4.7 mmol/L (ref 3.5–5.1)
Potassium: 4.8 mmol/L (ref 3.5–5.1)
SODIUM: 127 mmol/L — AB (ref 135–145)
SODIUM: 125 mmol/L — AB (ref 135–145)

## 2018-07-04 LAB — CALCIUM, IONIZED: Calcium, Ionized, Serum: 5.2 mg/dL (ref 4.5–5.6)

## 2018-07-04 LAB — SODIUM: Sodium: 126 mmol/L — ABNORMAL LOW (ref 135–145)

## 2018-07-04 LAB — CBC
HEMATOCRIT: 27.3 % — AB (ref 39.0–52.0)
Hemoglobin: 9 g/dL — ABNORMAL LOW (ref 13.0–17.0)
MCH: 37 pg — ABNORMAL HIGH (ref 26.0–34.0)
MCHC: 33 g/dL (ref 30.0–36.0)
MCV: 112.3 fL — ABNORMAL HIGH (ref 80.0–100.0)
NRBC: 0 % (ref 0.0–0.2)
PLATELETS: 81 10*3/uL — AB (ref 150–400)
RBC: 2.43 MIL/uL — ABNORMAL LOW (ref 4.22–5.81)
RDW: 14.9 % (ref 11.5–15.5)
WBC: 4.4 10*3/uL (ref 4.0–10.5)

## 2018-07-04 LAB — GLUCOSE, CAPILLARY
GLUCOSE-CAPILLARY: 111 mg/dL — AB (ref 70–99)
GLUCOSE-CAPILLARY: 138 mg/dL — AB (ref 70–99)
GLUCOSE-CAPILLARY: 148 mg/dL — AB (ref 70–99)
Glucose-Capillary: 163 mg/dL — ABNORMAL HIGH (ref 70–99)

## 2018-07-04 MED ORDER — MORPHINE SULFATE (CONCENTRATE) 10 MG/0.5ML PO SOLN
5.0000 mg | ORAL | Status: DC | PRN
  Administered 2018-07-04 – 2018-07-06 (×2): 5 mg via ORAL
  Filled 2018-07-04 (×2): qty 1

## 2018-07-04 MED ORDER — TOLVAPTAN 15 MG PO TABS
15.0000 mg | ORAL_TABLET | Freq: Once | ORAL | Status: AC
  Administered 2018-07-04: 15 mg via ORAL
  Filled 2018-07-04: qty 1

## 2018-07-04 MED ORDER — MORPHINE SULFATE (CONCENTRATE) 10 MG/0.5ML PO SOLN: 5.0000 mg | ORAL | 0 refills | Status: AC | PRN

## 2018-07-04 MED ORDER — PROMETHAZINE HCL 25 MG/ML IJ SOLN
12.5000 mg | Freq: Four times a day (QID) | INTRAMUSCULAR | Status: DC | PRN
  Administered 2018-07-05: 12.5 mg via INTRAVENOUS
  Filled 2018-07-04: qty 1

## 2018-07-04 MED ORDER — PANTOPRAZOLE SODIUM 40 MG PO TBEC
40.0000 mg | DELAYED_RELEASE_TABLET | Freq: Every day | ORAL | Status: DC
  Administered 2018-07-05 – 2018-07-06 (×3): 40 mg via ORAL
  Filled 2018-07-04 (×3): qty 1

## 2018-07-04 NOTE — Progress Notes (Signed)
New referral for [facility] services at home received from [place] following a Palliative medicine consult. Patient is a 84 year old man with a significant cardiac history admitted [name] from home on 11/17 with a  NSTEMI. Per cardiology, patient has diffuse disease and heavy calcification of all 3 coronary arteries that limits both surgical and percutaneous revascularization options. Patient also being treated with pneumonia. Palliative medicine was consulted for goals of care and have met with family over the course of his hospitalization. Patient and family have chosen to focus on comfort with the support of hospice services in the home. Writer met in the family room with patient's son  Terry Gonzales, daughter Terry Gonzales and grandson Terry Gonzales to initiate education regarding hospice services, philosophy and team approach to care with understanding voiced. Questions answered, emotional support provided. Patient will require a hospital bed and oxygen to be in place prior to discharge. DME requested for delivery today with planned discharge home tomorrow via EMS with signed out of facility DNR in place. Hospice information and contact number given to [name]. Writer spoke in the room with patient and his wife, patient was alert, but remains with some confusion. Both appreciative of visit. Patient information faxed to referral. Hospital care team updated. Thank you for the opportunity to be involved in the care of this patient and his family. Flo Shanks RN, BSN, [name], hospital liaison [phone]

## 2018-07-04 NOTE — Progress Notes (Signed)
Pharmacy Antibiotic Note  Terry Gonzales is a 84 y.o. male admitted on 06/29/2018 with pneumonia.  Pharmacy has been consulted for cefepime dosing.  Plan: Continue cefepime 2g IV every 12 hours based on CrCl ~48 mL/min.   Cefepime started 11/18    Height: 6' (182.9 cm) Weight: 164 lb 7.4 oz (74.6 kg) IBW/kg (Calculated) : 77.6  Temp (24hrs), Avg:97.7 F (36.5 C), Min:97.2 F (36.2 C), Max:98.3 F (36.8 C)  Recent Labs  Lab 06/30/18 0949 07/01/18 0248 07/02/18 0525 07/03/18 0445 07/04/18 0519  WBC 3.2* 3.3* 5.1 4.5 4.4  CREATININE 0.77 1.00 1.10 1.19 1.05    Estimated Creatinine Clearance: 48.4 mL/min (by C-G formula based on SCr of 1.05 mg/dL).    Allergies  Allergen Reactions  . Penicillins Swelling    Has patient had a PCN reaction causing immediate rash, facial/tongue/throat swelling, SOB or lightheadedness with hypotension: Yes Has patient had a PCN reaction causing severe rash involving mucus membranes or skin necrosis: No Has patient had a PCN reaction that required hospitalization: No Has patient had a PCN reaction occurring within the last 10 years: No If all of the above answers are "NO", then may proceed with Cephalosporin use.  [name] [Levofloxacin] Itching    Antimicrobials this admission: Levaquin x 1 dose  11/18 Cefepime 11/18 >>  Vancomycin 11/18 >> 11/19  Cultures: 11/17 MRSA PCR: positive   Thank you for allowing pharmacy to be a part of this patient's care.  Chinita Greenland PharmD Clinical Pharmacist 07/04/2018

## 2018-07-04 NOTE — Progress Notes (Signed)
Daily Progress Note   Patient Name: Terry Gonzales       Date: 07/04/2018 DOB: 1924/12/21  Age: 84 y.o. MRN#: [id number] Attending Physician: Vaughan Basta, * Primary Care Physician: Neil Crouch, MD Admit Date: 06/29/2018  Reason for Consultation/Follow-up: Establishing goals of care, Hospice Evaluation, Non pain symptom management and Pain control  Subjective: Patient comfortable; slightly confused. Family at bedside.   Length of Stay: 5  Current Medications: Scheduled Meds:  . aspirin EC  81 mg Oral Daily  . carvedilol  3.125 mg Oral BID WC  . clopidogrel  75 mg Oral Daily  . [START ON 07/13/2018] Darbepoetin Alfa  500 mcg Subcutaneous Q30 days  . docusate sodium  100 mg Oral Daily  . fluticasone  2 spray Each Nare Daily  . guaiFENesin  600 mg Oral BID  . insulin aspart  0-9 Units Subcutaneous TID WC  . isosorbide mononitrate  60 mg Oral Daily  . mouth rinse  15 mL Mouth Rinse BID  . multivitamin with minerals  1 tablet Oral Daily  . polyethylene glycol  17 g Oral Daily  . ranolazine  500 mg Oral BID  . senna-docusate  2 tablet Oral QHS  . simvastatin  10 mg Oral q1800  . sodium chloride flush  3 mL Intravenous Q12H  . tolvaptan  15 mg Oral Once    Continuous Infusions: . sodium chloride    . sodium chloride 50 mL/hr at 07/04/18 0407  . ceFEPime (MAXIPIME) IV 2 g (07/04/18 0959)    PRN Meds: sodium chloride, acetaminophen, albuterol, alum & mag hydroxide-simeth, bisacodyl, morphine CONCENTRATE, nitroGLYCERIN, ondansetron (ZOFRAN) IV, sodium chloride flush  Physical Exam     Constitutional: He is disoriented and lethargic. He appears well-developed and well-nourished. No distress.  Cardiovascular: Normal rate and regular rhythm.  Pulmonary/Chest: Effort normal and  breath sounds normal. No accessory muscle usage. No respiratory distress.  Abdominal: Soft. Bowel sounds are normal. He exhibits no distension. There is no tenderness.  Musculoskeletal:       Right lower leg: Normal.       Left lower leg: Normal.  Neurological: He is alert and oriented to person, place, and time.  Skin: Skin is warm and dry.   Vital Signs: BP 103/72 (BP Location: Left Arm)   Pulse 87   Temp (!) 97.2 F (36.2 C) (Oral)   Resp 18   Ht 6' (1.829 m)   Wt 74.6 kg   SpO2 98%   BMI 22.31 kg/m  SpO2: SpO2: 98 % O2 Device: O2 Device: Nasal Cannula O2 Flow Rate: O2 Flow Rate (L/min): 4 L/min  Intake/output summary:   Intake/Output Summary (Last 24 hours) at 07/04/2018 1403 Last data filed at 07/04/2018 0434 Gross per 24 hour  Intake 635.45 ml  Output 1250 ml  Net -614.55 ml   LBM: Last BM Date: 07/02/18 Baseline Weight: Weight: 72.6 kg Most recent weight: Weight: 74.6 kg       Palliative Assessment/Data: PPS 40%    Flowsheet Rows     Most Recent Value  Intake Tab  Referral Department  Cardiology  Unit at Time of Referral  Cardiac/Telemetry Unit  Palliative Care Primary Diagnosis  Cardiac  Date Notified  07/02/18  Palliative Care Type  New Palliative care  Reason for referral  Clarify Goals of Care  Date of Admission  06/29/18  Date first seen by Palliative Care  07/02/18  # of days Palliative referral response time  0 Day(s)  # of days IP prior to Palliative referral  3  Clinical Assessment  Palliative Performance Scale Score  50%  Psychosocial & Spiritual Assessment  Palliative Care Outcomes  Patient/Family meeting held?  Yes  Who was at the meeting?  patient, daughter, wife  Palliative Care Outcomes  Improved pain interventions, Improved non-pain symptom therapy, Clarified goals of care, Counseled regarding hospice, Provided psychosocial or spiritual support, Linked to palliative care logitudinal support      Patient Active Problem List    Diagnosis Date Noted  . DNR (do not resuscitate)   . Weakness generalized   . Goals of care, counseling/discussion   . Palliative care by specialist   . Non-ST elevation (NSTEMI) myocardial infarction ([place]) 06/29/2018  . CAD (coronary artery disease), native coronary artery 10/21/2017  . Chest pain 09/01/2017  . MDS (myelodysplastic syndrome), low grade ([place]) 08/20/2017  . NSTEMI (non-ST elevated myocardial infarction) ([place])   . Acute combined systolic and diastolic heart failure ([place])   . Severe aortic stenosis   . Pneumonia 08/14/2017    Palliative Care Assessment & Plan   HPI: 84 y.o. male  with past medical history of aortic stenosis, CAD, DM, HTN, ischemic cardiomyopathy, and myelodysplastic syndrome admitted on 06/29/2018 with chest pain. Diagnosed with NSTEMI. Per cardiology, patient has diffuse disease and heavy calcification of all 3 coronary arteries that limits both surgical and percutaneous revascularization options. Patient also being treated with pneumonia. PMT consulted by cardiology for goals of care and symptom management.  Assessment: Follow up with patient and family at bedside. Patient and family has decided to go home with hospice following conversations with cardiology and patient's clinical decline over the past 2 days. We discussed philosophy of hospice care. They express understanding.  Discussed medication regimen - will change IV morphine to sublingual. Patient had bowel movement Wed - taking miralax and senna daily. No current discomfort. Questions and concerns addressed. Emotional support provided.   Recommendations/Plan:  Home w/ hospice likely tomorrow  Continue senna, miralax, sublingual morphine on discharge  Code Status:  DNR  Prognosis:   poor prognosis r/t significant cardiac disease with limited treatment options  Discharge Planning:  Home with Hospice  Care plan was discussed with patient's daughter and wife, Dr. Anselm Jungling  Thank you for  allowing the Palliative Medicine Team to assist in the care of this patient.   Total Time 25 minutes Prolonged Time Billed  no       Greater than 50%  of this time was spent counseling and coordinating care related to the above assessment and plan.  Juel Burrow, DNP, [facility] Palliative Medicine Team Team Phone # [phone]  Pager [phone]

## 2018-07-04 NOTE — Care Management Note (Signed)
Case Management Note  Patient Details  Name: Terry Gonzales MRN: [id number] Date of Birth: 04/26/1925  Subjective/Objective:       Patient is from home with wife.  CM consult for home with hospice.  Wife, son and daughter at bedside.  This RNCM  Offered choice to patient and family.  They stated that Santiago Glad with [facility] was going to be stopping by today.  They would like to use [facility].  They would like a hospital bed and will need home oxygen.  Santiago Glad, RN made aware of referral.             Action/Plan:   Expected Discharge Date:                  Expected Discharge Plan:  Home w Hospice Care  In-House Referral:     Discharge planning Services  CM Consult  Post Acute Care Choice:    Choice offered to:     DME Arranged:    DME Agency:     HH Arranged:    HH Agency:     Status of Service:  Completed, signed off  If discussed at [employer] of Stay Meetings, dates discussed:    Additional Comments:  Elza Rafter, RN 07/04/2018, 9:38 AM

## 2018-07-04 NOTE — Progress Notes (Addendum)
Pt states that his still feel sick on his stomach after giving zofran. Pt states he have no pain at this time. Pt refusing Maalox. Notify prime Will continue to monitor.  Update 2335: Dr. Duane Boston ordered Protonix 40 mg tablet daily and Phenergan 12.5 Mg IV every 6 hours as needed for Nausea. Will continue to monitor.

## 2018-07-04 NOTE — Consult Note (Signed)
[facility] CONSULT NOTE  Patient Care Team: Zolotor, Audie Clear, MD as PCP - General Rogue Bussing Elisha Headland, MD as Medical Oncologist (Medical Oncology)  CHIEF COMPLAINTS/PURPOSE OF CONSULTATION:  MDS  HISTORY OF PRESENTING ILLNESS:  Terry Gonzales 84 y.o.  male with history of low-grade MDS; an extensive history of coronary artery disease is currently admitted to the hospital for non-STEMI.  Patient has been eval by cardiology had a cardiac cath has significant coronary artery disease; given his age not a candidate for any coronary interventions.  He is recommended medical management.  Patient has been needing nitrates/morphine in addition to antiplatelet therapy for his cardiac disease.  Given his ongoing chest pain/recommendation the hospital patient has been recommended hospice from cardiac standpoint.  Hematology has been consulted for recommendations regarding Aranesp and patient is on hospice.  Patient has been receiving Aranesp for MDS every 3 weeks also.  Patient's hemoglobin has been stable between 9 and 10 for the last many months.  Patient's stable moderate thrombocytopenia/moderate neutropenia-not needing any interventions or frequent infections.  Patient has ongoing chest pain.  Ongoing mild cough.  Ongoing shortness of breath with exertion.  Mild swelling of of the legs.  No fevers or chills.  Poor appetite.  Overall feels poorly.  He  ROS: A complete 10 point review of system is done which is negative except mentioned above in history of present illness  MEDICAL HISTORY:  Past Medical History:  Diagnosis Date  . Aortic stenosis   . CAD (coronary artery disease)    a. NSTEMI 1/19 - LHC 1/19: ostial LAD 75%, proximal LAD 80%, ostial LCx 75%, mid LCX 75%, OM2 80%, ostial RCA 99%, mid RCA 95%, distal RCA 99%, med Rx  . Diabetes mellitus without complication ([place])   . Hypertension   . Ischemic cardiomyopathy    a. TTE 1/19: EF of 30-35%, hypokinesis of the anterior,  anteroseptal, and apical myocardium, Gr1DD, moderate aortic stenosis (may be under estimated due to his cardiomyopathy), RV systolic function normal, left atrium normal size, unable to estimate PASP  . Myelodysplastic syndrome ([place])     SURGICAL HISTORY: Past Surgical History:  Procedure Laterality Date  . CORONARY/GRAFT ACUTE MI REVASCULARIZATION N/A 06/29/2018   Procedure: Coronary/Graft Acute MI Revascularization;  Surgeon: Wellington Hampshire, MD;  Location: [facility];  Service: Cardiovascular;  Laterality: N/A;  . LEFT HEART CATH AND CORONARY ANGIOGRAPHY N/A 08/16/2017   Procedure: LEFT HEART CATH AND CORONARY ANGIOGRAPHY;  Surgeon: Minna Merritts, MD;  Location: [facility];  Service: Cardiovascular;  Laterality: N/A;  . LEFT HEART CATH AND CORONARY ANGIOGRAPHY N/A 06/29/2018   Procedure: LEFT HEART CATH AND CORONARY ANGIOGRAPHY;  Surgeon: Wellington Hampshire, MD;  Location: [facility];  Service: Cardiovascular;  Laterality: N/A;    SOCIAL HISTORY: Social History   Socioeconomic History  . Marital status: Married    Spouse name: Not on file  . Number of children: Not on file  . Years of education: Not on file  . Highest education level: Not on file  Occupational History  . Occupation: retired  Scientific laboratory technician  . Financial resource strain: Not on file  . Food insecurity:    Worry: Not on file    Inability: Not on file  . Transportation needs:    Medical: Not on file    Non-medical: Not on file  Tobacco Use  . Smoking status: Former Smoker    Packs/day: 1.00    Types:  Cigarettes    Last attempt to quit: 08/13/1968    Years since quitting: 49.9  . Smokeless tobacco: Never Used  Substance and Sexual Activity  . Alcohol use: No    Frequency: Never  . Drug use: No  . Sexual activity: Never  Lifestyle  . Physical activity:    Days per week: Not on file    Minutes per session: Not on file  . Stress: Not on file  Relationships  . Social  connections:    Talks on phone: Not on file    Gets together: Not on file    Attends religious service: Not on file    Active member of club or organization: Not on file    Attends meetings of clubs or organizations: Not on file    Relationship status: Not on file  . Intimate partner violence:    Fear of current or ex partner: Not on file    Emotionally abused: Not on file    Physically abused: Not on file    Forced sexual activity: Not on file  Other Topics Concern  . Not on file  Social History Narrative  . Not on file    FAMILY HISTORY: Family History  Problem Relation Age of Onset  . Hypertension Father     ALLERGIES:  is allergic to penicillins and levaquin [levofloxacin].  MEDICATIONS:  No current facility-administered medications for this encounter.    Current Outpatient Medications  Medication Sig Dispense Refill  . albuterol (PROVENTIL HFA;VENTOLIN HFA) 108 (90 Base) MCG/ACT inhaler Inhale 2 puffs into the lungs every 6 (six) hours as needed for wheezing or shortness of breath. 1 Inhaler 2  . aspirin EC 81 MG EC tablet Take 1 tablet (81 mg total) by mouth daily. 30 tablet 0  . carvedilol (COREG) 3.125 MG tablet Take 1 tablet (3.125 mg total) by mouth 2 (two) times daily. 180 tablet 3  . clopidogrel (PLAVIX) 75 MG tablet Take 1 tablet (75 mg total) by mouth daily. 90 tablet 3  . Darbepoetin Alfa (ARANESP) 500 MCG/ML SOSY injection Inject 500 mcg into the skin every 21 ( twenty-one) days.     . fluticasone (FLONASE) 50 MCG/ACT nasal spray Place 2 sprays into the nose daily.    . isosorbide mononitrate (IMDUR) 30 MG 24 hr tablet Take 1 tablet (30 mg total) by mouth 2 (two) times daily. 180 tablet 3  . Multiple Vitamin (MULTI-VITAMINS) TABS Take 0.5 tablets by mouth daily.     . Multiple Vitamins-Minerals (PRESERVISION AREDS PO) Take 1 tablet by mouth 2 (two) times daily.    . nitroGLYCERIN (NITROSTAT) 0.4 MG SL tablet Place 1 tablet (0.4 mg total) under the tongue every  5 (five) minutes as needed for chest pain. 25 tablet 3  . senna-docusate (SENOKOT-S) 8.6-50 MG tablet Take 2 tablets by mouth daily as needed for mild constipation. 30 tablet 1  . simvastatin (ZOCOR) 10 MG tablet Take 10 mg by mouth at bedtime.     Marland Kitchen acetaminophen (TYLENOL) 325 MG tablet Take 2 tablets (650 mg total) by mouth every 4 (four) hours as needed for headache or mild pain.    . bisacodyl (DULCOLAX) 5 MG EC tablet Take 1 tablet (5 mg total) by mouth daily as needed for moderate constipation. 30 tablet 0  . docusate sodium (COLACE) 100 MG capsule Take 1 capsule (100 mg total) by mouth at bedtime.    . Morphine Sulfate (MORPHINE CONCENTRATE) 10 MG/0.5ML SOLN concentrated solution Take 0.25 mLs (5  mg total) by mouth every 2 (two) hours as needed for moderate pain, severe pain or shortness of breath. 118 mL 0  . [START ON 07/07/2018] polyethylene glycol (MIRALAX / GLYCOLAX) packet Take 17 g by mouth daily. 14 each 0  . ranolazine (RANEXA) 500 MG 12 hr tablet Take 1 tablet (500 mg total) by mouth 2 (two) times daily. 60 tablet 0   Facility-Administered Medications Ordered in Other Encounters  Medication Dose Route Frequency Provider Last Rate Last Dose  . Darbepoetin Alfa (ARANESP) injection 500 mcg  500 mcg Subcutaneous Weekly Charlaine Dalton R, MD   500 mcg at 12/27/17 1048      .  PHYSICAL EXAMINATION:  Vitals:   07/06/18 0800 07/06/18 1432  BP:  115/81  Pulse:  84  Resp:  18  Temp:    SpO2: 97% 95%   Filed Weights   06/29/18 0642 06/29/18 0833  Weight: 160 lb (72.6 kg) 164 lb 7.4 oz (74.6 kg)    GENERAL: Frail appearing Caucasian male patient.  He is resting in the bed.  Accompanied by his wife and son.  2 L of oxygen. EYES: no pallor or icterus OROPHARYNX: no thrush or ulceration. NECK: supple, no masses felt LYMPH:  no palpable lymphadenopathy in the cervical, axillary or inguinal regions LUNGS: decreased breath sounds to auscultation at bases and  No wheeze or  crackles HEART/CVS: regular rate & rhythm and no murmurs; No lower extremity edema ABDOMEN: abdomen soft, non-tender and normal bowel sounds Musculoskeletal:no cyanosis of digits and no clubbing  PSYCH: alert & oriented x 3 with fluent speech NEURO: no focal motor/sensory deficits SKIN:  no rashes or significant lesions; chronic ecchymosis.  LABORATORY DATA:  I have reviewed the data as listed Lab Results  Component Value Date   WBC 4.4 07/04/2018   HGB 9.0 (L) 07/04/2018   HCT 27.3 (L) 07/04/2018   MCV 112.3 (H) 07/04/2018   PLT 81 (L) 07/04/2018   Recent Labs    05/23/18 1021 05/29/18 1826 06/13/18 1104  07/05/18 0627 07/05/18 1912 07/06/18 0431 07/06/18 1225  NA 140 135 137   < > 130* 133* 137 136  K 3.9 4.5 4.0   < > 4.9 4.6 4.7  --   CL 109 102 105   < > 97* 101 103  --   CO2 23 23 24    < > 24 23 24   --   GLUCOSE 163* 144* 106*   < > 133* 164* 107*  --   BUN 26* 21 24*   < > 33* 31* 30*  --   CREATININE 0.89 0.94 0.91   < > 1.22 1.14 1.14  --   CALCIUM 9.6 9.5 9.6   < > 9.3 9.3 9.4  --   GFRNONAA >60 >60 >60   < > 50* 54* 54*  --   GFRAA >60 >60 >60   < > 58* >60 >60  --   PROT 7.1 7.3 7.1  --   --   --   --   --   ALBUMIN 4.0 4.1 4.0  --   --   --   --   --   AST 21 25 17   --   --   --   --   --   ALT 11 16 13   --   --   --   --   --   ALKPHOS 92 109 95  --   --   --   --   --  BILITOT 0.7 0.6 0.7  --   --   --   --   --    < > = values in this interval not displayed.    RADIOGRAPHIC STUDIES: I have personally reviewed the radiological images as listed and agreed with the findings in the report. Dg Abd 1 View  Result Date: 07/06/2018 CLINICAL DATA:  Evaluate constipation EXAM: ABDOMEN - 1 VIEW COMPARISON:  May 29, 2018 FINDINGS: No significant constipation.  No acute abnormalities. IMPRESSION: No significant constipation identified. Electronically Signed   By: Dorise Bullion III M.D   On: 07/06/2018 14:24   Dg Chest Port 1 View  Result Date:  07/03/2018 CLINICAL DATA:  Atelectasis EXAM: PORTABLE CHEST 1 VIEW COMPARISON:  Yesterday FINDINGS: Haziness of the bilateral chest from pleural effusions. Lower lungs are partially obscured. Pulmonary edema. Cardiopericardial enlargement and vascular pedicle widening. Artifact from EKG leads. IMPRESSION: 1. Layering pleural effusions obscuring lower lungs. 2. Pulmonary edema. Electronically Signed   By: Monte Fantasia M.D.   On: 07/03/2018 06:54   Dg Chest Port 1 View  Result Date: 07/02/2018 CLINICAL DATA:  Pneumonia EXAM: PORTABLE CHEST 1 VIEW COMPARISON:  One day prior FINDINGS: Midline trachea. Mild cardiomegaly. Layering small bilateral pleural effusions. No pneumothorax. Moderate interstitial edema persists. Bibasilar airspace disease is not significantly changed. IMPRESSION: No significant change since one day prior. Moderate congestive heart failure with layering bilateral pleural effusions and bibasilar airspace disease. This could represent atelectasis or concurrent infection. Electronically Signed   By: Abigail Miyamoto M.D.   On: 07/02/2018 07:12   Dg Chest Port 1 View  Result Date: 07/01/2018 CLINICAL DATA:  Pneumonia. EXAM: PORTABLE CHEST 1 VIEW COMPARISON:  Radiographs of June 29, 2018. FINDINGS: Stable cardiomegaly. Bilateral perihilar and basilar opacities are noted concerning for pulmonary edema with increased bilateral pleural effusions. No pneumothorax is noted. Bony thorax is unremarkable. IMPRESSION: Bilateral perihilar and basilar opacities are noted concerning for pulmonary edema with increased bilateral pleural effusions. Electronically Signed   By: Marijo Conception, M.D.   On: 07/01/2018 07:23   Dg Chest Portable 1 View  Result Date: 06/29/2018 CLINICAL DATA:  Acute onset of left-sided chest pain, radiating to the neck. Shortness of breath. EXAM: PORTABLE CHEST 1 VIEW COMPARISON:  Chest radiograph performed 09/01/2017 FINDINGS: The lungs are hyperexpanded, with flattening  of the hemidiaphragms, compatible with COPD. Bibasilar airspace opacities, right greater than left, may reflect asymmetric pulmonary edema or pneumonia. No definite pleural effusion or pneumothorax is seen. The cardiomediastinal silhouette is borderline normal in size. No acute osseous abnormalities are seen. IMPRESSION: 1. Bibasilar airspace opacities, right greater than left, may reflect asymmetric pulmonary edema or pneumonia. 2. Findings of COPD. Electronically Signed   By: Garald Balding M.D.   On: 06/29/2018 06:49    ASSESSMENT & PLAN:   #84 year old male patient with history of CAD/also history of myelodysplastic syndrome low-grade is currently admitted to hospital for non-STEMI.  # Low-grade myelodysplastic syndrome-mild neutropenia/severe anemia/moderate thrombocytopenia-currently on Aranesp.  Hemoglobin admission i approximately around platelets 180s.  Normal white count.  Hold Aranesp today/see discussion below  #Severe CAD/non-STEMI-not amenable to intervention as per cardiology.  Medical management as per cardiology.  #Prognosis-patient's prognosis from cardiac standpoint is poor as per cardiology.  Given his multiple admissions/ongoing chest pain; unfortunately absence of any good interventional options from cardiology-hospice has been recommended.  Patient's MDS unfortunately complicates patient's clinical situation.  Given given patient's declining performance status/quality of life from cardiac standpoint [ongoing chest pain]-I think  it is reasonable to forego Aranesp injections/visits to the cancer center.  If patient has significant anemia blood transfusion could be performed on a periodic basis/based on patient's symptoms if hospice is agreeable.   Thank you Dr.Vachhani for allowing me to participate in the care of your pleasant patient. Please do not hesitate to contact me with questions or concerns in the interim.  Apparently was discussed with patient's cardiologist Dr. Rockey Situ.   Also informed Dr. Anselm Jungling and also palliative care nurse practitioner Wadie Lessen.    All questions were answered. The patient knows to call the clinic with any problems, questions or concerns.  # 60 minutes face-to-face with the patient discussing the above plan of care; more than 50% of time spent on prognosis/ natural history; counseling and coordination.  Plan of care was discussed with the patient and his wife and son in detail.  They agree.    Cammie Sickle, MD 07/06/2018 7:11 PM

## 2018-07-04 NOTE — Progress Notes (Signed)
[place] [facility] [place] NAME: Zavior Thomason    MR#:  [id number]  DATE OF BIRTH:  10-02-1924  SUBJECTIVE:   Confusion improved some , Family at bedside.  REVIEW OF SYSTEMS:    Review of Systems  Patient is confused and cannot give review of system.  Tolerating Diet: yes.  DRUG ALLERGIES:   Allergies  Allergen Reactions  . Penicillins Swelling    Has patient had a PCN reaction causing immediate rash, facial/tongue/throat swelling, SOB or lightheadedness with hypotension: Yes Has patient had a PCN reaction causing severe rash involving mucus membranes or skin necrosis: No Has patient had a PCN reaction that required hospitalization: No Has patient had a PCN reaction occurring within the last 10 years: No If all of the above answers are "NO", then may proceed with Cephalosporin use.  . Levaquin [Levofloxacin] Itching    VITALS:  Blood pressure 121/83, pulse 92, temperature 97.6 F (36.4 C), temperature source Oral, resp. rate 18, height 6' (1.829 m), weight 74.6 kg, SpO2 98 %.  PHYSICAL EXAMINATION:   Constitutional: Appears well-developed and well-nourished. No distress. HENT: Normocephalic. Marland Kitchen Oropharynx is clear and moist.  Eyes: Conjunctivae and EOM are normal. PERRLA, no scleral icterus.  Neck: Normal ROM. Neck supple. No JVD. No tracheal deviation. CVS: RRR, S1/S2 +, 2/6 murmurs, no gallops, no carotid bruit.  Pulmonary: Effort and breath sounds normal, no stridor, rhonchi, wheezes, rales.  Abdominal: Soft. BS +,  no distension, tenderness, rebound or guarding.  Musculoskeletal: Normal range of motion. No edema and no tenderness.  Neuro: Alert. CN 2-12 grossly intact. No focal deficits. Pt is disoriented. Skin: Skin is warm and dry. No rash noted. Psychiatric: And is confused but moving all 4 limbs.  LABORATORY PANEL:   CBC Recent Labs  Lab 07/04/18 0519  WBC 4.4  HGB 9.0*  HCT 27.3*  PLT 81*    ------------------------------------------------------------------------------------------------------------------  Chemistries  Recent Labs  Lab 07/03/18 0445  07/04/18 1359  NA 127*   < > 125*  K 4.5   < > 4.7  CL 94*   < > 94*  CO2 23   < > 21*  GLUCOSE 142*   < > 213*  BUN 35*   < > 36*  CREATININE 1.19   < > 1.05  CALCIUM 9.2   < > 8.9  MG 2.1  --   --    < > = values in this interval not displayed.   ------------------------------------------------------------------------------------------------------------------  Cardiac Enzymes Recent Labs  Lab 06/29/18 0630 06/30/18 0949 07/01/18 1109  TROPONINI 0.08* 5.23* 2.26*   ------------------------------------------------------------------------------------------------------------------  RADIOLOGY:  Dg Chest Port 1 View  Result Date: 07/03/2018 CLINICAL DATA:  Atelectasis EXAM: PORTABLE CHEST 1 VIEW COMPARISON:  Yesterday FINDINGS: Haziness of the bilateral chest from pleural effusions. Lower lungs are partially obscured. Pulmonary edema. Cardiopericardial enlargement and vascular pedicle widening. Artifact from EKG leads. IMPRESSION: 1. Layering pleural effusions obscuring lower lungs. 2. Pulmonary edema. Electronically Signed   By: Monte Fantasia M.D.   On: 07/03/2018 06:54     ASSESSMENT AND PLAN:   84 year old male with history of severe three-vessel coronary disease and moderate aortic stenosis who presents to the ER due to chest pain with worsening EKG changes in the anterior leads.  1.  Non-ST elevation MI: Patient is status post cardiac catheterization which shows heavily calcified ostial LAD and circumflex not amendable to PCI without atherectomy. Cardiology is recommending medical management Echo- EF 20%. Continue Ranexa  500 mg twice daily in addition to current antianginal medications.  heparin gtt for total 48 hours as per cardiology. Stopped now. Dual antiplatelet therapy with aspirin, Plavix,  isosorbide, lisinopril, Zocor and Coreg  2.  Acute on chronic systolic heart failure with ischemic cardiomyopathy- EF 20-25% Furosemide 20 mg p.o. daily started Follow-up on echocardiogram Continue Coreg and lisinopril  3.  Pneumonia:  cefepime x5 days- stop 07/05/18  4 prerenal azotemia with intravascular volume depletion IV fluids and avoid nephrotoxins and monitor renal function closely Monitor intake and output Improved.  5.  History of MDS: Patient is followed at [facility] Patient currently on heparin drip and cardiology is recommending to discontinue heparin if platelet count falls below 50,000  6.  Constipation: Continue Colace scheduled, Senokot as needed Dulcolax as needed.  Will add MiraLAX on daily basis  7.  Thrombocytopenia platelet count at 74,000 Heparin drip  discontinued after 48 hours.  No active bleeding.  Repeat platelet count in a.m. Patient transferred to telemetry  8.  Hyponatremia This could be due to use of diuretics. Given gentle hydration, not helped much, will give tolvaptan one dose. I discussed this difficult situation with patient's wife and daughter in the room.  Encouraged them make some decisions considering very high chances of worsening cardiac status.  9.  Altered mental status-metabolic encephalopathy This could be due to his overall sickness, receiving morphine and hyponatremia. Follow with IV fluids and improving sodium level. Mental status improved some.   Management plans discussed with the patient and daughter at bedside and they are in agreement.  CODE STATUS: Full  TOTAL TIME TAKING CARE OF THIS PATIENT: 35 minutes.    POSSIBLE D/C 1 to 2 days, DEPENDING ON CLINICAL CONDITION.   Vaughan Basta M.D on 07/04/2018 at 9:17 PM  Between 7am to 6pm - Pager - [phone] After 6pm go to [email or web address] - password [email or web address] [facility]  Office  [phone]  CC: Primary care physician; Zolotor, Audie Clear,  MD  Note: This dictation was prepared with Dragon dictation along with smaller phrase technology. Any transcriptional errors that result from this process are unintentional.

## 2018-07-05 LAB — GLUCOSE, CAPILLARY
GLUCOSE-CAPILLARY: 128 mg/dL — AB (ref 70–99)
GLUCOSE-CAPILLARY: 117 mg/dL — AB (ref 70–99)
Glucose-Capillary: 106 mg/dL — ABNORMAL HIGH (ref 70–99)
Glucose-Capillary: 144 mg/dL — ABNORMAL HIGH (ref 70–99)

## 2018-07-05 LAB — BASIC METABOLIC PANEL
ANION GAP: 9 (ref 5–15)
Anion gap: 9 (ref 5–15)
BUN: 33 mg/dL — AB (ref 8–23)
BUN: 31 mg/dL — ABNORMAL HIGH (ref 8–23)
CALCIUM: 9.3 mg/dL (ref 8.9–10.3)
CHLORIDE: 97 mmol/L — AB (ref 98–111)
CO2: 24 mmol/L (ref 22–32)
CO2: 23 mmol/L (ref 22–32)
CREATININE: 1.14 mg/dL (ref 0.61–1.24)
Calcium: 9.3 mg/dL (ref 8.9–10.3)
Chloride: 101 mmol/L (ref 98–111)
Creatinine, Ser: 1.22 mg/dL (ref 0.61–1.24)
GFR calc Af Amer: 58 mL/min — ABNORMAL LOW (ref >60)
GFR, EST NON AFRICAN AMERICAN: 50 mL/min — AB (ref >60)
GFR, EST NON AFRICAN AMERICAN: 54 mL/min — AB (ref >60)
GLUCOSE: 133 mg/dL — AB (ref 70–99)
Glucose, Bld: 164 mg/dL — ABNORMAL HIGH (ref 70–99)
POTASSIUM: 4.9 mmol/L (ref 3.5–5.1)
Potassium: 4.6 mmol/L (ref 3.5–5.1)
Sodium: 130 mmol/L — ABNORMAL LOW (ref 135–145)
Sodium: 133 mmol/L — ABNORMAL LOW (ref 135–145)

## 2018-07-05 MED ORDER — TOLVAPTAN 15 MG PO TABS
15.0000 mg | ORAL_TABLET | ORAL | Status: DC
  Administered 2018-07-05: 15 mg via ORAL
  Filled 2018-07-05 (×3): qty 1

## 2018-07-05 NOTE — Progress Notes (Signed)
[place] [facility] [place] NAME: Terry Gonzales    MR#:  [id number]  DATE OF BIRTH:  1925-07-13  SUBJECTIVE:   Confusion improved some , Family at bedside . Wife and son at bed side.  Patient did not have a bowel movement for the past 3 days, family before taking him home after bowel movement anticipating tomorrow.  Requesting hospice care at home and EMS transportation  REVIEW OF SYSTEMS:    Review of Systems   Review of Systems  Constitutional: Negative for fever, chills weight loss HENT: Negative for ear pain, nosebleeds, congestion, facial swelling, rhinorrhea, neck pain, neck stiffness and ear discharge.   Respiratory: ++for cough, shortness of breath, NO wheezing  Cardiovascular:B, NO palpitations and leg swelling.  Gastrointestinal: Negative for heartburn, abdominal pain, vomiting, diarrhea or consitpation Genitourinary: Negative for dysuria, urgency, frequency, hematuria Musculoskeletal: Negative for back pain or joint pain Neurological: Negative for dizziness, seizures, syncope, focal weakness,  numbness and headaches.  Hematological: Does not bruise/bleed easily.  Psychiatric/Behavioral: Negative for hallucinations, confusion, dysphoric mood Tolerating Diet: yes.  DRUG ALLERGIES:   Allergies  Allergen Reactions  . Penicillins Swelling    Has patient had a PCN reaction causing immediate rash, facial/tongue/throat swelling, SOB or lightheadedness with hypotension: Yes Has patient had a PCN reaction causing severe rash involving mucus membranes or skin necrosis: No Has patient had a PCN reaction that required hospitalization: No Has patient had a PCN reaction occurring within the last 10 years: No If all of the above answers are "NO", then may proceed with Cephalosporin use.  . Levaquin [Levofloxacin] Itching    VITALS:  Blood pressure 103/70, pulse 78, temperature (!) 97.5 F (36.4 C), temperature source Oral, resp. rate 16, height  6' (1.829 m), weight 74.6 kg, SpO2 99 %.  PHYSICAL EXAMINATION:   Constitutional: Appears well-developed and well-nourished. No distress. HENT: Normocephalic. Marland Kitchen Oropharynx is clear and moist.  Eyes: Conjunctivae and EOM are normal. PERRLA, no scleral icterus.  Neck: Normal ROM. Neck supple. No JVD. No tracheal deviation. CVS: RRR, S1/S2 +, 2/6 murmurs, no gallops, no carotid bruit.  Pulmonary: Effort and breath sounds normal, no stridor, rhonchi, wheezes, rales.  Abdominal: Soft. BS +,  no distension, tenderness, rebound or guarding.  Musculoskeletal: Normal range of motion. No edema and no tenderness.  Neuro: Alert. CN 2-12 grossly intact. No focal deficits. Pt is disoriented. Skin: Skin is warm and dry. No rash noted. Psychiatric: And is confused but moving all 4 limbs.  LABORATORY PANEL:   CBC Recent Labs  Lab 07/04/18 0519  WBC 4.4  HGB 9.0*  HCT 27.3*  PLT 81*   ------------------------------------------------------------------------------------------------------------------  Chemistries  Recent Labs  Lab 07/03/18 0445  07/05/18 0627  NA 127*   < > 130*  K 4.5   < > 4.9  CL 94*   < > 97*  CO2 23   < > 24  GLUCOSE 142*   < > 133*  BUN 35*   < > 33*  CREATININE 1.19   < > 1.22  CALCIUM 9.2   < > 9.3  MG 2.1  --   --    < > = values in this interval not displayed.   ------------------------------------------------------------------------------------------------------------------  Cardiac Enzymes Recent Labs  Lab 06/29/18 0630 06/30/18 0949 07/01/18 1109  TROPONINI 0.08* 5.23* 2.26*   ------------------------------------------------------------------------------------------------------------------  RADIOLOGY:  No results found.   ASSESSMENT AND PLAN:   84 year old male with history of severe three-vessel coronary disease  and moderate aortic stenosis who presents to the ER due to chest pain with worsening EKG changes in the anterior leads.  1.  Non-ST  elevation MI: Patient is status post cardiac catheterization which shows heavily calcified ostial LAD and circumflex not amendable to PCI without atherectomy. Cardiology is recommending medical management Echo- EF 20%. Continue Ranexa 500 mg twice daily in addition to current antianginal medications.  heparin gtt for total 48 hours as per cardiology. Stopped now. Dual antiplatelet therapy with aspirin, Plavix, isosorbide, lisinopril, Zocor and Coreg  2.  Acute on chronic systolic heart failure with ischemic cardiomyopathy- EF 20-25% Furosemide 20 mg p.o. daily  Continue Coreg and lisinopril  3.  Pneumonia:  cefepime x5 days- stop 07/05/18  4 prerenal azotemia with intravascular volume depletion IV fluids and avoid nephrotoxins and monitor renal function closely Monitor intake and output Improved.  5.  History of MDS: Patient is [facility] at [facility] Patient currently on heparin drip and cardiology is recommending to discontinue heparin if platelet count falls below 50,000  6.  Constipation: Continue Colace scheduled, Senokot  Dulcolax as needed.   MiraLAX on daily basis Soapsuds enema given today  7.  Thrombocytopenia  Heparin drip  discontinued after 48 hours.  No active bleeding.  Repeat platelet count in a.m. Patient transferred to telemetry  8.  Hyponatremia-sodium at 130 This could be due to use of diuretics. Given gentle hydration, not helped much, will continue tolvaptan  9.  Altered mental status-metabolic encephalopathy Mentating much better   Management plans discussed with the patient and son, grandson and wife at bedside at bedside and they are in agreement. They are aware that patient is not a good Candidate for bypass surgery.  Agreeable with hospice care at home and leaning towards comfort care soon  Plan is to discharge patient home with hospice care and eventual transition to comfort care depending on clinical situation.  High morbidity and mortality with high  risk for cardiac arrest  CODE STATUS: DNR  TOTAL TIME TAKING CARE OF THIS PATIENT: 35 minutes.    POSSIBLE D/C 1 to 2 days, DEPENDING ON CLINICAL CONDITION.   Nicholes Mango M.D on 07/05/2018 at 6:50 PM  Between 7am to 6pm - Pager - [phone] After 6pm go to [email or web address] - password [email or web address]  Office  [phone]  CC: Primary care physician; Zolotor, Audie Clear, MD  Note: This dictation was prepared with Dragon dictation along with smaller phrase technology. Any transcriptional errors that result from this process are unintentional.

## 2018-07-05 NOTE — Plan of Care (Signed)
Patient has a stage 1 pressure ulcer on his sacral, initiated q2h turns and applied sacral dressing. No complaints of chest pain.

## 2018-07-05 NOTE — Plan of Care (Signed)
  Problem: Safety: Goal: Ability to remain free from injury will improve Outcome: Progressing   Problem: Pain Managment: Goal: General experience of comfort will improve Outcome: Progressing   

## 2018-07-06 ENCOUNTER — Inpatient Hospital Stay: Payer: Medicare Other

## 2018-07-06 LAB — BASIC METABOLIC PANEL
ANION GAP: 10 (ref 5–15)
BUN: 30 mg/dL — AB (ref 8–23)
CALCIUM: 9.4 mg/dL (ref 8.9–10.3)
CO2: 24 mmol/L (ref 22–32)
Chloride: 103 mmol/L (ref 98–111)
Creatinine, Ser: 1.14 mg/dL (ref 0.61–1.24)
GFR calc Af Amer: >60 mL/min (ref >60)
GFR calc non Af Amer: 54 mL/min — ABNORMAL LOW (ref >60)
GLUCOSE: 107 mg/dL — AB (ref 70–99)
POTASSIUM: 4.7 mmol/L (ref 3.5–5.1)
Sodium: 137 mmol/L (ref 135–145)

## 2018-07-06 LAB — GLUCOSE, CAPILLARY
Glucose-Capillary: 98 mg/dL (ref 70–99)
Glucose-Capillary: 128 mg/dL — ABNORMAL HIGH (ref 70–99)

## 2018-07-06 LAB — SODIUM: Sodium: 136 mmol/L (ref 135–145)

## 2018-07-06 MED ORDER — RANOLAZINE ER 500 MG PO TB12: 500.0000 mg | ORAL_TABLET | Freq: Two times a day (BID) | ORAL | 0 refills | Status: AC

## 2018-07-06 MED ORDER — FLEET ENEMA 7-19 GM/118ML RE ENEM
1.0000 | ENEMA | Freq: Once | RECTAL | Status: AC
  Administered 2018-07-06: 1 via RECTAL

## 2018-07-06 MED ORDER — ACETAMINOPHEN 325 MG PO TABS: 650.0000 mg | ORAL_TABLET | ORAL | Status: AC | PRN

## 2018-07-06 MED ORDER — POLYETHYLENE GLYCOL 3350 17 G PO PACK: 17.0000 g | PACK | Freq: Every day | ORAL | 0 refills | Status: AC

## 2018-07-06 MED ORDER — DOCUSATE SODIUM 100 MG PO CAPS: 100.0000 mg | ORAL_CAPSULE | Freq: Every day | ORAL | Status: AC

## 2018-07-06 MED ORDER — BISACODYL 5 MG PO TBEC: 5.0000 mg | DELAYED_RELEASE_TABLET | Freq: Every day | ORAL | 0 refills | Status: AC | PRN

## 2018-07-06 NOTE — Discharge Summary (Signed)
[place] [facility] [place] NAME: Gar Glance    MR#:  [id number]  DATE OF BIRTH:  1925-01-14  DATE OF ADMISSION:  06/29/2018 ADMITTING PHYSICIAN: Bettey Costa, MD  DATE OF DISCHARGE:07/06/18  PRIMARY CARE PHYSICIAN: Zolotor, Audie Clear, MD    ADMISSION DIAGNOSIS:  ST elevation myocardial infarction (STEMI), unspecified artery (HCC) [I21.3] Non-ST elevation (NSTEMI) myocardial infarction ([place]) [I21.4]  DISCHARGE DIAGNOSIS:  Non-STEMI Discharge home with hospice care SECONDARY DIAGNOSIS:   Past Medical History:  Diagnosis Date  . Aortic stenosis   . CAD (coronary artery disease)    a. NSTEMI 1/19 - [facility] 1/19: ostial LAD 75%, proximal LAD 80%, ostial LCx 75%, mid LCX 75%, OM2 80%, ostial RCA 99%, mid RCA 95%, distal RCA 99%, med Rx  . Diabetes mellitus without complication ([place])   . Hypertension   . Ischemic cardiomyopathy    a. TTE 1/19: EF of 30-35%, hypokinesis of the anterior, anteroseptal, and apical myocardium, Gr1DD, moderate aortic stenosis (may be under estimated due to his cardiomyopathy), RV systolic function normal, left atrium normal size, unable to estimate PASP  . Myelodysplastic syndrome [facility])     HOSPITAL COURSE:   HPI  Connell Bognar  is a 84 y.o. male with a known history of severe three-vessel coronary artery disease who presented to the emergency room due to chest pain and was sent to cardiac catheterization lab for evaluation of the possibility of ST elevation MI.   1.  Non-ST elevation MI: Patient is status post cardiac catheterization which shows heavily calcified ostial LAD and circumflex not amendable to PCI without atherectomy. Cardiology is recommending medical management Echo- EF 20%. Continue Ranexa 500 mg twice daily in addition to current antianginal medications.  heparin gtt given for total 48 hours as per cardiology.  Heparin drip discontinued  dual antiplatelet therapy with aspirin, Plavix,  isosorbide, Zocor and Coreg .  Lisinopril held in view of low normal blood pressure STEMI ruled out by cardiology Patient is not a candidate for cardiac rehabilitation as he is going home with hospice care and will be changed to comfort care soon as reported by the family  2.  Acute on chronic systolic heart failure with ischemic cardiomyopathy- EF 20-25% Furosemide 20 mg p.o. daily  Continue Coreg and lisinopril  3.  Pneumonia:  cefepime x5 days-clinically improved and antibiotics discontinued after the course  4 prerenal azotemia with intravascular volume depletion IV fluids and avoid nephrotoxins and monitor renal function closely Monitor intake and output Improved.  5.  History of MDS: Patient is followed at [facility] Patient currently on heparin drip and cardiology is recommending to discontinue heparin if platelet count falls below 50,000  6.  Constipation: Continue Colace scheduled, Senokot  Dulcolax as needed.   MiraLAX on daily basis Soapsuds enema given yesterday and fleets enema today and patient had a large bowel movement Continue MiraLAX and Colace scheduled on daily basis and Dulcolax as needed  7.  Thrombocytopenia  Heparin drip  discontinued after 48 hours.  No active bleeding.  Repeat platelet count in a.m. 81,000 improving Patient transferred to telemetry  8.  Hyponatremia- Resolved sodium at 137 This could be due to use of diuretics. Given gentle hydration, not helped much, will  discontinue tolvaptan  9.  Altered mental status-metabolic encephalopathy Mentating much better  Discharge home with hospice care.  Social worker will arrange transportation Plan of care discussed with the patient, daughter and son at bedside they are agreeable  DISCHARGE CONDITIONS:   fair  CONSULTS OBTAINED:  Treatment Team:  Cammie Sickle, MD Sindy Guadeloupe, MD   PROCEDURES cardiac cath  DRUG ALLERGIES:   Allergies  Allergen Reactions  . Penicillins  Swelling    Has patient had a PCN reaction causing immediate rash, facial/tongue/throat swelling, SOB or lightheadedness with hypotension: Yes Has patient had a PCN reaction causing severe rash involving mucus membranes or skin necrosis: No Has patient had a PCN reaction that required hospitalization: No Has patient had a PCN reaction occurring within the last 10 years: No If all of the above answers are "NO", then may proceed with Cephalosporin use.  . Levaquin [Levofloxacin] Itching    DISCHARGE MEDICATIONS:   Allergies as of 07/06/2018      Reactions   Penicillins Swelling   Has patient had a PCN reaction causing immediate rash, facial/tongue/throat swelling, SOB or lightheadedness with hypotension: Yes Has patient had a PCN reaction causing severe rash involving mucus membranes or skin necrosis: No Has patient had a PCN reaction that required hospitalization: No Has patient had a PCN reaction occurring within the last 10 years: No If all of the above answers are "NO", then may proceed with Cephalosporin use.   Levaquin [levofloxacin] Itching      Medication List    STOP taking these medications   lisinopril 10 MG tablet Commonly known as:  PRINIVIL,ZESTRIL     TAKE these medications   acetaminophen 325 MG tablet Commonly known as:  TYLENOL Take 2 tablets (650 mg total) by mouth every 4 (four) hours as needed for headache or mild pain.   albuterol 108 (90 Base) MCG/ACT inhaler Commonly known as:  PROVENTIL HFA;VENTOLIN HFA Inhale 2 puffs into the lungs every 6 (six) hours as needed for wheezing or shortness of breath.   aspirin 81 MG EC tablet Take 1 tablet (81 mg total) by mouth daily.   bisacodyl 5 MG EC tablet Commonly known as:  DULCOLAX Take 1 tablet (5 mg total) by mouth daily as needed for moderate constipation.   carvedilol 3.125 MG tablet Commonly known as:  COREG Take 1 tablet (3.125 mg total) by mouth 2 (two) times daily.   clopidogrel 75 MG  tablet Commonly known as:  PLAVIX Take 1 tablet (75 mg total) by mouth daily.   Darbepoetin Alfa 500 MCG/ML Sosy injection Commonly known as:  ARANESP Inject 500 mcg into the skin every 21 ( twenty-one) days.   docusate sodium 100 MG capsule Commonly known as:  COLACE Take 1 capsule (100 mg total) by mouth at bedtime.   fluticasone 50 MCG/ACT nasal spray Commonly known as:  FLONASE Place 2 sprays into the nose daily.   isosorbide mononitrate 30 MG 24 hr tablet Commonly known as:  IMDUR Take 1 tablet (30 mg total) by mouth 2 (two) times daily.   morphine CONCENTRATE 10 MG/0.5ML Soln concentrated solution Take 0.25 mLs (5 mg total) by mouth every 2 (two) hours as needed for moderate pain, severe pain or shortness of breath.   MULTI-VITAMINS Tabs Take 0.5 tablets by mouth daily.   nitroGLYCERIN 0.4 MG SL tablet Commonly known as:  NITROSTAT Place 1 tablet (0.4 mg total) under the tongue every 5 (five) minutes as needed for chest pain.   polyethylene glycol packet Commonly known as:  MIRALAX / GLYCOLAX Take 17 g by mouth daily. Start taking on:  07/07/2018   PRESERVISION AREDS PO Take 1 tablet by mouth 2 (two) times daily.   ranolazine 500  MG 12 hr tablet Commonly known as:  RANEXA Take 1 tablet (500 mg total) by mouth 2 (two) times daily.   senna-docusate 8.6-50 MG tablet Commonly known as:  Senokot-S Take 2 tablets by mouth daily as needed for mild constipation.   simvastatin 10 MG tablet Commonly known as:  ZOCOR Take 10 mg by mouth at bedtime.        DISCHARGE INSTRUCTIONS:   Follow-up with primary care physician in a week Continue hospice care at home Follow-up with cardiology as needed  DIET:  Cardiac diet  DISCHARGE CONDITION:  Fair  ACTIVITY:  Activity as tolerated  OXYGEN:  Home Oxygen: Yes.     Oxygen Delivery: 2 liters/min via Patient connected to nasal cannula oxygen  DISCHARGE LOCATION:  home   If you experience worsening of your  admission symptoms, develop shortness of breath, life threatening emergency, suicidal or homicidal thoughts you must seek medical attention immediately by calling 911 or calling your MD immediately  if symptoms less severe.  You Must read complete instructions/literature along with all the possible adverse reactions/side effects for all the Medicines you take and that have been prescribed to you. Take any new Medicines after you have completely understood and accpet all the possible adverse reactions/side effects.   Please note  You were cared for by a hospitalist during your hospital stay. If you have any questions about your discharge medications or the care you received while you were in the hospital after you are discharged, you can call the unit and asked to speak with the hospitalist on call if the hospitalist that took care of you is not available. Once you are discharged, your primary care physician will handle any further medical issues. Please note that NO REFILLS for any discharge medications will be authorized once you are discharged, as it is imperative that you return to your primary care physician (or establish a relationship with a primary care physician if you do not have one) for your aftercare needs so that they can reassess your need for medications and monitor your lab values.     Today  Chief Complaint  Patient presents with  . Chest Pain   Patient is doing much better.  Denies any chest pain or abdominal pain or shortness of breath.  Had a large bowel movement after Fleet enema today.  Son and daughter at bedside and want to take him home via EMS transportation  ROS:  CONSTITUTIONAL: Denies fevers, chills. Denies any fatigue, weakness.  EYES: Denies blurry vision, double vision, eye pain. EARS, NOSE, THROAT: Denies tinnitus, ear pain, hearing loss. RESPIRATORY: Denies cough, wheeze, shortness of breath.  CARDIOVASCULAR: Denies chest pain, palpitations, edema.   GASTROINTESTINAL: Denies nausea, vomiting, diarrhea, abdominal pain. Denies bright red blood per rectum. GENITOURINARY: Denies dysuria, hematuria. ENDOCRINE: Denies nocturia or thyroid problems. HEMATOLOGIC AND LYMPHATIC: Denies easy bruising or bleeding. SKIN: Denies rash or lesion. MUSCULOSKELETAL: Denies pain in neck, back, shoulder, knees, hips or arthritic symptoms.  NEUROLOGIC: Denies paralysis, paresthesias.  PSYCHIATRIC: Denies anxiety or depressive symptoms.   VITAL SIGNS:  Blood pressure 112/77, pulse 73, temperature (!) 97.5 F (36.4 C), temperature source Oral, resp. rate 14, height 6' (1.829 m), weight 74.6 kg, SpO2 97 %.  I/O:    Intake/Output Summary (Last 24 hours) at 07/06/2018 1219 Last data filed at 07/06/2018 0700 Gross per 24 hour  Intake 1247.94 ml  Output 1350 ml  Net -102.06 ml    PHYSICAL EXAMINATION:  GENERAL:  84 y.o.-year-old patient  lying in the bed with no acute distress.  EYES: Pupils equal, round, reactive to light and accommodation. No scleral icterus. Extraocular muscles intact.  HEENT: Head atraumatic, normocephalic. Oropharynx and nasopharynx clear.  NECK:  Supple, no jugular venous distention. No thyroid enlargement, no tenderness.  LUNGS: Normal breath sounds bilaterally, no wheezing, rales,rhonchi or crepitation. No use of accessory muscles of respiration.  CARDIOVASCULAR: S1, S2 normal. No murmurs, rubs, or gallops.  ABDOMEN: Soft, non-tender, non-distended. Bowel sounds present. No organomegaly or mass.  EXTREMITIES: No pedal edema, cyanosis, or clubbing.  NEUROLOGIC: Awake, alert and oriented x3. Sensation intact. Gait not checked.  PSYCHIATRIC: The patient is alert and oriented x 3.  SKIN: No obvious rash, lesion, or ulcer.   DATA REVIEW:   CBC Recent Labs  Lab 07/04/18 0519  WBC 4.4  HGB 9.0*  HCT 27.3*  PLT 81*    Chemistries  Recent Labs  Lab 07/03/18 0445  07/06/18 0431  NA 127*   < > 137  K 4.5   < > 4.7  CL  94*   < > 103  CO2 23   < > 24  GLUCOSE 142*   < > 107*  BUN 35*   < > 30*  CREATININE 1.19   < > 1.14  CALCIUM 9.2   < > 9.4  MG 2.1  --   --    < > = values in this interval not displayed.    Cardiac Enzymes Recent Labs  Lab 07/01/18 1109  TROPONINI 2.26*    Microbiology Results  Results for orders placed or performed during the hospital encounter of 06/29/18  MRSA PCR Screening     Status: Abnormal   Collection Time: 06/29/18  8:41 AM  Result Value Ref Range Status   MRSA by PCR POSITIVE (A) NEGATIVE Final    Comment:        The GeneXpert MRSA Assay (FDA approved for NASAL specimens only), is one component of a comprehensive MRSA colonization surveillance program. It is not intended to diagnose MRSA infection nor to guide or monitor treatment for MRSA infections. RESULT CALLED TO, READ BACK BY AND VERIFIED WITH: CHERYL GREEN AT 1000 ON 06/29/18 BY SNJ Performed at [facility], [address] [place], [place] [address]     RADIOLOGY:  Dg Chest Port 1 View  Result Date: 07/03/2018 CLINICAL DATA:  Atelectasis EXAM: PORTABLE CHEST 1 VIEW COMPARISON:  Yesterday FINDINGS: Haziness of the bilateral chest from pleural effusions. Lower lungs are partially obscured. Pulmonary edema. Cardiopericardial enlargement and vascular pedicle widening. Artifact from EKG leads. IMPRESSION: 1. Layering pleural effusions obscuring lower lungs. 2. Pulmonary edema. Electronically Signed   By: Monte Fantasia M.D.   On: 07/03/2018 06:54    EKG:   Orders placed or performed during the hospital encounter of 06/29/18  . ED EKG within 10 minutes  . ED EKG within 10 minutes      Management plans discussed with the patient, family and they are in agreement.  CODE STATUS:     Code Status Orders  (From admission, onward)         Start     Ordered   07/03/18 1308  Do not attempt resuscitation (DNR)  Continuous    Question Answer Comment  In the event of cardiac or  respiratory ARREST Do not call a "code blue"   In the event of cardiac or respiratory ARREST Do not perform Intubation, CPR, defibrillation or ACLS   In the event  of cardiac or respiratory ARREST Use medication by any route, position, wound care, and other measures to relive pain and suffering. May use oxygen, suction and manual treatment of airway obstruction as needed for comfort.      07/03/18 1307        Code Status History    Date Active Date Inactive Code Status Order ID Comments User Context   06/29/2018 0833 07/03/2018 1307 Full Code [id number]  Wellington Hampshire, MD Inpatient   09/01/2017 0257 09/02/2017 1546 DNR [id number]  Saundra Shelling, MD Inpatient   09/01/2017 0116 09/01/2017 0257 DNR [id number]  Nelva Bush, MD ED   08/14/2017 2212 08/19/2017 2055 Full Code [id number]  Amelia Jo, MD Inpatient    Advance Directive Documentation     Most Recent Value  Type of Advance Directive  Living will  Pre-existing out of facility DNR order (yellow form or pink MOST form)  -  "MOST" Form in Place?  -      TOTAL TIME TAKING CARE OF THIS PATIENT: 45  minutes.   Note: This dictation was prepared with Dragon dictation along with smaller phrase technology. Any transcriptional errors that result from this process are unintentional.   @[facility] @  on 07/06/2018 at 12:19 PM  Between 7am to 6pm - Pager - [phone]  After 6pm go to [email or web address] - password [email or web address] [facility]  Office  [phone]  CC: Primary care physician; Zolotor, Audie Clear, MD

## 2018-07-06 NOTE — Discharge Instructions (Signed)
Follow-up with primary care physician in a week Continue hospice care at home Follow-up with cardiology as needed.  Reposition him through out the day so that he isn't sitting on his tail bone all day.

## 2018-07-06 NOTE — Care Management Note (Signed)
Case Management Note  Patient Details  Name: Terry Gonzales MRN: [id number] Date of Birth: 1924/11/25  Subjective/Objective:    Previous RNCM had completed workup for home with hospice. Delay was only r/t pt having a BM. DME was delivered. EMS for transport. Faxed d/c info to hospice of [facility]                 Action/Plan:   Expected Discharge Date:  07/06/18               Expected Discharge Plan:  Home w Hospice Care  In-House Referral:     Discharge planning Services  CM Consult  Post Acute Care Choice:    Choice offered to:     DME Arranged:    DME Agency:     HH Arranged:    [place] Agency:     Status of Service:  Completed, signed off  If discussed at [employer], dates discussed:    Additional Comments:  Latanya Maudlin, RN 07/06/2018, 2:55 PM

## 2018-07-06 NOTE — Progress Notes (Signed)
Patient discharged to home via EMS.  Home hospice care has been set up for him.  His daughter took his prescriptions to the pharmacy earlier today.

## 2018-08-13 DEATH — deceased

## 2018-08-15 ENCOUNTER — Encounter: Payer: Self-pay | Admitting: Internal Medicine

## 2018-10-01 ENCOUNTER — Ambulatory Visit: Payer: Medicare Other | Admitting: Cardiovascular Disease

## 2019-03-02 IMAGING — CR DG CHEST 2V
1 series · 3 of 3 positions shown · non-contrast
Comparison: 10/30/2014, 05/27/2014

CLINICAL DATA: [AGE] male with a history of shortness of
breath

EXAM:
CHEST  2 VIEW

[Series 1: dg chest 2 view · 0.14mm/px · 3 of 3 slices shown]
[im 1/3]
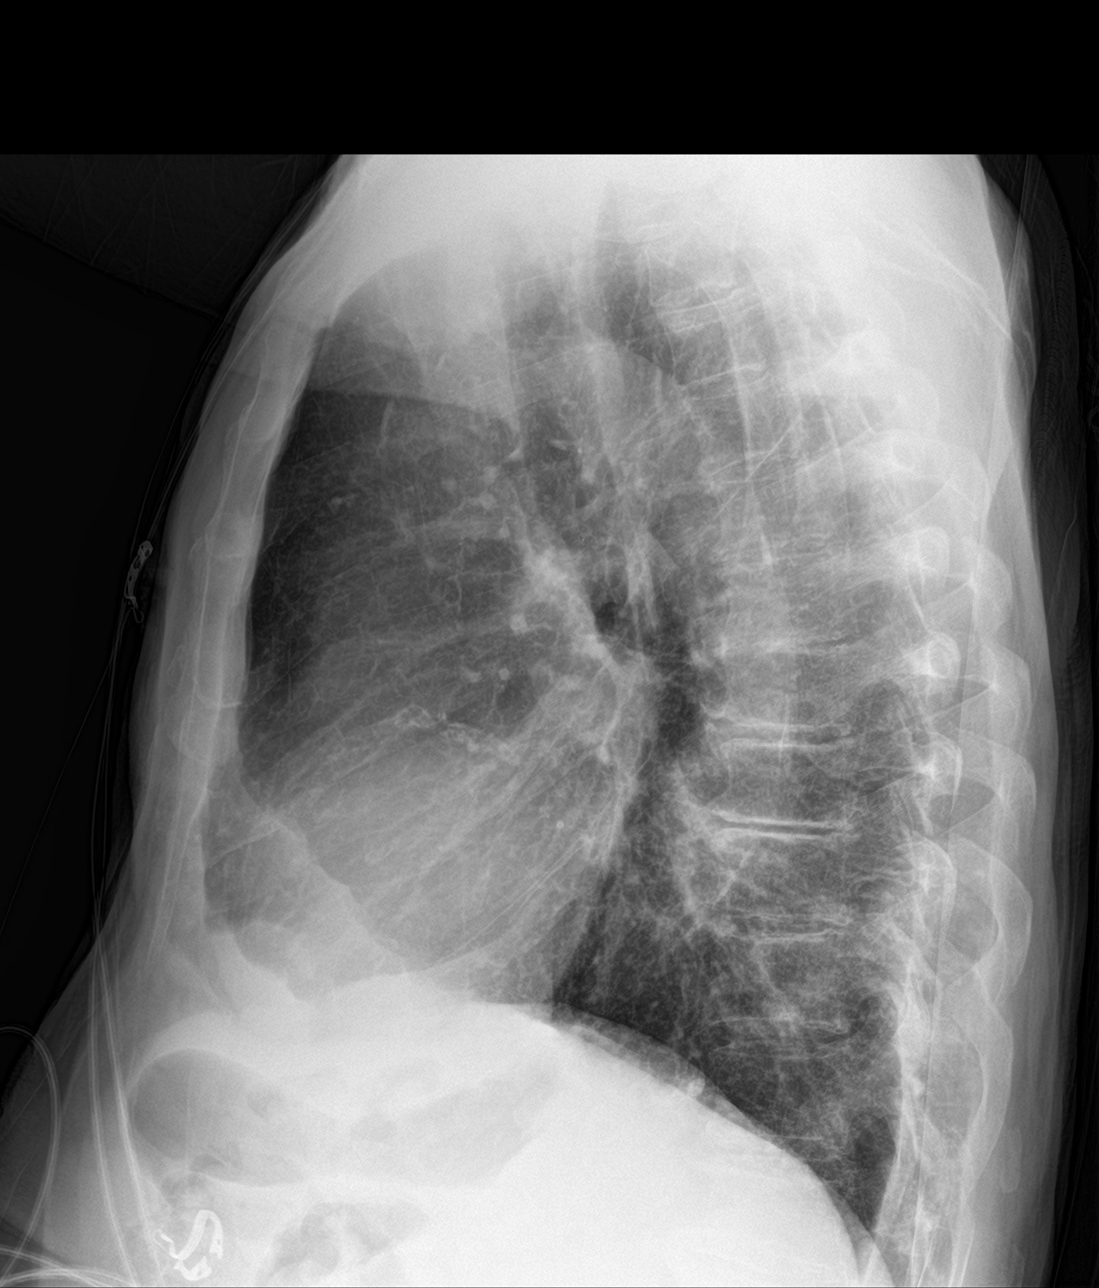
[im 2/3]
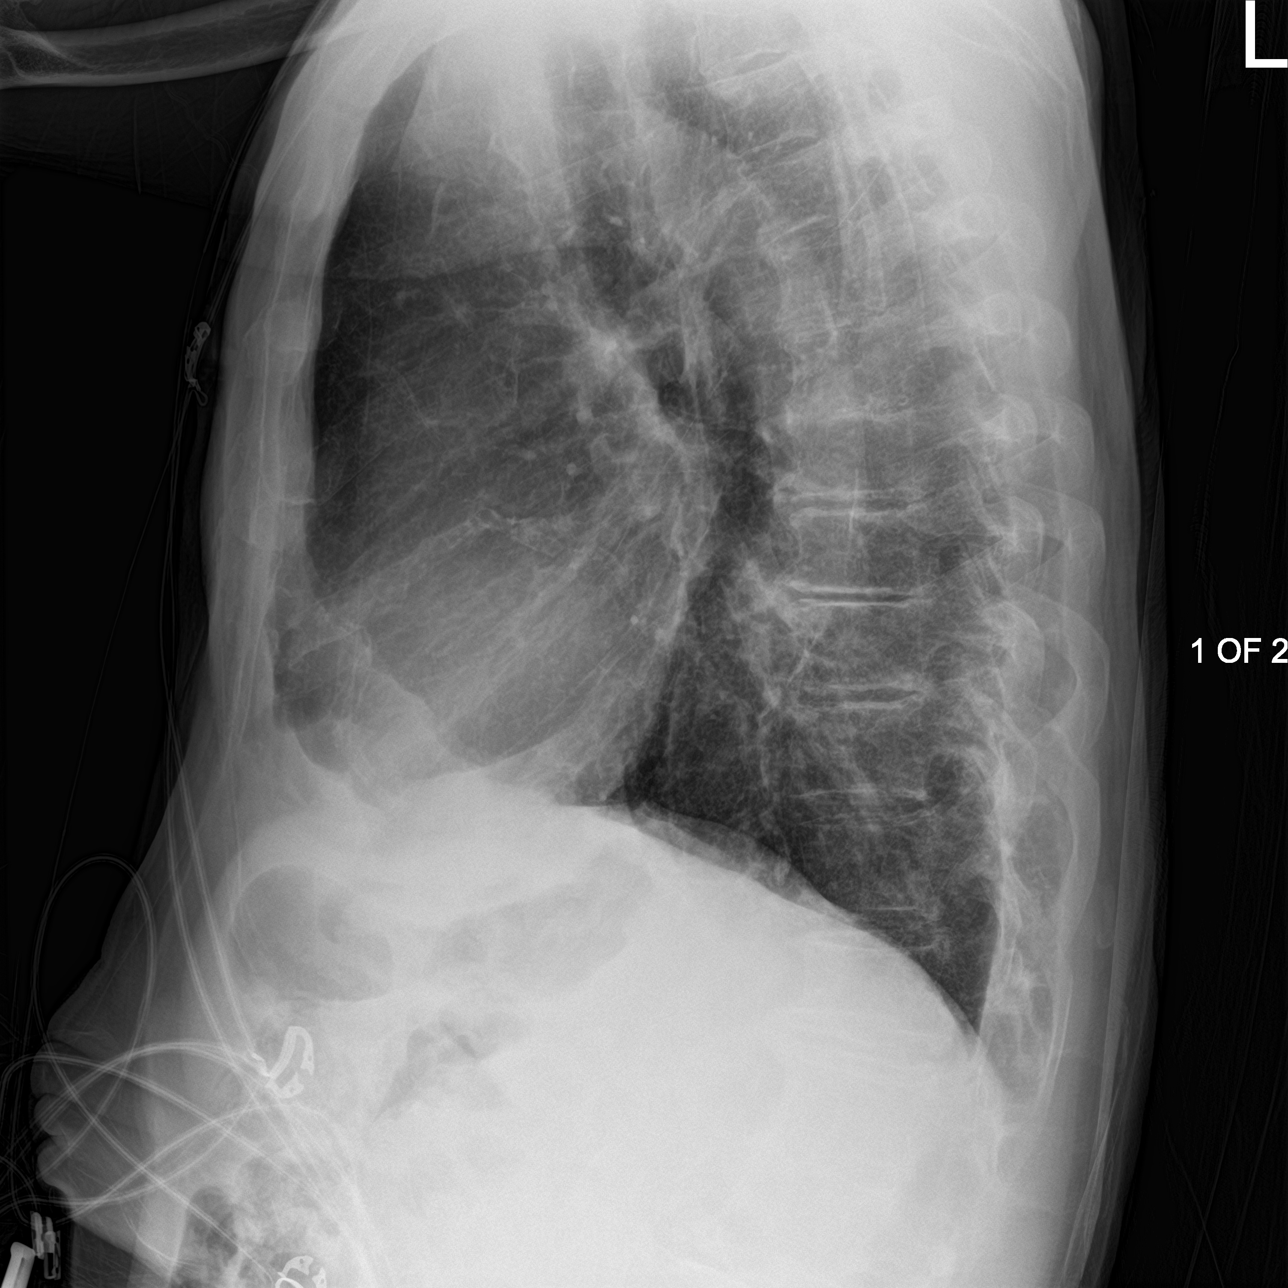
[im 3/3]
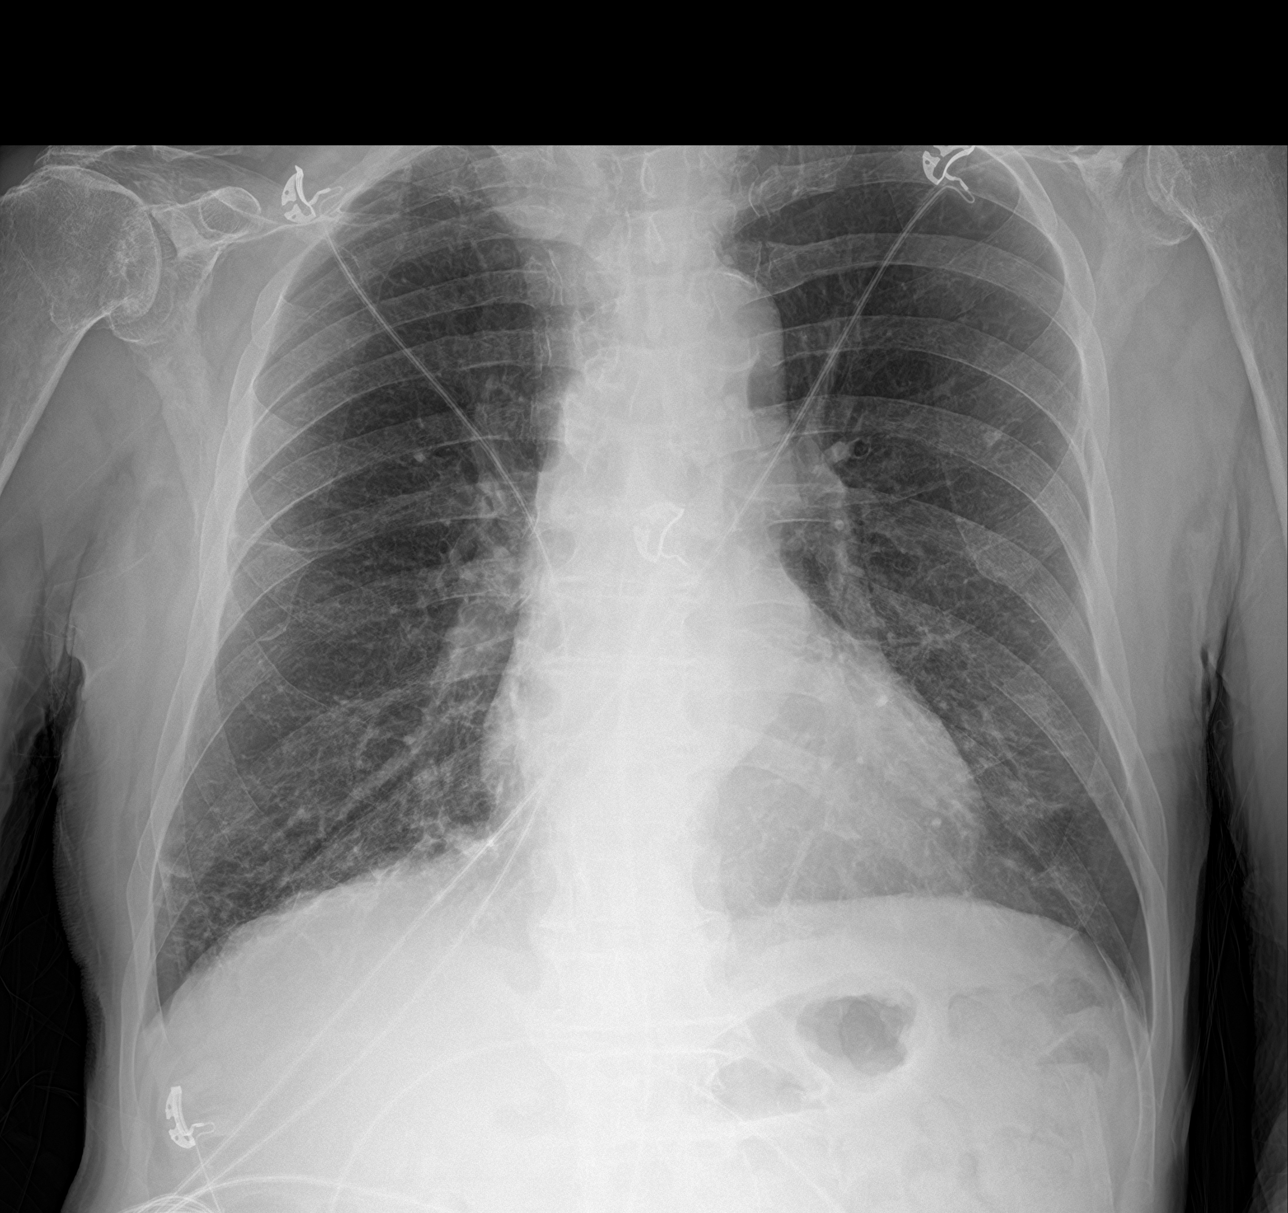

[3 of 3 positions shown; findings below may reference images not displayed]

FINDINGS: Cardiomediastinal silhouette unchanged in size and contour. No
evidence of central vascular congestion. Compare to the prior there
is increased interstitial opacities, particularly at the right lung
base.

No pneumothorax. No confluent airspace disease. No pleural effusion.

Coronary calcifications.
IMPRESSION: Coarse interstitial markings may reflect either early edema, chronic
scarring, or potentially atypical infection. No evidence of lobar
pneumonia.

## 2019-03-03 IMAGING — CR DG CHEST 2V
1 series · 2 of 2 positions shown · non-contrast
Comparison: Chest radiograph and chest CT August 13, 2017

CLINICAL DATA: Shortness of breath and cough

EXAM:
CHEST  2 VIEW

[Series 1: dg chest 2 view · 0.14mm/px · 2 of 2 slices shown]
[im 1/2]
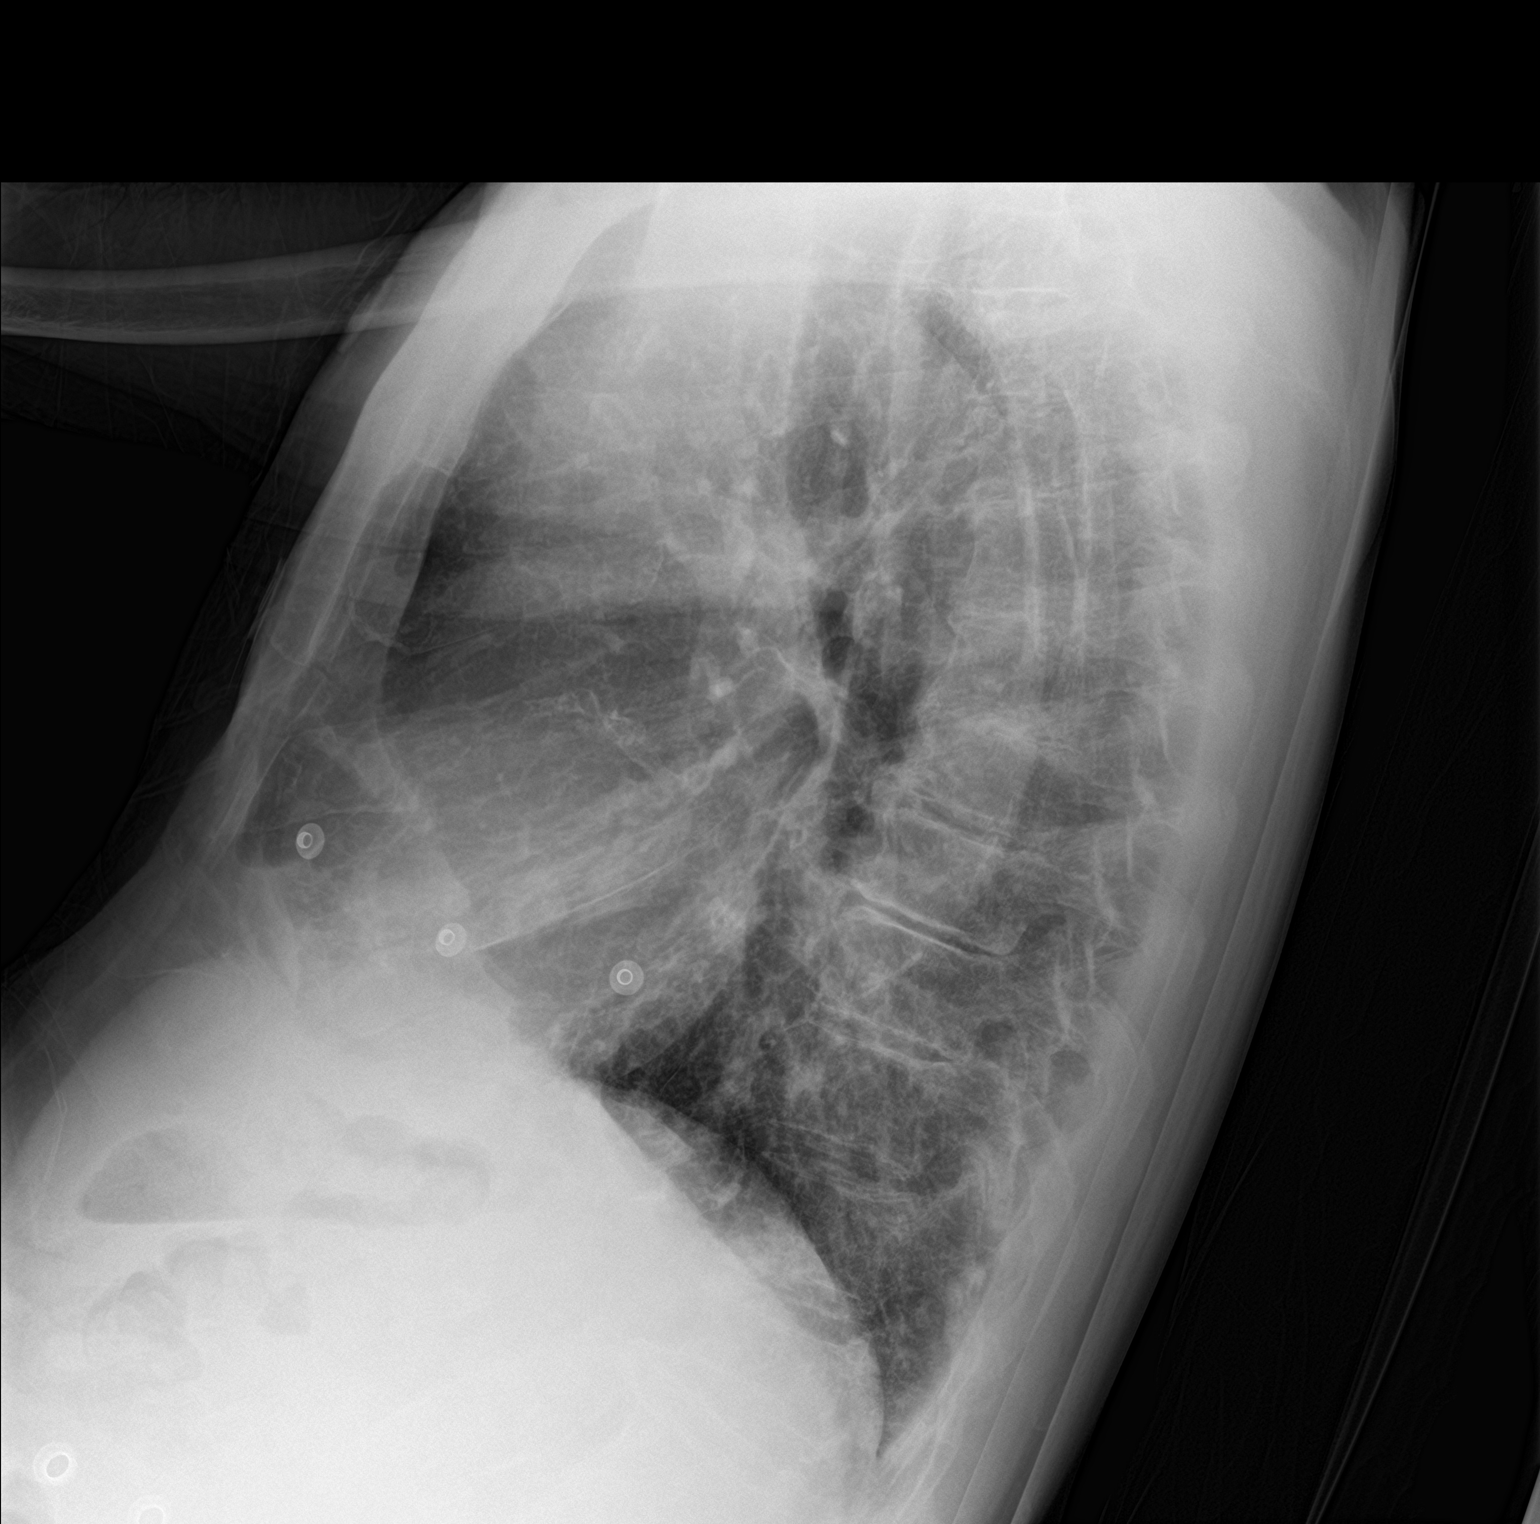
[im 2/2]
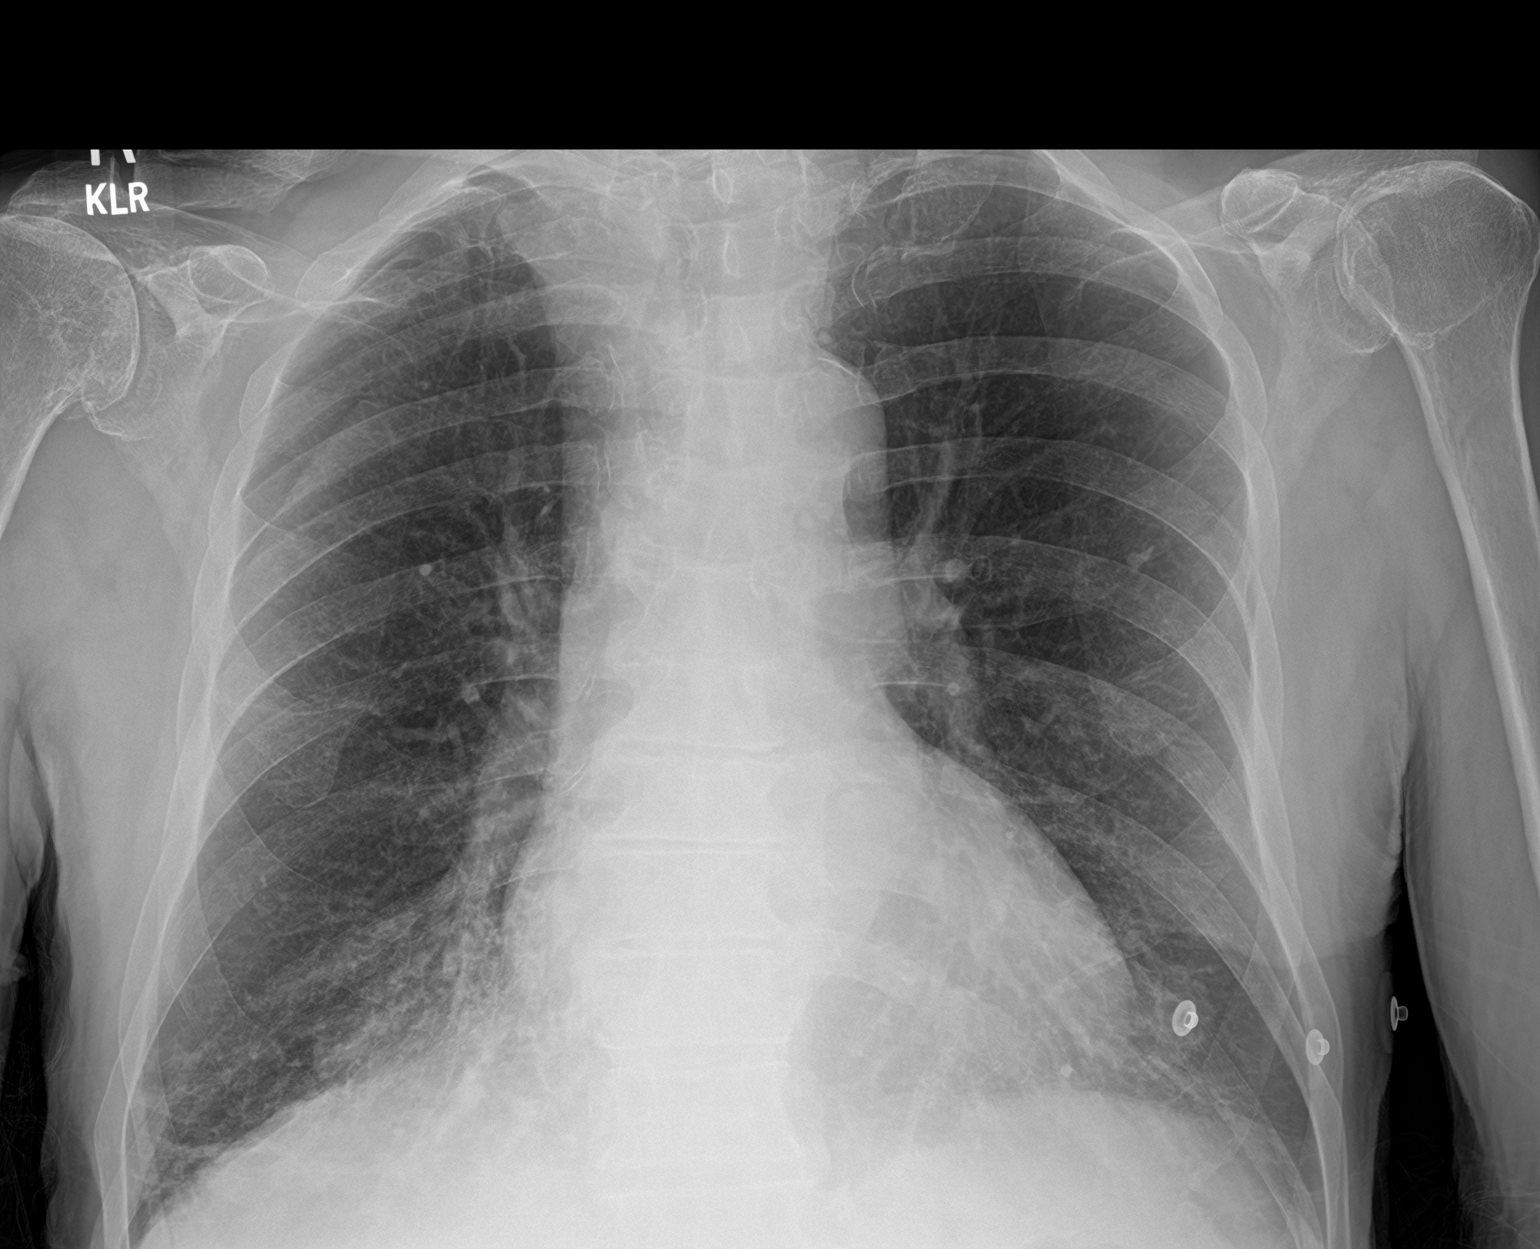

[2 of 2 positions shown; findings below may reference images not displayed]

FINDINGS: There is stable patchy opacity in the lung bases, somewhat more on
the right than on the left. No new opacity evident elsewhere. Heart
is upper normal in size with pulmonary vascularity within normal
limits. There is aortic atherosclerosis. There is an old healed
fracture of the right clavicle. There is calcification in the left
anterior descending and left circumflex coronary artery.
IMPRESSION: Patchy bibasilar opacity, at least in part due to atelectasis but
based on CT appearance, likely with associated pneumonia and/ or
aspiration. Lungs elsewhere clear. Stable cardiac silhouette. There
is aortic atherosclerosis. Foci of coronary artery calcification
also noted.

Aortic Atherosclerosis ([id number]).

## 2019-03-13 IMAGING — CR DG CHEST 2V
3 series · 3 of 3 positions shown · non-contrast
Comparison: 08/16/2017

CLINICAL DATA: Shortness of breath, cough

EXAM:
CHEST  2 VIEW

[chest pa]
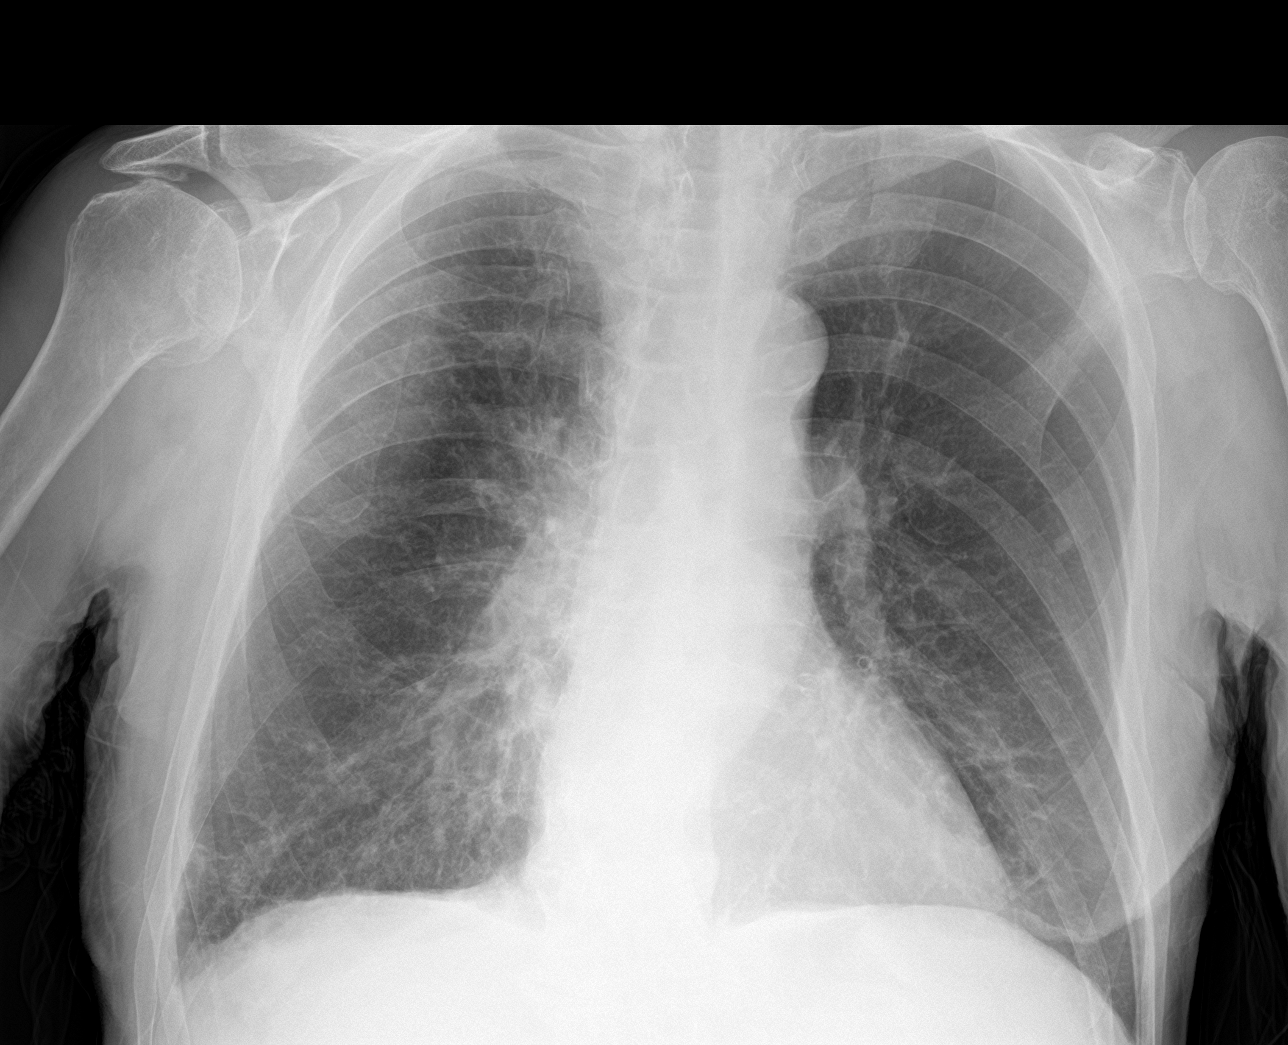

[chest lat]
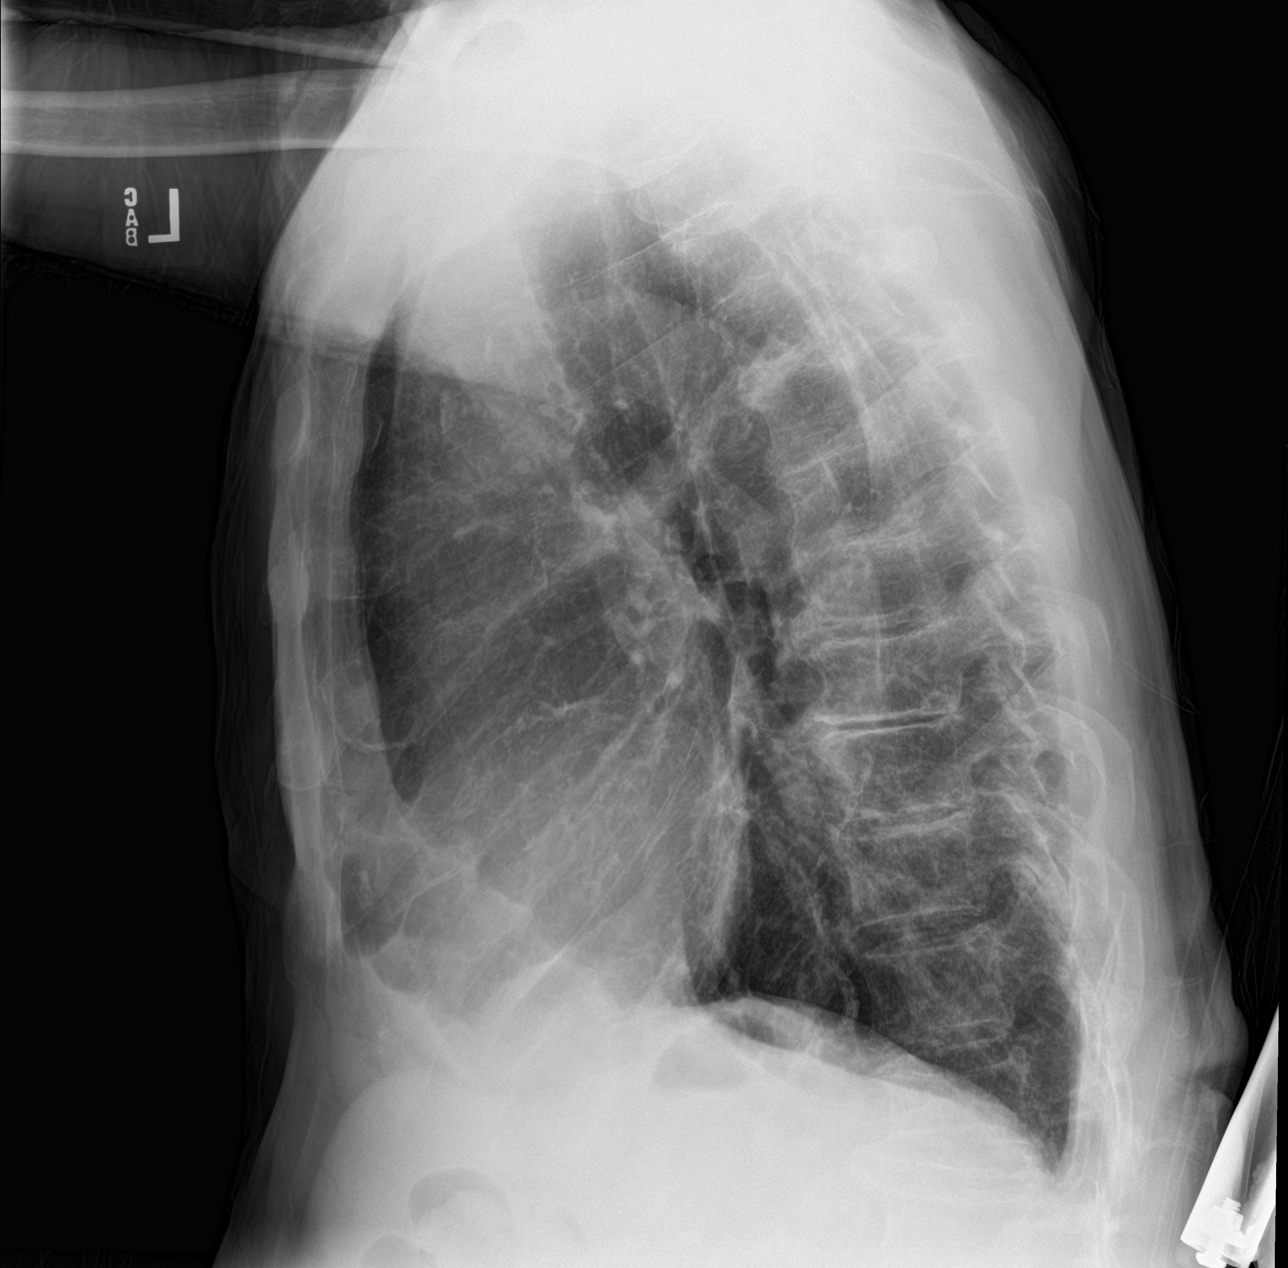

[chest ap]
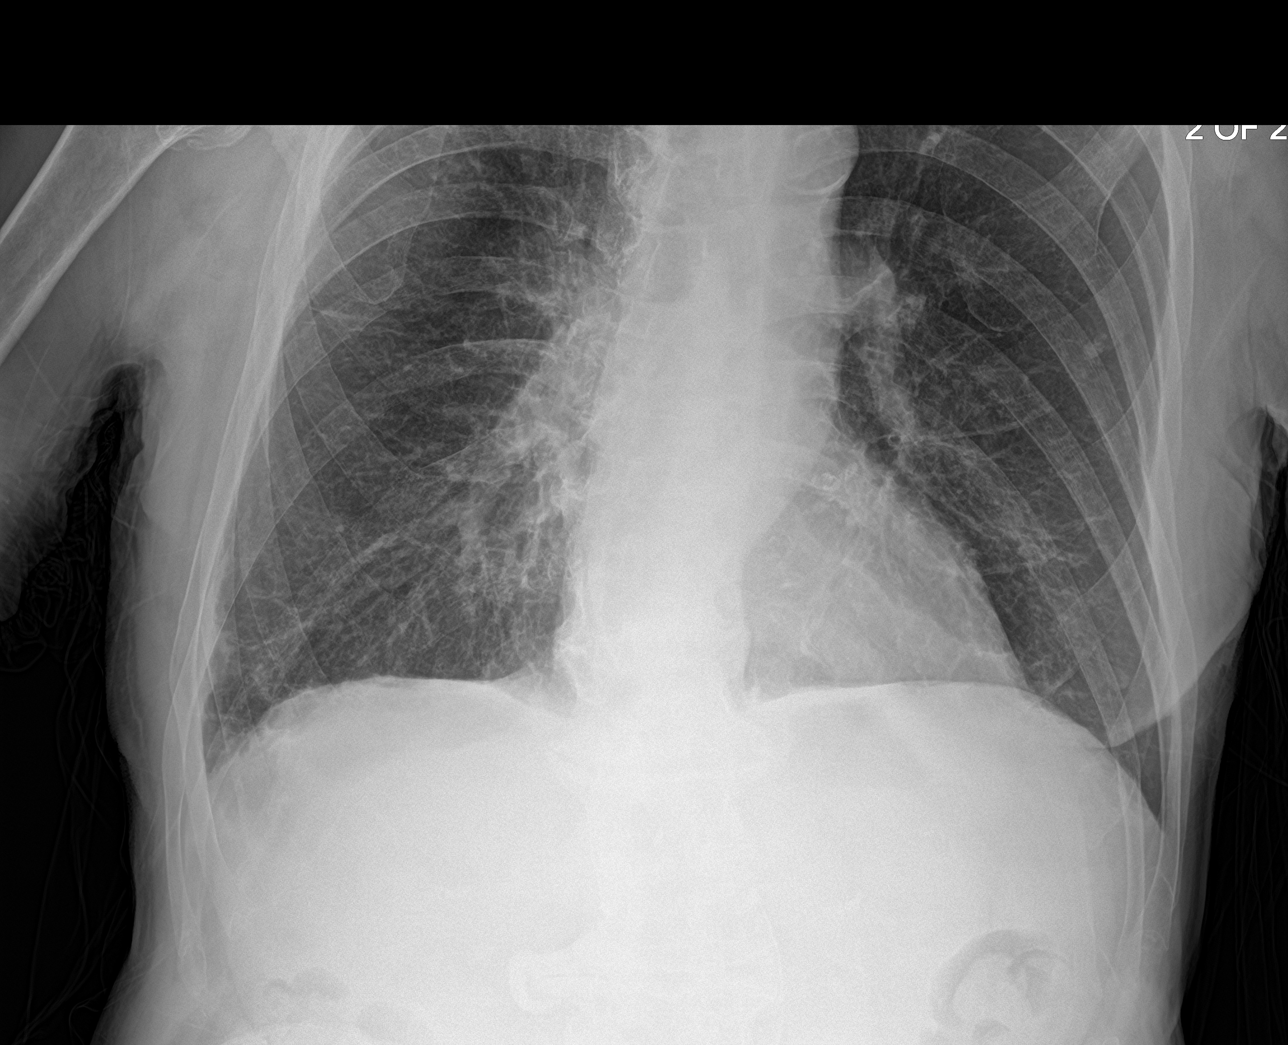

[3 of 3 positions shown; findings below may reference images not displayed]

FINDINGS: There is hyperinflation of the lungs compatible with COPD. Scarring
in the lung bases heart is normal size. Tortuosity of the thoracic
aorta with calcifications. No effusions or acute bony abnormality.
IMPRESSION: COPD/chronic changes.  No active disease.
